# Patient Record
Sex: Male | Born: 1965 | Race: White | Hispanic: No | Marital: Single | Smoking: Current every day smoker
Health system: Southern US, Community
[De-identification: ages and names within clinical notes are randomized; demographics above are authoritative.]

## PROBLEM LIST (undated history)

## (undated) DIAGNOSIS — L03116 Cellulitis of left lower limb: Secondary | ICD-10-CM

## (undated) DIAGNOSIS — F8189 Other developmental disorders of scholastic skills: Secondary | ICD-10-CM

## (undated) DIAGNOSIS — R0902 Hypoxemia: Secondary | ICD-10-CM

## (undated) DIAGNOSIS — M6281 Muscle weakness (generalized): Secondary | ICD-10-CM

## (undated) DIAGNOSIS — M4712 Other spondylosis with myelopathy, cervical region: Secondary | ICD-10-CM

## (undated) DIAGNOSIS — M79609 Pain in unspecified limb: Secondary | ICD-10-CM

## (undated) DIAGNOSIS — J9600 Acute respiratory failure, unspecified whether with hypoxia or hypercapnia: Secondary | ICD-10-CM

## (undated) DIAGNOSIS — H1030 Unspecified acute conjunctivitis, unspecified eye: Secondary | ICD-10-CM

## (undated) DIAGNOSIS — G4730 Sleep apnea, unspecified: Secondary | ICD-10-CM

## (undated) DIAGNOSIS — I10 Essential (primary) hypertension: Secondary | ICD-10-CM

## (undated) DIAGNOSIS — L0390 Cellulitis, unspecified: Secondary | ICD-10-CM

## (undated) DIAGNOSIS — R41841 Cognitive communication deficit: Secondary | ICD-10-CM

## (undated) DIAGNOSIS — H1045 Other chronic allergic conjunctivitis: Secondary | ICD-10-CM

## (undated) DIAGNOSIS — R4182 Altered mental status, unspecified: Secondary | ICD-10-CM

## (undated) DIAGNOSIS — K563 Gallstone ileus: Secondary | ICD-10-CM

## (undated) DIAGNOSIS — E860 Dehydration: Secondary | ICD-10-CM

## (undated) DIAGNOSIS — E872 Acidosis, unspecified: Secondary | ICD-10-CM

## (undated) DIAGNOSIS — E539 Vitamin B deficiency, unspecified: Secondary | ICD-10-CM

## (undated) DIAGNOSIS — F89 Unspecified disorder of psychological development: Secondary | ICD-10-CM

## (undated) DIAGNOSIS — M479 Spondylosis, unspecified: Secondary | ICD-10-CM

## (undated) DIAGNOSIS — F17200 Nicotine dependence, unspecified, uncomplicated: Secondary | ICD-10-CM

## (undated) DIAGNOSIS — M47817 Spondylosis without myelopathy or radiculopathy, lumbosacral region: Secondary | ICD-10-CM

## (undated) DIAGNOSIS — E870 Hyperosmolality and hypernatremia: Secondary | ICD-10-CM

## (undated) DIAGNOSIS — G4733 Obstructive sleep apnea (adult) (pediatric): Secondary | ICD-10-CM

## (undated) DIAGNOSIS — L0291 Cutaneous abscess, unspecified: Secondary | ICD-10-CM

## (undated) DIAGNOSIS — J9601 Acute respiratory failure with hypoxia: Secondary | ICD-10-CM

## (undated) DIAGNOSIS — J449 Chronic obstructive pulmonary disease, unspecified: Secondary | ICD-10-CM

## (undated) DIAGNOSIS — G9340 Encephalopathy, unspecified: Secondary | ICD-10-CM

## (undated) HISTORY — DX: Essential (primary) hypertension: I10

## (undated) HISTORY — DX: Dehydration: E86.0

## (undated) HISTORY — DX: Vitamin B deficiency, unspecified: E53.9

## (undated) HISTORY — DX: Gallstone ileus: K56.3

## (undated) HISTORY — DX: Acidosis: E87.2

## (undated) HISTORY — DX: Hypoxemia: R09.02

## (undated) HISTORY — DX: Pain in unspecified limb: M79.609

## (undated) HISTORY — DX: Other chronic allergic conjunctivitis: H10.45

## (undated) HISTORY — DX: Cutaneous abscess, unspecified: L02.91

## (undated) HISTORY — DX: Encephalopathy, unspecified: G93.40

## (undated) HISTORY — DX: Muscle weakness (generalized): M62.81

## (undated) HISTORY — DX: Other developmental disorders of scholastic skills: F81.89

## (undated) HISTORY — DX: Unspecified acute conjunctivitis, unspecified eye: H10.30

## (undated) HISTORY — DX: Cellulitis, unspecified: L03.90

## (undated) HISTORY — DX: Hyperosmolality and hypernatremia: E87.0

## (undated) HISTORY — DX: Acute respiratory failure, unspecified whether with hypoxia or hypercapnia: J96.00

## (undated) HISTORY — DX: Nicotine dependence, unspecified, uncomplicated: F17.200

## (undated) HISTORY — DX: Acidosis, unspecified: E87.20

## (undated) HISTORY — DX: Cognitive communication deficit: R41.841

## (undated) HISTORY — DX: Altered mental status, unspecified: R41.82

## (undated) HISTORY — PX: EYE SURGERY: SHX253

---

## 2012-09-18 ENCOUNTER — Inpatient Hospital Stay (HOSPITAL_COMMUNITY)
Admission: EM | Admit: 2012-09-18 | Discharge: 2012-09-25 | DRG: 190 | Disposition: A | Discharge status: Skilled Nursing Facility | Payer: Medicaid Other | Attending: Internal Medicine | Admitting: Internal Medicine

## 2012-09-18 ENCOUNTER — Encounter (HOSPITAL_COMMUNITY): Payer: Self-pay | Admitting: Emergency Medicine

## 2012-09-18 DIAGNOSIS — I10 Essential (primary) hypertension: Secondary | ICD-10-CM | POA: Diagnosis present

## 2012-09-18 DIAGNOSIS — L03116 Cellulitis of left lower limb: Secondary | ICD-10-CM

## 2012-09-18 DIAGNOSIS — J9601 Acute respiratory failure with hypoxia: Secondary | ICD-10-CM

## 2012-09-18 DIAGNOSIS — L02419 Cutaneous abscess of limb, unspecified: Secondary | ICD-10-CM | POA: Diagnosis present

## 2012-09-18 DIAGNOSIS — J449 Chronic obstructive pulmonary disease, unspecified: Principal | ICD-10-CM | POA: Diagnosis present

## 2012-09-18 DIAGNOSIS — F17200 Nicotine dependence, unspecified, uncomplicated: Secondary | ICD-10-CM | POA: Diagnosis present

## 2012-09-18 DIAGNOSIS — K8020 Calculus of gallbladder without cholecystitis without obstruction: Secondary | ICD-10-CM | POA: Diagnosis present

## 2012-09-18 DIAGNOSIS — E870 Hyperosmolality and hypernatremia: Secondary | ICD-10-CM | POA: Diagnosis present

## 2012-09-18 DIAGNOSIS — F89 Unspecified disorder of psychological development: Secondary | ICD-10-CM

## 2012-09-18 DIAGNOSIS — H10029 Other mucopurulent conjunctivitis, unspecified eye: Secondary | ICD-10-CM | POA: Diagnosis present

## 2012-09-18 DIAGNOSIS — E872 Acidosis: Secondary | ICD-10-CM

## 2012-09-18 DIAGNOSIS — J4489 Other specified chronic obstructive pulmonary disease: Principal | ICD-10-CM | POA: Diagnosis present

## 2012-09-18 DIAGNOSIS — F88 Other disorders of psychological development: Secondary | ICD-10-CM | POA: Diagnosis present

## 2012-09-18 DIAGNOSIS — M79605 Pain in left leg: Secondary | ICD-10-CM

## 2012-09-18 DIAGNOSIS — R4182 Altered mental status, unspecified: Secondary | ICD-10-CM | POA: Diagnosis present

## 2012-09-18 DIAGNOSIS — J9600 Acute respiratory failure, unspecified whether with hypoxia or hypercapnia: Secondary | ICD-10-CM | POA: Diagnosis present

## 2012-09-18 DIAGNOSIS — G9340 Encephalopathy, unspecified: Secondary | ICD-10-CM | POA: Diagnosis present

## 2012-09-18 DIAGNOSIS — R0689 Other abnormalities of breathing: Secondary | ICD-10-CM

## 2012-09-18 DIAGNOSIS — D7589 Other specified diseases of blood and blood-forming organs: Secondary | ICD-10-CM | POA: Diagnosis present

## 2012-09-18 DIAGNOSIS — IMO0002 Reserved for concepts with insufficient information to code with codable children: Secondary | ICD-10-CM

## 2012-09-18 DIAGNOSIS — G4733 Obstructive sleep apnea (adult) (pediatric): Secondary | ICD-10-CM | POA: Diagnosis present

## 2012-09-18 NOTE — ED Notes (Signed)
[facility] presents with a 47 yo male from home with left leg pain.  [facility] reports pt has +5 pitting edema in left leg and pt. Hit leg 3 to 4 weeks ago.  No medical hx./surgical hx. Bilateral eye surgery in 20s.

## 2012-09-19 ENCOUNTER — Encounter (HOSPITAL_COMMUNITY): Payer: Self-pay | Admitting: Radiology

## 2012-09-19 ENCOUNTER — Emergency Department (HOSPITAL_COMMUNITY): Payer: Medicaid Other

## 2012-09-19 LAB — CBC
HCT: 48.7 % (ref 39.0–52.0)
Hemoglobin: 15.7 g/dL (ref 13.0–17.0)
RBC: 4.65 MIL/uL (ref 4.22–5.81)
RDW: 14.2 % (ref 11.5–15.5)
WBC: 10.5 10*3/uL (ref 4.0–10.5)

## 2012-09-19 LAB — BASIC METABOLIC PANEL
BUN: 14 mg/dL (ref 6–23)
CO2: 32 mEq/L (ref 19–32)
Chloride: 109 mEq/L (ref 96–112)
GFR calc non Af Amer: 74 mL/min — ABNORMAL LOW (ref >90)
Glucose, Bld: 107 mg/dL — ABNORMAL HIGH (ref 70–99)
Potassium: 4.6 mEq/L (ref 3.5–5.1)
Sodium: 146 mEq/L — ABNORMAL HIGH (ref 135–145)

## 2012-09-19 LAB — BLOOD GAS, ARTERIAL
Acid-Base Excess: 4.2 mmol/L — ABNORMAL HIGH (ref 0.0–2.0)
Bicarbonate: 30.7 mEq/L — ABNORMAL HIGH (ref 20.0–24.0)
Patient temperature: 98.6
pO2, Arterial: 74.9 mmHg — ABNORMAL LOW (ref 80.0–100.0)

## 2012-09-19 LAB — CBC WITH DIFFERENTIAL/PLATELET
Eosinophils Absolute: 0.1 10*3/uL (ref 0.0–0.7)
Hemoglobin: 15.3 g/dL (ref 13.0–17.0)
Lymphocytes Relative: 25 % (ref 12–46)
Lymphs Abs: 2.4 10*3/uL (ref 0.7–4.0)
MCH: 33.1 pg (ref 26.0–34.0)
Monocytes Relative: 8 % (ref 3–12)
Neutro Abs: 6.5 10*3/uL (ref 1.7–7.7)
Neutrophils Relative %: 66 % (ref 43–77)
Platelets: 224 10*3/uL (ref 150–400)
RBC: 4.62 MIL/uL (ref 4.22–5.81)
WBC: 9.9 10*3/uL (ref 4.0–10.5)

## 2012-09-19 LAB — POCT I-STAT 3, ART BLOOD GAS (G3+)
Acid-Base Excess: 2 mmol/L (ref 0.0–2.0)
O2 Saturation: 93 %
TCO2: 34 mmol/L (ref 0–100)
pCO2 arterial: 72.5 mmHg (ref 35.0–45.0)
pO2, Arterial: 81 mmHg (ref 80.0–100.0)

## 2012-09-19 LAB — URINALYSIS, ROUTINE W REFLEX MICROSCOPIC
Glucose, UA: NEGATIVE mg/dL
Leukocytes, UA: NEGATIVE
Protein, ur: NEGATIVE mg/dL
Specific Gravity, Urine: 1.036 — ABNORMAL HIGH (ref 1.005–1.030)

## 2012-09-19 LAB — URINE MICROSCOPIC-ADD ON

## 2012-09-19 LAB — RAPID URINE DRUG SCREEN, HOSP PERFORMED
Amphetamines: NOT DETECTED
Cocaine: NOT DETECTED
Opiates: NOT DETECTED

## 2012-09-19 LAB — CREATININE, SERUM
GFR calc Af Amer: 80 mL/min — ABNORMAL LOW (ref >90)
GFR calc non Af Amer: 69 mL/min — ABNORMAL LOW (ref >90)

## 2012-09-19 MED ORDER — SODIUM CHLORIDE 0.9 % IJ SOLN
3.0000 mL | Freq: Two times a day (BID) | INTRAMUSCULAR | Status: DC
  Administered 2012-09-19 – 2012-09-24 (×9): 3 mL via INTRAVENOUS

## 2012-09-19 MED ORDER — HEPARIN SODIUM (PORCINE) 5000 UNIT/ML IJ SOLN
5000.0000 [IU] | Freq: Three times a day (TID) | INTRAMUSCULAR | Status: DC
  Administered 2012-09-19 – 2012-09-25 (×18): 5000 [IU] via SUBCUTANEOUS
  Filled 2012-09-19 (×23): qty 1

## 2012-09-19 MED ORDER — ONDANSETRON HCL 4 MG/2ML IJ SOLN
INTRAMUSCULAR | Status: AC
  Filled 2012-09-19: qty 2

## 2012-09-19 MED ORDER — IPRATROPIUM BROMIDE 0.02 % IN SOLN: 0.5000 mg | RESPIRATORY_TRACT | Status: DC | PRN

## 2012-09-19 MED ORDER — CLINDAMYCIN PHOSPHATE 900 MG/50ML IV SOLN
900.0000 mg | Freq: Once | INTRAVENOUS | Status: AC
  Administered 2012-09-19: 900 mg via INTRAVENOUS
  Filled 2012-09-19: qty 50

## 2012-09-19 MED ORDER — IPRATROPIUM BROMIDE 0.02 % IN SOLN
0.5000 mg | RESPIRATORY_TRACT | Status: DC
  Administered 2012-09-19 (×4): 0.5 mg via RESPIRATORY_TRACT
  Filled 2012-09-19 (×4): qty 2.5

## 2012-09-19 MED ORDER — METHYLPREDNISOLONE SODIUM SUCC 125 MG IJ SOLR
60.0000 mg | INTRAMUSCULAR | Status: DC
  Administered 2012-09-19: 60 mg via INTRAVENOUS
  Filled 2012-09-19: qty 0.96
  Filled 2012-09-19: qty 2
  Filled 2012-09-19: qty 0.96

## 2012-09-19 MED ORDER — IPRATROPIUM BROMIDE 0.02 % IN SOLN
0.5000 mg | Freq: Four times a day (QID) | RESPIRATORY_TRACT | Status: DC
  Administered 2012-09-20 – 2012-09-21 (×6): 0.5 mg via RESPIRATORY_TRACT
  Filled 2012-09-19 (×6): qty 2.5

## 2012-09-19 MED ORDER — ONDANSETRON HCL 4 MG/2ML IJ SOLN: 4.0000 mg | Freq: Four times a day (QID) | INTRAMUSCULAR | Status: DC | PRN

## 2012-09-19 MED ORDER — VANCOMYCIN HCL 10 G IV SOLR
1500.0000 mg | Freq: Two times a day (BID) | INTRAVENOUS | Status: DC
  Administered 2012-09-19 – 2012-09-20 (×3): 1500 mg via INTRAVENOUS
  Filled 2012-09-19 (×5): qty 1500

## 2012-09-19 MED ORDER — ALBUTEROL SULFATE (5 MG/ML) 0.5% IN NEBU
2.5000 mg | INHALATION_SOLUTION | RESPIRATORY_TRACT | Status: DC
  Administered 2012-09-19 (×4): 2.5 mg via RESPIRATORY_TRACT
  Filled 2012-09-19 (×4): qty 0.5

## 2012-09-19 MED ORDER — HYDROMORPHONE HCL PF 1 MG/ML IJ SOLN
INTRAMUSCULAR | Status: AC
  Filled 2012-09-19: qty 1

## 2012-09-19 MED ORDER — ONDANSETRON HCL 4 MG PO TABS: 4.0000 mg | ORAL_TABLET | Freq: Four times a day (QID) | ORAL | Status: DC | PRN

## 2012-09-19 MED ORDER — ALBUTEROL SULFATE (5 MG/ML) 0.5% IN NEBU: 2.5000 mg | INHALATION_SOLUTION | RESPIRATORY_TRACT | Status: DC | PRN

## 2012-09-19 MED ORDER — IOHEXOL 350 MG/ML SOLN: 100.0000 mL | Freq: Once | INTRAVENOUS | Status: DC | PRN

## 2012-09-19 MED ORDER — ONDANSETRON HCL 4 MG/2ML IJ SOLN: 4.0000 mg | Freq: Three times a day (TID) | INTRAMUSCULAR | Status: DC | PRN

## 2012-09-19 MED ORDER — IOHEXOL 350 MG/ML SOLN
100.0000 mL | Freq: Once | INTRAVENOUS | Status: AC | PRN
  Administered 2012-09-19: 100 mL via INTRAVENOUS

## 2012-09-19 MED ORDER — PNEUMOCOCCAL VAC POLYVALENT 25 MCG/0.5ML IJ INJ
0.5000 mL | INJECTION | INTRAMUSCULAR | Status: AC
  Filled 2012-09-19: qty 0.5

## 2012-09-19 MED ORDER — HYDROMORPHONE HCL PF 1 MG/ML IJ SOLN
0.5000 mg | Freq: Once | INTRAMUSCULAR | Status: AC
  Administered 2012-09-19: 0.5 mg via INTRAVENOUS
  Filled 2012-09-19: qty 1

## 2012-09-19 MED ORDER — ALBUTEROL SULFATE (5 MG/ML) 0.5% IN NEBU
2.5000 mg | INHALATION_SOLUTION | Freq: Four times a day (QID) | RESPIRATORY_TRACT | Status: DC
  Administered 2012-09-20 – 2012-09-21 (×6): 2.5 mg via RESPIRATORY_TRACT
  Filled 2012-09-19 (×6): qty 0.5

## 2012-09-19 MED ORDER — VANCOMYCIN HCL IN DEXTROSE 1-5 GM/200ML-% IV SOLN
1000.0000 mg | Freq: Once | INTRAVENOUS | Status: AC
  Administered 2012-09-19: 1000 mg via INTRAVENOUS
  Filled 2012-09-19: qty 200

## 2012-09-19 MED ORDER — SODIUM CHLORIDE 0.9 % IV SOLN
INTRAVENOUS | Status: DC
  Administered 2012-09-19: 1000 mL via INTRAVENOUS
  Administered 2012-09-19 – 2012-09-20 (×2): via INTRAVENOUS

## 2012-09-19 NOTE — ED Notes (Signed)
Vascular lab called and said they could not perform the test due to the patient not being cooperative.  I contacted Dr. Rhona Leavens and advised him of same.

## 2012-09-19 NOTE — ED Notes (Addendum)
Pt. To CT. This nurse to accompany

## 2012-09-19 NOTE — H&P (Signed)
[name] is an 47 y.o. male.   Chief Complaint: Unable to obtain as pt is obtunded HPI: Cannot obtain from the patient given his mental status. There is no prior history available on the system. The H and P is obtained per ED records. Briefly, the pt is a 47yo male who presents to the ED with complaints of left leg pain. Pt did receive multiple does of narcotics for pain. Pt was later noted to be more obtunded in the ED with tachycardia and desaturation to the 80's. CT head and chest were unremarkable. ABG noted to have pH of 7.2 with pCO2 of mid 70's.  History reviewed. No pertinent past medical history.  Past Surgical History  Procedure Laterality Date  . Eye surgery      History reviewed. No pertinent family history. Social History:  reports that he has been smoking.  He has never used smokeless tobacco. He reports that he does not drink alcohol or use illicit drugs.  Allergies: No Known Allergies   (Not in a hospital admission)  Results for orders placed during the hospital encounter of 09/18/12 (from the past 48 hour(s))  CBC WITH DIFFERENTIAL     Status: Abnormal   Collection Time    09/19/12  3:44 AM      Result Value Range   WBC 9.9  4.0 - 10.5 K/uL   RBC 4.62  4.22 - 5.81 MIL/uL   Hemoglobin 15.3  13.0 - 17.0 g/dL   HCT 40.9  81.1 - 91.4 %   MCV 103.5 (*) 78.0 - 100.0 fL   MCH 33.1  26.0 - 34.0 pg   MCHC 32.0  30.0 - 36.0 g/dL   RDW 78.2  95.6 - 21.3 %   Platelets 224  150 - 400 K/uL   Neutrophils Relative % 66  43 - 77 %   Neutro Abs 6.5  1.7 - 7.7 K/uL   Lymphocytes Relative 25  12 - 46 %   Lymphs Abs 2.4  0.7 - 4.0 K/uL   Monocytes Relative 8  3 - 12 %   Monocytes Absolute 0.8  0.1 - 1.0 K/uL   Eosinophils Relative 1  0 - 5 %   Eosinophils Absolute 0.1  0.0 - 0.7 K/uL   Basophils Relative 0  0 - 1 %   Basophils Absolute 0.0  0.0 - 0.1 K/uL  BASIC METABOLIC PANEL     Status: Abnormal   Collection Time    09/19/12  3:44 AM      Result Value Range    Sodium 146 (*) 135 - 145 mEq/L   Potassium 4.6  3.5 - 5.1 mEq/L   Chloride 109  96 - 112 mEq/L   CO2 32  19 - 32 mEq/L   Glucose, Bld 107 (*) 70 - 99 mg/dL   BUN 14  6 - 23 mg/dL   Creatinine, Ser 0.86  0.50 - 1.35 mg/dL   Calcium 9.1  8.4 - 57.8 mg/dL   GFR calc non Af Amer 74 (*) >90 mL/min   GFR calc Af Amer 85 (*) >90 mL/min   Comment:            The eGFR has been calculated     using the CKD EPI equation.     This calculation has not been     validated in all clinical     situations.     eGFR's persistently     <90 mL/min signify  possible Chronic Kidney Disease.  D-DIMER, QUANTITATIVE     Status: Abnormal   Collection Time    09/19/12  3:44 AM      Result Value Range   D-Dimer, Quant 1.46 (*) 0.00 - 0.48 ug/mL-FEU   Comment:            AT THE INHOUSE ESTABLISHED CUTOFF     VALUE OF 0.48 ug/mL FEU,     THIS ASSAY HAS BEEN DOCUMENTED     IN THE LITERATURE TO HAVE     A SENSITIVITY AND NEGATIVE     PREDICTIVE VALUE OF AT LEAST     98 TO 99%.  THE TEST RESULT     SHOULD BE CORRELATED WITH     AN ASSESSMENT OF THE CLINICAL     PROBABILITY OF DVT / VTE.  POCT I-STAT 3, BLOOD GAS (G3+)     Status: Abnormal   Collection Time    09/19/12  8:58 AM      Result Value Range   pH, Arterial 7.255 (*) 7.350 - 7.450   pCO2 arterial 72.5 (*) 35.0 - 45.0 mmHg   pO2, Arterial 81.0  80.0 - 100.0 mmHg   Bicarbonate 32.1 (*) 20.0 - 24.0 mEq/L   TCO2 34  0 - 100 mmol/L   O2 Saturation 93.0     Acid-Base Excess 2.0  0.0 - 2.0 mmol/L   Patient temperature 98.6 F     Collection site RADIAL, ALLEN'S TEST ACCEPTABLE     Drawn by Operator     Sample type ARTERIAL     Comment NOTIFIED PHYSICIAN     Ct Head Wo Contrast  09/19/2012   *RADIOLOGY REPORT*  Clinical Data: Pain point pupils, right pupil dilatation, history of eye surgery  CT HEAD WITHOUT CONTRAST  Technique:  Contiguous axial images were obtained from the base of the skull through the vertex without contrast.  Comparison:  None.  Findings:  Examination is degraded secondary to patient motion artifact necessitating the acquisition of additional images.  The gray-white differentiation appears preserved.  No CT evidence of acute large territory infarct.  No intraparenchymal or extra- axial mass or hemorrhage.  Normal size and configuration of the ventricles and basilar cisterns.  Incidental note is made of a cavum septum pellucidum.  No midline shift.  There is underpneumatization of the bilateral frontal sinuses.  The mucosal polyp is seen within the left maxillary sinus. The remaining paranasal sinuses and mastoid air cells are normally aerated.  No air fluid levels.  The regional soft tissues are normal.  No displaced calvarial fracture.  Post bilateral cataract surgery.  IMPRESSION:  Degraded examination without acute intracranial process.   Original Report Authenticated By: Tacey Ruiz, MD   Ct Angio Chest Pe W/cm &/or Wo Cm  09/19/2012   *RADIOLOGY REPORT*  Clinical Data: Tachycardia, hypoxia, elevated D-dimer, evaluate for pulmonary embolism  CT ANGIOGRAPHY CHEST  Technique:  Multidetector CT imaging of the chest using the standard protocol during bolus administration of intravenous contrast. Multiplanar reconstructed images including MIPs were obtained and reviewed to evaluate the vascular anatomy.  Contrast: OMNIPAQUE IOHEXOL 350 MG/ML SOLN  Comparison: None.  Vascular Findings:  There is suboptimal opacification of the pulmonary arterial system of the main pulmonary artery measuring only 227 HU. Given this limitation, there are no discrete filling defects within the pulmonary arterial tree to the level of the bilateral sub segmental pulmonary arteries.  Evaluation of the subsegmental pulmonary arteries is degraded secondary to  a combination of suboptimal vessel opacification, patient body habitus and respiratory artifact.  Normal caliber of the main pulmonary artery.  Borderline cardiomegaly.  No pericardial effusion.   Normal caliber of the thoracic aorta.  Bovine configuration of the aortic arch. No definite periaortic stranding.  Nonvascular findings:  Evaluation of the pulmonary parenchyma is degraded secondary to patient respiratory artifact. There is minimal segmental atelectasis involving the medial and basilar segments of the bilateral lower lobes.  There is minimal linear heterogeneous and ground-glass opacities most conspicuous within the superior segment of the right lower lobe anterior segment of the lingula favored to represent atelectasis. No focal airspace opacities.  No pleural effusion or pneumothorax.  There is mild thickening of the segmental bronchi, most conspicuous within the bilateral lower lobes. Given limitations of the examination, there are no discrete underlying pulmonary nodules.  There is a solitary borderline enlarged mediastinal lymph node adjacent to the aortic arch measuring approximately 1.1 cm in grade short axis diameter (image 22, series nine). There is a shoddy bilateral hilar lymph node which measures approximately 9 mm in diameter (right hilar node - image 97, series 11; left infrahilar node - image 102) which are not enlarged by CT criteria.  Limited early arterial phase evaluation of the upper abdomen demonstrates a sub centimeter hypoattenuating lesion within the dome of the left lobe of the liver (image 57, series nine) which in the absence of a known primary malignancy likely represents a hepatic cyst.  A punctate (2-3 mm) gallstone is seen within the neck of a mildly distended but otherwise normal appearing gallbladder (image 64) though note, the entirety of the gallbladder is not imaged on this examination.  No acute or aggressive osseous abnormalities.  IMPRESSION:  1.  Multifactorial examination degradation without evidence of pulmonary embolism to the level of the bilateral subsegmental pulmonary arteries. 2.  Mild thickening of the segmental bronchi, is within the bilateral lower  lobes, nonspecific but could be seen in the setting of airways disease/bronchitis.  3.  A punctate gallstone is noted within the neck of a mildly distended but otherwise normal-appearing gallbladder, note however, the entirety of the gallbladder was not imaged on this examination. If there is concern for acute cholecystitis, further evaluation with gallbladder ultrasound is recommended.   Original Report Authenticated By: Tacey Ruiz, MD    Review of Systems  Unable to perform ROS: mental acuity    Blood pressure 136/123, pulse 119, temperature 98.4 F (36.9 C), temperature source Oral, resp. rate 12, height 5\' 2"  (1.575 m), weight 90.719 kg (200 lb), SpO2 98.00%. Physical Exam   General: Pt obtunded, difficult to arouse Chest: decreased breath sounds B with expiratory wheezing CVS: Tachycardia, S1, S2 Neuro: 5/5 strength throughout, CN 2-12 grossly intact Skin: normal Extr: B LE edema, L>R Psych: unable to determine  Assessment/Plan 1) Hypercarbia: RT consulted. Will initiate BiPAP as tolerated. Will emperically add scheduled nebs and start solumedrol given wheezing on exam. 2) Bronchitis: Will start scheduled nebs and solumedrol. Cont O2 as needed and wean as tolerated 3) Elevated D-dimer: unclear etiology. Scans in ED are neg for PE. Will order LE dopplers to r/o DVT. 4) Leg pain: Unclear etiology. As above, will obtain LE dopplers to r/o DVT. Would avoid narcotics for now given hypercarbic state. 5) DVT prophylaxis: Heparin per protocol 6) Code status: unable to determine. For now, pt is FULL CODE.  Iman Orourke K 09/19/2012, 9:42 AM

## 2012-09-19 NOTE — ED Provider Notes (Signed)
Patient care assumed from [facility], [place] positive shift change. Patient presents for left lower extremity pain, swelling, and erythema times one week. Patient denied CP, SOB, and hx of blood clots. Physical exam completed by Jaci Carrel, PA-C significant for 2+ pitting edema bilaterally; patient neurovascularly intact. CBC, BMP, and d-dimer ordered.  D-dimer elevated to 1.46. Duplex ultrasound of the left lower extremity ordered for further evaluation.  Patient's mental status has become altered within the last 30 minutes. He is tachycardic to 114 and hypoxic to 87% on room air. Currently satting 94% on 2 L nasal cannula. CT angio chest ordered to evaluate for pulmonary embolism. Patient currently has a rectal temperature of 99.5. CT head without contrast ordered to further assess AMS.  Patient signed out to [facility] at shift change for dispo.   Results for orders placed during the hospital encounter of 09/18/12  CBC WITH DIFFERENTIAL      Result Value Range   WBC 9.9  4.0 - 10.5 K/uL   RBC 4.62  4.22 - 5.81 MIL/uL   Hemoglobin 15.3  13.0 - 17.0 g/dL   HCT 16.1  09.6 - 04.5 %   MCV 103.5 (*) 78.0 - 100.0 fL   MCH 33.1  26.0 - 34.0 pg   MCHC 32.0  30.0 - 36.0 g/dL   RDW 40.9  81.1 - 91.4 %   Platelets 224  150 - 400 K/uL   Neutrophils Relative % 66  43 - 77 %   Neutro Abs 6.5  1.7 - 7.7 K/uL   Lymphocytes Relative 25  12 - 46 %   Lymphs Abs 2.4  0.7 - 4.0 K/uL   Monocytes Relative 8  3 - 12 %   Monocytes Absolute 0.8  0.1 - 1.0 K/uL   Eosinophils Relative 1  0 - 5 %   Eosinophils Absolute 0.1  0.0 - 0.7 K/uL   Basophils Relative 0  0 - 1 %   Basophils Absolute 0.0  0.0 - 0.1 K/uL  BASIC METABOLIC PANEL      Result Value Range   Sodium 146 (*) 135 - 145 mEq/L   Potassium 4.6  3.5 - 5.1 mEq/L   Chloride 109  96 - 112 mEq/L   CO2 32  19 - 32 mEq/L   Glucose, Bld 107 (*) 70 - 99 mg/dL   BUN 14  6 - 23 mg/dL   Creatinine, Ser 7.82  0.50 - 1.35 mg/dL   Calcium 9.1  8.4 -  95.6 mg/dL   GFR calc non Af Amer 74 (*) >90 mL/min   GFR calc Af Amer 85 (*) >90 mL/min  D-DIMER, QUANTITATIVE      Result Value Range   D-Dimer, Quant 1.46 (*) 0.00 - 0.48 ug/mL-FEU     Antony Madura, PA-C 09/19/12 [phone]

## 2012-09-19 NOTE — ED Notes (Signed)
This nurse went to talk to PA about patient status again. No change in patient status since last time this nurse checked on him.

## 2012-09-19 NOTE — ED Notes (Signed)
Pt. Continues to try to pull out IV and off oxygen. Pt. Will say "okay I understand" but then continue to pull at wires. Pt. Will open eyes for short period of time and make observations such as "It' 6 o'clock", and then become lethargic and confused.

## 2012-09-19 NOTE — ED Provider Notes (Signed)
7:14 AM Patient signed out to me by Antony Madura, PA-C. Patient presents with leg swelling and altered mental status. Patient is being evaluated for PE due to elevated d-dimer. Patient is pending a CT angio chest, CT head, and venous doppler of lower extremity. When the tests result, patient will be admitted.   10:45 AM CT of head and chest unremarkable for acute changes. Patient admitted to Dr. Rhona Leavens for altered mental status and lower extremity cellulitis.   Emilia Beck, PA-C 09/19/12 1045

## 2012-09-19 NOTE — ED Notes (Signed)
This nurse went in to assess pt. Pt. Very lethargic, hard to keep awake. Pinpoint pupils, right pupil more dilated then left. PA notified of pt status.

## 2012-09-19 NOTE — Progress Notes (Signed)
Pt transported on BiPAP 12/6, RR 8, 40%. Vitals WNL. No complications noted. RT Will monitor.

## 2012-09-19 NOTE — Progress Notes (Signed)
Pt admitted from ED, confused, on BiPap, condom cath applied, pt not following commands, bed alarm set

## 2012-09-19 NOTE — Progress Notes (Signed)
MD took pt of bipap for a break. Awaiting ABG to see if pt can stay off. As of now, pt on 4L [place] and tolerating well.

## 2012-09-19 NOTE — Progress Notes (Signed)
ANTIBIOTIC CONSULT NOTE - INITIAL  Pharmacy Consult for Vancomycin Indication: LLE cellulitis  No Known Allergies  Patient Measurements: Height: 5\' 2"  (157.5 cm) Weight: 200 lb (90.719 kg) IBW/kg (Calculated) : 54.6  Vital Signs: Temp: 98.6 F (37 C) (05/14 1639) Temp src: Axillary (05/14 1639) BP: 121/65 mmHg (05/14 1250) Pulse Rate: 119 (05/14 1342) Intake/Output from previous day:   Intake/Output from this shift: Total I/O In: 240 [P.O.:240] Out: -   Labs:  Recent Labs  09/19/12 0344 09/19/12 0915  WBC 9.9 10.5  HGB 15.3 15.7  PLT 224 233  CREATININE 1.17 1.23   Estimated Creatinine Clearance: 74 ml/min (by C-G formula based on Cr of 1.23). No results found for this basename: VANCOTROUGH, Leodis Binet, VANCORANDOM, GENTTROUGH, GENTPEAK, GENTRANDOM, TOBRATROUGH, TOBRAPEAK, TOBRARND, AMIKACINPEAK, AMIKACINTROU, AMIKACIN,  in the last 72 hours   Microbiology: Recent Results (from the past 720 hour(s))  MRSA PCR SCREENING     Status: None   Collection Time    09/19/12 11:46 AM      Result Value Range Status   MRSA by PCR NEGATIVE  NEGATIVE Final   Comment:            The GeneXpert MRSA Assay (FDA     approved for NASAL specimens     only), is one component of a     comprehensive MRSA colonization     surveillance program. It is not     intended to diagnose MRSA     infection nor to guide or     monitor treatment for     MRSA infections.    Medical History: History reviewed. No pertinent past medical history.  Medications:  No prescriptions prior to admission   Assessment: 47 y/o male patient admitted with left leg pain requiring vancomycin for cellulitis. Received 1g vanc at 0400 today in ED.  Goal of Therapy:  Vancomycin trough level 10-15 mcg/ml  Plan:  Vancomycin 1500mg  IV q12h and monitor renal function. Measure antibiotic drug levels at steady state Follow up culture results  Verlene Mayer, PharmD, BCPS Pager [phone] 09/19/2012,5:20  PM

## 2012-09-19 NOTE — Progress Notes (Signed)
VASCULAR LAB PRELIMINARY  PRELIMINARY  PRELIMINARY  PRELIMINARY  Left lower extremity venous duplex attempted.    Preliminary report:  Left lower extremity venous duplex attempted. This is totally inconclusive. Patient unable to tolerate the exam. Patient constantly bending the knee and trying to turn on his side. Importance of the test was discussed. Patient states he is sorry, but he just can't complete the exam after just getting started.  Vernida Mcnicholas, RVS 09/19/2012, 9:48 AM

## 2012-09-19 NOTE — Progress Notes (Signed)
Unit CM UR Completed by [facility] ED CM  W. Sharmarke Cicio RN  

## 2012-09-19 NOTE — Progress Notes (Signed)
Pt HR decreased into 40-50's, pt BP 110/48. HR went back up into 110's. NP Craige Cotta at bedside and aware, no new orders at this time, will continue to monitor.

## 2012-09-19 NOTE — ED Notes (Signed)
CT called and advised that patient has IV access in L AC.

## 2012-09-19 NOTE — ED Notes (Signed)
Walked into patient room. Pt. Still lethargic. Pt. Drooling with blood in it, suctioned patient mouth. Pt. Mental status alerted, unable to answer simple questions. Ammonia popped to help wake pt up. Pt. Sat up in bed saying "I need to pee", laid back in bed and became lethargic again. Pt. Uncooperative and confused with this nurse. Tresa Endo, [place] and Dr. Read Drivers at bedside. Rectal temp 99.5. Pt. Denying any complaints.

## 2012-09-19 NOTE — ED Notes (Signed)
Updated PA on patient status and plan of care.

## 2012-09-20 ENCOUNTER — Inpatient Hospital Stay (HOSPITAL_COMMUNITY): Payer: Medicaid Other

## 2012-09-20 DIAGNOSIS — G4733 Obstructive sleep apnea (adult) (pediatric): Secondary | ICD-10-CM

## 2012-09-20 DIAGNOSIS — E870 Hyperosmolality and hypernatremia: Secondary | ICD-10-CM | POA: Diagnosis present

## 2012-09-20 DIAGNOSIS — R6250 Unspecified lack of expected normal physiological development in childhood: Secondary | ICD-10-CM

## 2012-09-20 DIAGNOSIS — E872 Acidosis: Secondary | ICD-10-CM | POA: Diagnosis present

## 2012-09-20 DIAGNOSIS — G9340 Encephalopathy, unspecified: Secondary | ICD-10-CM | POA: Diagnosis present

## 2012-09-20 DIAGNOSIS — R0689 Other abnormalities of breathing: Secondary | ICD-10-CM | POA: Diagnosis present

## 2012-09-20 DIAGNOSIS — F89 Unspecified disorder of psychological development: Secondary | ICD-10-CM | POA: Diagnosis present

## 2012-09-20 DIAGNOSIS — J9602 Acute respiratory failure with hypercapnia: Secondary | ICD-10-CM

## 2012-09-20 DIAGNOSIS — L03116 Cellulitis of left lower limb: Secondary | ICD-10-CM

## 2012-09-20 DIAGNOSIS — K8020 Calculus of gallbladder without cholecystitis without obstruction: Secondary | ICD-10-CM | POA: Diagnosis present

## 2012-09-20 DIAGNOSIS — M79609 Pain in unspecified limb: Secondary | ICD-10-CM

## 2012-09-20 DIAGNOSIS — J9600 Acute respiratory failure, unspecified whether with hypoxia or hypercapnia: Secondary | ICD-10-CM

## 2012-09-20 DIAGNOSIS — R4182 Altered mental status, unspecified: Secondary | ICD-10-CM | POA: Diagnosis present

## 2012-09-20 DIAGNOSIS — J9601 Acute respiratory failure with hypoxia: Secondary | ICD-10-CM | POA: Diagnosis present

## 2012-09-20 DIAGNOSIS — M79605 Pain in left leg: Secondary | ICD-10-CM | POA: Diagnosis present

## 2012-09-20 HISTORY — DX: Obstructive sleep apnea (adult) (pediatric): G47.33

## 2012-09-20 HISTORY — DX: Unspecified disorder of psychological development: F89

## 2012-09-20 HISTORY — DX: Acidosis: E87.2

## 2012-09-20 HISTORY — DX: Acute respiratory failure with hypercapnia: J96.02

## 2012-09-20 HISTORY — DX: Acute respiratory failure with hypoxia: J96.01

## 2012-09-20 HISTORY — DX: Cellulitis of left lower limb: L03.116

## 2012-09-20 LAB — COMPREHENSIVE METABOLIC PANEL
ALT: 24 U/L (ref 0–53)
Alkaline Phosphatase: 100 U/L (ref 39–117)
CO2: 33 mEq/L — ABNORMAL HIGH (ref 19–32)
Chloride: 113 mEq/L — ABNORMAL HIGH (ref 96–112)
GFR calc Af Amer: 86 mL/min — ABNORMAL LOW (ref >90)
GFR calc non Af Amer: 75 mL/min — ABNORMAL LOW (ref >90)
Glucose, Bld: 107 mg/dL — ABNORMAL HIGH (ref 70–99)
Potassium: 4.8 mEq/L (ref 3.5–5.1)
Sodium: 150 mEq/L — ABNORMAL HIGH (ref 135–145)
Total Bilirubin: 0.2 mg/dL — ABNORMAL LOW (ref 0.3–1.2)

## 2012-09-20 LAB — CBC WITH DIFFERENTIAL/PLATELET
Basophils Absolute: 0 10*3/uL (ref 0.0–0.1)
Basophils Relative: 0 % (ref 0–1)
Eosinophils Absolute: 0 10*3/uL (ref 0.0–0.7)
MCH: 33.2 pg (ref 26.0–34.0)
MCHC: 31.4 g/dL (ref 30.0–36.0)
Neutro Abs: 8.1 10*3/uL — ABNORMAL HIGH (ref 1.7–7.7)
Neutrophils Relative %: 80 % — ABNORMAL HIGH (ref 43–77)
RDW: 14.3 % (ref 11.5–15.5)

## 2012-09-20 LAB — BLOOD GAS, ARTERIAL
Bicarbonate: 31.9 mEq/L — ABNORMAL HIGH (ref 20.0–24.0)
Delivery systems: POSITIVE
FIO2: 0.4 %
O2 Saturation: 88.2 %
Patient temperature: 98.4
TCO2: 34.2 mmol/L (ref 0–100)
pH, Arterial: 7.252 — ABNORMAL LOW (ref 7.350–7.450)
pO2, Arterial: 58.2 mmHg — ABNORMAL LOW (ref 80.0–100.0)

## 2012-09-20 LAB — LACTIC ACID, PLASMA: Lactic Acid, Venous: 1.1 mmol/L (ref 0.5–2.2)

## 2012-09-20 MED ORDER — NALOXONE HCL 0.4 MG/ML IJ SOLN: 0.4000 mg | INTRAMUSCULAR | Status: DC | PRN

## 2012-09-20 MED ORDER — NALOXONE HCL 0.4 MG/ML IJ SOLN
INTRAMUSCULAR | Status: AC
  Administered 2012-09-20: 0.4 mg
  Filled 2012-09-20: qty 1

## 2012-09-20 MED ORDER — SODIUM CHLORIDE 0.45 % IV SOLN
INTRAVENOUS | Status: DC
  Administered 2012-09-20: 1000 mL via INTRAVENOUS
  Administered 2012-09-21: via INTRAVENOUS

## 2012-09-20 MED ORDER — HYDROMORPHONE HCL PF 1 MG/ML IJ SOLN: 0.5000 mg | INTRAMUSCULAR | Status: DC | PRN

## 2012-09-20 MED ORDER — FOLIC ACID 1 MG PO TABS
1.0000 mg | ORAL_TABLET | Freq: Every day | ORAL | Status: DC
  Administered 2012-09-20 – 2012-09-25 (×6): 1 mg via ORAL
  Filled 2012-09-20 (×6): qty 1

## 2012-09-20 NOTE — Progress Notes (Signed)
Pt ABG done 09/20/2012 at 03:49. Pt on BiPAP IPAP 12 EPAP 6 at 40%.  PH 7.235 CO2 75.1 PO2 91.0 HCO3 30.7  RN notified at 04:05 Lacey Hitt -RBV-

## 2012-09-20 NOTE — Progress Notes (Signed)
[facility] Progress Note [name] TEAM 1 - Stepdown/ICU [name] Waltz ZOX:[id number] DOB: July 21, 1965 DOA: 09/18/2012 PCP: No primary provider on file.  Brief narrative: 47 year old male patient with only known medical history of hypertension. He presented to the emergency department because of swelling redness and pain in left leg. After arrival he received narcotic pain medications. Later he was noted to be more tender than the ER with tachycardia and oxygen desaturations to 80%. CT of the head and chest were unremarkable for any acute problems. ABG demonstrated respiratory acidosis with a pH of 7.2 and a PCO2 in the mid 70s. He was admitted to the step down unit for BiPAP was initiated. After admission family communicated with the staff and apparently patient's mother recently died and family has a send care for this patient. He was also scheduled for an outpatient polysomnogram due to concerns of possible sleep apnea.  Assessment/Plan: Active Problems:   Acute respiratory failure with hypoxia and Hypercapnia due to :  A) Suspected OSA (obstructive sleep apnea)  B)  Narcotic induced mental alteration -was given Narcan in ER but hypercapnia persisted- after add'l Narcan 3300 pt MS and resp effort improved significantly -cont BiPAP and has been weaned to 3 L -use CPAP at HS -appreciate PCCM assistance- so far has avoided intubation -needs PSG after dc and likely will need to be dc'd with CPAP at HS -avoid sedating meds    Acute respiratory acidosis -due to hypercapnia as above- since awake will not pursue add'l ABG's    Encephalopathy acute -due to narcs then hypercarbia- resolving    Left leg pain/Cellulitis of left lower extremity  -2nd venous duplex shows no DVT and CT Chest shows no PE -PCT WNL so likely mild superficial cellulitis -cont empiric Clinda and Vanco but consider narrow to monotherapy -Followup echo to ensure no cardiac etiology to edema   Dehydration with hypernatremia  -cont IVF and agree with change fluids to 1/2 NS- follow lytes    Elevated MCV -check anemia panel and TSH    ? Developmental disability -keep in touch with family-was liviing alone in apt pre admit - d/w [name] SW who will clarify if pt truly carries DD dx and what their desires are for dc planning -family had requested ? dc to "ALF" but group home may be more appropriate for this pt    Gallstone (impacted) -LFT's normal and no apparent abd pain- follow   DVT prophylaxis: SCDs Code Status: Full Family Communication: Patient only noting staff and social worker have spoken to the family and patient recently admitted within the past 12 hours and admitting physician has ordered a spoken to family Disposition Plan: Stepdown Isolation: None  Consultants: Pulmonary critical care medicine  Procedures: Portable abdominal ultrasound: Exam limited by bowel gas and body habitus, distended gallbladder without gallstones  Lower extremity venous duplex: No DVT in either extremity  2D echocardiogram: Pending  Antibiotics: Vancomycin 5/15 >>> Clindamycin 5/15 >>>  HPI/Subjective: Earlier this a.m. patient was sedated but easily awakened and able to answer questions appropriately given his apparent developmental delay. After Narcan administered patient completely alert with improved respiratory effort. No complaints from the patient except he is thirsty and would like to eat   Objective: Blood pressure 134/75, pulse 108, temperature 98 F (36.7 C), temperature source Oral, resp. rate 19, height 5\' 2"  (1.575 m), weight 90.719 kg (200 lb), SpO2 98.00%.  Intake/Output Summary (Last 24 hours) at 09/20/12 1514 Last data filed at 09/20/12  1500  Gross per 24 hour  Intake   2365 ml  Output    725 ml  Net   1640 ml     Exam: General: No acute respiratory distress after given repeat dose of Lungs: Clear to auscultation bilaterally without wheezes or crackles,  BiPAP with 40% FiO2 has been weaned to 3 L nasal cannula Cardiovascular: Regular rate and rhythm without murmur gallop or rub normal S1 and S2, focal peripheral edema of lower extremity primarily foot and ankle with associated cellulitic skin changes but no JVD Abdomen: Nontender, nondistended, soft, bowel sounds positive, no rebound, no ascites, no appreciable mass Musculoskeletal: No significant cyanosis, clubbing of bilateral lower extremities Neurological: Alert and oriented x 3 given suspected baseline developmental delay, moves all extremities x 4 without focal neurological deficits, CN 2-12 intact  Data Reviewed: Basic Metabolic Panel:  Recent Labs Lab 09/19/12 0344 09/19/12 0915 09/20/12 0525  NA 146*  --  150*  K 4.6  --  4.8  CL 109  --  113*  CO2 32  --  33*  GLUCOSE 107*  --  107*  BUN 14  --  16  CREATININE 1.17 1.23 1.16  CALCIUM 9.1  --  9.0   Liver Function Tests:  Recent Labs Lab 09/20/12 0525  AST 18  ALT 24  ALKPHOS 100  BILITOT 0.2*  PROT 6.4  ALBUMIN 3.2*   No results found for this basename: LIPASE, AMYLASE,  in the last 168 hours  Recent Labs Lab 09/20/12 0754  AMMONIA 37   CBC:  Recent Labs Lab 09/19/12 0344 09/19/12 0915 09/20/12 0525  WBC 9.9 10.5 10.1  NEUTROABS 6.5  --  8.1*  HGB 15.3 15.7 13.7  HCT 47.8 48.7 43.6  MCV 103.5* 104.7* 105.6*  PLT 224 233 219   Cardiac Enzymes: No results found for this basename: CKTOTAL, CKMB, CKMBINDEX, TROPONINI,  in the last 168 hours BNP (last 3 results) No results found for this basename: PROBNP,  in the last 8760 hours CBG: No results found for this basename: GLUCAP,  in the last 168 hours  Recent Results (from the past 240 hour(s))  MRSA PCR SCREENING     Status: None   Collection Time    09/19/12 11:46 AM      Result Value Range Status   MRSA by PCR NEGATIVE  NEGATIVE Final   Comment:            The GeneXpert MRSA Assay (FDA     approved for NASAL specimens     only), is one  component of a     comprehensive MRSA colonization     surveillance program. It is not     intended to diagnose MRSA     infection nor to guide or     monitor treatment for     MRSA infections.     Studies:  Recent x-ray studies have been reviewed in detail by the Attending Physician  Scheduled Meds:  Reviewed in detail by the Attending Physician   [name], [name] [facility] Office  [phone] Pager [phone]  On-Call/Text Page:      [name]      password [email or web address]  If 7PM-7AM, please contact night-coverage [email or web address] Password [facility] 09/20/2012, 3:14 PM   LOS: 2 days   I have examined the patient, reviewed the chart and modified the above note which I agree with.   Joshua Freeman,MD [phone] 09/20/2012, 4:47 PM

## 2012-09-20 NOTE — Progress Notes (Signed)
VASCULAR LAB PRELIMINARY  PRELIMINARY  PRELIMINARY  PRELIMINARY  Bilateral lower extremity venous duplex  completed.    Preliminary report:  Bilateral:  No evidence of DVT, superficial thrombosis, or Baker's Cyst.    Shareen Capwell, RVT 09/20/2012, 11:32 AM

## 2012-09-20 NOTE — Progress Notes (Signed)
Clinical Social Worker received phone call from NP indicating pt has "a history of Development Disabilities".  NP requested CSW to speak with sister to inquire about needs at dc.  CSW left message with pt's sister.      Angelia Mould, MSW, [place] [phone]

## 2012-09-20 NOTE — Progress Notes (Signed)
Per ABG, RT increased BIPAP settings to 16/8 40%. Pt still pulling low volumes unless stimulated. RT Will continue to monitor.

## 2012-09-20 NOTE — Progress Notes (Signed)
Clinical Social Worker received referral for "ALF".  CSW reviewed chart and staffed case with RNCM.  Per report, pt is from home alone and has a sister who is involved with decision making.  CSW to sign off at this time, please re consult if needed.    Angelia Mould, [place] [phone]

## 2012-09-20 NOTE — Consult Note (Signed)
PULMONARY  / CRITICAL CARE MEDICINE  Name: [name] MRN: [id number] DOB: 08/30/1965    ADMISSION DATE:  09/18/2012 CONSULTATION DATE:  09/20/12  REFERRING MD :  Dr. Butler Denmark PRIMARY SERVICE: TRH  CHIEF COMPLAINT:  Respiratory Failure  BRIEF PATIENT DESCRIPTION:  47 y/o M admitted with LLE pain & swelling.  Received narcotics in ER and had depressed mental status requiring bipap for hypercarbic / hypoxic respiratory failure.   SIGNIFICANT EVENTS / STUDIES:  5/14 - LE Doppler > poor study due to pain, veins visualized without clot 5/14 - CT Chest > exam degradation, no PE, no focal airspace disease, bronchial thickening, small gallstone in otherwise nml gallbladder  LINES / TUBES:   CULTURES: 5/14 MRSA PCR>>>neg  ANTIBIOTICS: clinda 5/14 >>  vanco 5/14 >>   HISTORY OF PRESENT ILLNESS:  47 y/o M, smoker,  with PMH of mental delay (functional level of approx 47 y/o) and bilateral cataract surgery who presented to [facility] ER with complaints of LLE pain and swelling.  He reports he hit his leg 3-4 weeks ago but does not remember injury.  In ER was found to be tachycardic, hypoxic with sats in 80's on RA, with altered mental status.  CT head was performed and negative.  Reportedly received narcotics in ER (but unable to find documentation of such??).  CTA Chest completed with no evidence of PE but poor exam, no acute infiltrate.      PAST MEDICAL HISTORY :  History reviewed. No pertinent past medical history. Past Surgical History  Procedure Laterality Date  . Eye surgery      Prior to Admission medications   Not on File   No Known Allergies  FAMILY HISTORY:  History reviewed. No pertinent family history. SOCIAL HISTORY:  reports that he has been smoking.  He has never used smokeless tobacco. He reports that he does not drink alcohol or use illicit drugs.  REVIEW OF SYSTEMS:  Unable to complete as pt is on bipap.   SUBJECTIVE:   VITAL SIGNS: Temp:  [97.3 F (36.3  C)-98.8 F (37.1 C)] 98.4 F (36.9 C) (05/15 0659) Pulse Rate:  [63-121] 78 (05/15 0757) Resp:  [13-21] 16 (05/15 0757) BP: (107-151)/(48-109) 135/72 mmHg (05/15 0751) SpO2:  [91 %-99 %] 96 % (05/15 0757) FiO2 (%):  [40 %] 40 % (05/15 0751) HEMODYNAMICS:   VENTILATOR SETTINGS: Vent Mode:  [-]  FiO2 (%):  [40 %] 40 % INTAKE / OUTPUT: Intake/Output     05/14 0701 - 05/15 0700 05/15 0701 - 05/16 0700   P.O. 240    I.V. (mL/kg) 975 (10.7)    IV Piggyback 550    Total Intake(mL/kg) 1765 (19.5)    Urine (mL/kg/hr) 725 (0.3)    Total Output 725 0   Net +1040 0        Urine Occurrence 751 x      PHYSICAL EXAMINATION: General:  wdwn adult male in NAD Neuro:  AAOx4, drowsy but arouses and is appropriate HEENT:  Mm pink/dry, face mask in place Cardiovascular:  s1s2 distant, regular Lungs:  resp's even/non-labored, lungs bilaterally clear Abdomen:  Round/soft, bsx4 active Musculoskeletal:  No acute deformities  Skin:  Warm/dry, LLE swollen, warm, mild erythema, old abrasion / laceration that is well healed on anterior LE  LABS:  Recent Labs Lab 09/19/12 0344 09/19/12 0858 09/19/12 0915 09/19/12 1712 09/20/12 0525 09/20/12 0756  HGB 15.3  --  15.7  --  13.7  --   WBC 9.9  --  10.5  --  10.1  --   PLT 224  --  233  --  219  --   NA 146*  --   --   --  150*  --   K 4.6  --   --   --  4.8  --   CL 109  --   --   --  113*  --   CO2 32  --   --   --  33*  --   GLUCOSE 107*  --   --   --  107*  --   BUN 14  --   --   --  16  --   CREATININE 1.17  --  1.23  --  1.16  --   CALCIUM 9.1  --   --   --  9.0  --   AST  --   --   --   --  18  --   ALT  --   --   --   --  24  --   ALKPHOS  --   --   --   --  100  --   BILITOT  --   --   --   --  0.2*  --   PROT  --   --   --   --  6.4  --   ALBUMIN  --   --   --   --  3.2*  --   PHART  --  7.255*  --  7.266*  --  7.252*  PCO2ART  --  72.5*  --  69.7*  --  74.9*  PO2ART  --  81.0  --  74.9*  --  58.2*    CXR: 5/15 - low lung  volumes but no acute infiltrate  ASSESSMENT / PLAN:  PULMONARY A: Acute Hypoxic / Hypercarbic Respiratory Failure - in setting of probable undiagnosed OSA, smoking hx.  CXR / CT chest clear.  Presumed OSA  P:   -narcan now, suspect narcs a contributor to decompensation -oxygen to support sats 90-94% -PRN BiPAP for respiratory support -NPO until mental status more alert / awake then clears  -d/c steroids  -recommend sleep study as outpatient -continue BD's   CARDIOVASCULAR A: Tachycardia - resolved  P: -tele montior  RENAL A:   Hypernatremia  Hyperchloremia   P:   -change IV to 1/2 NS   GASTROINTESTINAL A:   Gallstone - isolated gallstone noted on CTA Chest, otherwise nml gallbladder (what was visualized)  P:   -ABD [place] per primary  HEMATOLOGIC A:   Macrocytosis - pt denies ETOH, likely b12/folate deficiency Concern for LLE DVT - swelling, pain, erythema.  Initial doppler with limited exam but negative.   P:  -add folate -check TSH -repeat LE Doppler, still concerning for DVT (currently untreated)  INFECTIOUS A:   Suspected LLE Cellulitis  P:   -continue abx as ordered for now -follow PCT  ENDOCRINE A:   Mild Hyperglycemia    P:   -monitor, if consistently > 150, consider SSI  NEUROLOGIC A:   Pain - may be difficult to manage in terms of pain due to respiratory status. Acute Encephalopathy   P:   -narcan x1 -supportive care   I have personally obtained a history, examined the patient, evaluated laboratory and imaging results, formulated the assessment and plan and placed orders.  CRITICAL CARE: The patient is critically ill with multiple organ systems failure and requires high  complexity decision making for assessment and support, frequent evaluation and titration of therapies, application of advanced monitoring technologies and extensive interpretation of multiple databases. Critical Care Time devoted to patient care services described in  this note is 60 minutes.    Levy Pupa, MD, PhD 09/20/2012, 9:46 AM [facility] [phone] or if no answer [phone]

## 2012-09-20 NOTE — Progress Notes (Signed)
Clinical Social Work Department BRIEF PSYCHOSOCIAL ASSESSMENT 09/20/2012  Patient:  ABDULMALIK, [name]     Account Number:  [id number]     Admit date:  09/18/2012  Clinical Social Worker:  Margaree Mackintosh  Date/Time:  09/20/2012 04:25 PM  Referred by:  Physician  Date Referred:  09/20/2012 Referred for  Other - See comment   Other Referral:   Interview type:  Family Other interview type:    PSYCHOSOCIAL DATA Living Status:  ALONE Admitted from facility:   Level of care:   Primary support name:  [name] [phone] Primary support relationship to patient:  SIBLING Degree of support available:   Adequate.    CURRENT CONCERNS Current Concerns  Post-Acute Placement   Other Concerns:    SOCIAL WORK ASSESSMENT / PLAN Clinical Social Worker received referral to inquire about history.  CSW spoke with pt's sister.  Sister reports pt has never received a "Develomental Disability diagnosis" and that "He's (pt) is just slower with things".  Sister states she helps pt out by purchasing his groceries and seeing him every other day.  Sister states she plans to see pt every day post hospitalization to "Check on him". Sister reports no concerns or complaints related to pt's current living environment.  Only current concern is "he doesn't sleep well, he has a sleep study appointment on 5/19".  CSW provided number to [facility] DSS to sister. Sister states she will phone DSS to inquire any potential programs pt may qualify for. Sister stated, "I want him to be as independent as possible".  CSW updated RNCM.  CSW to sign off, please re consult if needed.   Assessment/plan status:   Other assessment/ plan:   Information/referral to community resources:   [facility] DSS    PATIENT'S/FAMILY'S RESPONSE TO PLAN OF CARE: Sister thanked CSW for intervention.

## 2012-09-20 NOTE — Progress Notes (Signed)
CRITICAL VALUE ALERT  Critical value received:  Ph 7.235, PCO2 75.1, PO2 91 Bicarb 30.7  Date of notification:  09/20/2012  Time of notification:  0415  Critical value read back:yes  Nurse who received alert:  L Hitt RN  MD notified (1st page):  Craige Cotta NP  Time of first page:  0420  MD notified (2nd page):  Time of second page:  Responding MD:  Craige Cotta  Time MD responded:

## 2012-09-21 DIAGNOSIS — J9600 Acute respiratory failure, unspecified whether with hypoxia or hypercapnia: Secondary | ICD-10-CM

## 2012-09-21 LAB — COMPREHENSIVE METABOLIC PANEL
AST: 18 U/L (ref 0–37)
Albumin: 3.1 g/dL — ABNORMAL LOW (ref 3.5–5.2)
Alkaline Phosphatase: 101 U/L (ref 39–117)
BUN: 17 mg/dL (ref 6–23)
Chloride: 107 mEq/L (ref 96–112)
Potassium: 4.2 mEq/L (ref 3.5–5.1)
Total Bilirubin: 0.3 mg/dL (ref 0.3–1.2)

## 2012-09-21 LAB — RETICULOCYTES: Retic Ct Pct: 1.4 % (ref 0.4–3.1)

## 2012-09-21 LAB — CBC
HCT: 44.4 % (ref 39.0–52.0)
Platelets: 222 10*3/uL (ref 150–400)
RDW: 14.1 % (ref 11.5–15.5)
WBC: 11.9 10*3/uL — ABNORMAL HIGH (ref 4.0–10.5)

## 2012-09-21 LAB — IRON AND TIBC
Iron: 59 ug/dL (ref 42–135)
TIBC: 285 ug/dL (ref 215–435)

## 2012-09-21 LAB — FOLATE: Folate: 15.8 ng/mL

## 2012-09-21 LAB — VITAMIN B12: Vitamin B-12: 221 pg/mL (ref 211–911)

## 2012-09-21 LAB — TSH: TSH: 2.472 u[IU]/mL (ref 0.350–4.500)

## 2012-09-21 MED ORDER — CYANOCOBALAMIN 1000 MCG/ML IJ SOLN
1000.0000 ug | Freq: Every day | INTRAMUSCULAR | Status: DC
  Filled 2012-09-21 (×2): qty 1

## 2012-09-21 MED ORDER — ACETAMINOPHEN 325 MG PO TABS
650.0000 mg | ORAL_TABLET | Freq: Four times a day (QID) | ORAL | Status: DC | PRN
  Administered 2012-09-21: 650 mg via ORAL
  Filled 2012-09-21: qty 2

## 2012-09-21 MED ORDER — CYANOCOBALAMIN 1000 MCG/ML IJ SOLN
1000.0000 ug | Freq: Every day | INTRAMUSCULAR | Status: DC
  Administered 2012-09-22 – 2012-09-23 (×2): 1000 ug via SUBCUTANEOUS
  Filled 2012-09-21 (×2): qty 1

## 2012-09-21 MED ORDER — ALBUTEROL SULFATE (5 MG/ML) 0.5% IN NEBU
2.5000 mg | INHALATION_SOLUTION | Freq: Four times a day (QID) | RESPIRATORY_TRACT | Status: DC
  Administered 2012-09-21 – 2012-09-25 (×14): 2.5 mg via RESPIRATORY_TRACT
  Filled 2012-09-21 (×14): qty 0.5

## 2012-09-21 MED ORDER — IBUPROFEN 400 MG PO TABS
400.0000 mg | ORAL_TABLET | Freq: Four times a day (QID) | ORAL | Status: DC | PRN
  Administered 2012-09-22: 400 mg via ORAL
  Filled 2012-09-21: qty 1

## 2012-09-21 MED ORDER — ALBUTEROL SULFATE (5 MG/ML) 0.5% IN NEBU
2.5000 mg | INHALATION_SOLUTION | RESPIRATORY_TRACT | Status: DC
  Administered 2012-09-21: 2.5 mg via RESPIRATORY_TRACT
  Filled 2012-09-21: qty 0.5

## 2012-09-21 MED ORDER — IPRATROPIUM BROMIDE 0.02 % IN SOLN
0.5000 mg | RESPIRATORY_TRACT | Status: DC
  Administered 2012-09-21: 0.5 mg via RESPIRATORY_TRACT
  Filled 2012-09-21: qty 2.5

## 2012-09-21 MED ORDER — PNEUMOCOCCAL VAC POLYVALENT 25 MCG/0.5ML IJ INJ
0.5000 mL | INJECTION | INTRAMUSCULAR | Status: AC
  Administered 2012-09-22: 0.5 mL via INTRAMUSCULAR
  Filled 2012-09-21 (×2): qty 0.5

## 2012-09-21 MED ORDER — IPRATROPIUM BROMIDE 0.02 % IN SOLN
0.5000 mg | Freq: Four times a day (QID) | RESPIRATORY_TRACT | Status: DC
  Administered 2012-09-21 – 2012-09-25 (×14): 0.5 mg via RESPIRATORY_TRACT
  Filled 2012-09-21 (×14): qty 2.5

## 2012-09-21 MED ORDER — DOXYCYCLINE HYCLATE 100 MG PO TABS
100.0000 mg | ORAL_TABLET | Freq: Two times a day (BID) | ORAL | Status: DC
  Administered 2012-09-21 – 2012-09-25 (×9): 100 mg via ORAL
  Filled 2012-09-21 (×11): qty 1

## 2012-09-21 MED ORDER — BUDESONIDE 0.25 MG/2ML IN SUSP
0.2500 mg | Freq: Two times a day (BID) | RESPIRATORY_TRACT | Status: DC
  Administered 2012-09-21 – 2012-09-25 (×9): 0.25 mg via RESPIRATORY_TRACT
  Filled 2012-09-21 (×11): qty 2

## 2012-09-21 NOTE — Progress Notes (Signed)
PULMONARY  / CRITICAL CARE MEDICINE  Name: [name] MRN: [id number] DOB: 1965/12/31    ADMISSION DATE:  09/18/2012 CONSULTATION DATE:  09/20/12  REFERRING MD :  Dr. Butler Denmark PRIMARY SERVICE: TRH  CHIEF COMPLAINT:  Respiratory Failure  BRIEF PATIENT DESCRIPTION:  47 y/o M admitted with LLE pain & swelling.  Received narcotics in ER and had depressed mental status requiring bipap for hypercarbic / hypoxic respiratory failure.   SIGNIFICANT EVENTS / STUDIES:  5/14 - LE Doppler > poor study due to pain, veins visualized without clot 5/14 - CT Chest > exam degradation, no PE, no focal airspace disease, bronchial thickening, small gallstone in otherwise nml gallbladder  LINES / TUBES:   CULTURES: 5/14 MRSA PCR>>>neg  ANTIBIOTICS: clinda 5/14 >>  vanco 5/14 >>   HISTORY OF PRESENT ILLNESS:  47 y/o M, smoker,  with PMH of mental delay (functional level of approx 47 y/o) and bilateral cataract surgery who presented to [facility] ER with complaints of LLE pain and swelling.  He reports he hit his leg 3-4 weeks ago but does not remember injury.  In ER was found to be tachycardic, hypoxic with sats in 80's on RA, with altered mental status.  CT head was performed and negative.  Reportedly received narcotics in ER (but unable to find documentation of such??).  CTA Chest completed with no evidence of PE but poor exam, no acute infiltrate.     SUBJECTIVE:  Much improved, did not require biPAP last night  VITAL SIGNS: Temp:  [97.4 F (36.3 C)-98.7 F (37.1 C)] 97.4 F (36.3 C) (05/16 0730) Pulse Rate:  [94-108] 102 (05/16 0730) Resp:  [15-20] 16 (05/16 0730) BP: (101-153)/(55-83) 153/83 mmHg (05/16 0730) SpO2:  [92 %-99 %] 99 % (05/16 0838) HEMODYNAMICS:   VENTILATOR SETTINGS:   INTAKE / OUTPUT: Intake/Output     05/15 0701 - 05/16 0700 05/16 0701 - 05/17 0700   P.O. 960 360   I.V. (mL/kg) 1070.8 (11.8)    IV Piggyback     Total Intake(mL/kg) 2030.8 (22.4) 360 (4)   Urine  (mL/kg/hr) 250 (0.1) 350 (1.1)   Total Output 250 350   Net +1780.8 +10        Urine Occurrence 2575 x      PHYSICAL EXAMINATION: General:  wdwn adult male in NAD Neuro:  AAOx4, awake and is appropriate HEENT:  Mm pink/dry,  Cardiovascular:  s1s2 distant, regular Lungs:  resp's even/non-labored, some B exp wheezes Abdomen:  Round/soft, bsx4 active Musculoskeletal:  No acute deformities  Skin:  Warm/dry, LLE swollen, warm, mild erythema, old abrasion / laceration that is well healed on anterior LE  LABS:  Recent Labs Lab 09/19/12 0344 09/19/12 0858 09/19/12 0915 09/19/12 1712 09/20/12 0525 09/20/12 0756 09/20/12 0759 09/20/12 1508 09/21/12 0436  HGB 15.3  --  15.7  --  13.7  --   --   --  14.1  WBC 9.9  --  10.5  --  10.1  --   --   --  11.9*  PLT 224  --  233  --  219  --   --   --  222  NA 146*  --   --   --  150*  --   --   --  145  K 4.6  --   --   --  4.8  --   --   --  4.2  CL 109  --   --   --  113*  --   --   --  107  CO2 32  --   --   --  33*  --   --   --  28  GLUCOSE 107*  --   --   --  107*  --   --   --  94  BUN 14  --   --   --  16  --   --   --  17  CREATININE 1.17  --  1.23  --  1.16  --   --   --  1.14  CALCIUM 9.1  --   --   --  9.0  --   --   --  9.3  AST  --   --   --   --  18  --   --   --  18  ALT  --   --   --   --  24  --   --   --  20  ALKPHOS  --   --   --   --  100  --   --   --  101  BILITOT  --   --   --   --  0.2*  --   --   --  0.3  PROT  --   --   --   --  6.4  --   --   --  6.7  ALBUMIN  --   --   --   --  3.2*  --   --   --  3.1*  LATICACIDVEN  --   --   --   --   --   --   --  1.1  --   PROCALCITON  --   --   --   --   --   --  <0.10  --   --   PHART  --  7.255*  --  7.266*  --  7.252*  --   --   --   PCO2ART  --  72.5*  --  69.7*  --  74.9*  --   --   --   PO2ART  --  81.0  --  74.9*  --  58.2*  --   --   --     CXR: 5/15 - low lung volumes but no acute infiltrate  ASSESSMENT / PLAN:  PULMONARY A: Acute Hypoxic /  Hypercarbic Respiratory Failure - in setting of probable undiagnosed OSA, smoking hx + narcotics; responded to narcan Presumed OSA Presumed COPD  P:   -oxygen to support sats 90-94% -CPAP qhs while hospitalized -recommend sleep study as outpatient -continue BD's; would be reasonable to start spiriva or at least albuterol prn prior to d/c home -needs smoking cessation -Pulm f/u >> w Dr Shelle Iron June 10 at 2:30pm  CARDIOVASCULAR A: Tachycardia - resolved  P: -tele montior  RENAL A:   Hypernatremia  Hyperchloremia   P:   -1/2 NS   GASTROINTESTINAL A:   Gallstone - isolated gallstone noted on CTA Chest, otherwise nml gallbladder (what was visualized)  P:   -ABD [place] per primary  HEMATOLOGIC A:   Macrocytosis - pt denies ETOH, likely b12/folate deficiency Concern for LLE DVT - swelling, pain, erythema.  Initial doppler with limited exam but negative.   P:  -add folate -check TSH -repeat LE Doppler, still concerning for DVT (currently untreated)  INFECTIOUS A:   Suspected LLE Cellulitis, no dvt on  LE doppler 5/15 (preliminary)  P:   -continue abx as ordered for now -follow PCT  ENDOCRINE A:   Mild Hyperglycemia    P:   -monitor, if consistently > 150, consider SSI  NEUROLOGIC A:   Pain -  Acute Encephalopathy > resolved  P:    I have personally obtained a history, examined the patient, evaluated laboratory and imaging results, formulated the assessment and plan and placed orders.  Please call if we can help you.   Levy Pupa, MD, PhD 09/21/2012, 10:27 AM [facility] [phone] or if no answer [phone]

## 2012-09-21 NOTE — Progress Notes (Signed)
  Echocardiogram 2D Echocardiogram has been performed.  [name] 09/21/2012, 12:44 PM

## 2012-09-21 NOTE — Progress Notes (Signed)
Pt tx 5500 per md order, pt VSS, pt tol wel, pt verbalized understanding of tx, all questions answered, report called to receiving RN, all questions answered

## 2012-09-21 NOTE — Evaluation (Signed)
Occupational Therapy Evaluation Patient Details Name: [name] MRN: [id number] DOB: 10/07/1965 Today's Date: 09/21/2012 Time: 0865-7846 OT Time Calculation (min): 28 min  OT Assessment / Plan / Recommendation Clinical Impression  This 47 yo male admitted with AMS, LLE pain, bronchitis, respiratory failure and history of being "slow" presents to acute OT with problems below. Will benefit from acute OT with follow up OT at SNF.    OT Assessment  Patient needs continued OT Services    Follow Up Recommendations  SNF (Short term)    Barriers to Discharge Decreased caregiver support    Equipment Recommendations  None recommended by OT       Frequency  Min 2X/week    Precautions / Restrictions Precautions Precautions: Fall Restrictions Weight Bearing Restrictions: No   Pertinent Vitals/Pain 96% on 4liters O2 and HR 108    ADL  Eating/Feeding: Simulated;Independent Where Assessed - Eating/Feeding: Edge of bed Grooming: Simulated;Set up;Brushing hair;Supervision/safety Where Assessed - Grooming: Unsupported sitting Upper Body Bathing: Simulated;Set up;Supervision/safety Where Assessed - Upper Body Bathing: Unsupported sitting Lower Body Bathing: Simulated;Minimal assistance Where Assessed - Lower Body Bathing: Supported sit to stand Upper Body Dressing: Simulated;Set up;Supervision/safety Where Assessed - Upper Body Dressing: Unsupported sitting Lower Body Dressing: Simulated;Minimal assistance Where Assessed - Lower Body Dressing: Supported sit to stand Toilet Transfer: Simulated;Minimal assistance Toilet Transfer Method: Sit to Barista:  (Recliner>5 steps forward and back>recliner) Toileting - Architect and Hygiene: Simulated;Minimal assistance Where Assessed - Engineer, mining and Hygiene: Standing Equipment Used:  (HHA) Transfers/Ambulation Related to ADLs: Min A for all ADL Comments: Dyspnea at rest and  increases with exertion    OT Diagnosis: Generalized weakness;Cognitive deficits  OT Problem List: Decreased strength;Decreased activity tolerance;Decreased safety awareness;Impaired balance (sitting and/or standing);Cardiopulmonary status limiting activity;Decreased knowledge of use of DME or AE OT Treatment Interventions: Self-care/ADL training;Balance training;Patient/family education;DME and/or AE instruction;Therapeutic activities;Cognitive remediation/compensation   OT Goals Acute Rehab OT Goals OT Goal Formulation: Patient unable to participate in goal setting Time For Goal Achievement: 10/05/12 Potential to Achieve Goals: Good ADL Goals Pt Will Perform Grooming: with supervision;Unsupported;Standing at sink ADL Goal: Grooming - Progress: Goal set today Pt Will Perform Upper Body Bathing: with supervision;Sitting at sink;Standing at sink;Unsupported ADL Goal: Upper Body Bathing - Progress: Goal set today Pt Will Perform Lower Body Bathing: with supervision;Sitting at sink;Standing at sink;Unsupported ADL Goal: Lower Body Bathing - Progress: Goal set today Pt Will Perform Upper Body Dressing: with supervision;Unsupported;Sit to stand from chair;Sit to stand from bed ADL Goal: Upper Body Dressing - Progress: Goal set today Pt Will Perform Lower Body Dressing: with supervision;Sit to stand from chair;Sit to stand from bed;Unsupported ADL Goal: Lower Body Dressing - Progress: Goal set today Pt Will Transfer to Toilet: with supervision;with DME;Regular height toilet;Comfort height toilet;3-in-1;Grab bars ADL Goal: Toilet Transfer - Progress: Goal set today Pt Will Perform Toileting - Clothing Manipulation: Independently;Standing ADL Goal: Toileting - Clothing Manipulation - Progress: Goal set today Pt Will Perform Toileting - Hygiene: Independently;Sit to stand from 3-in-1/toilet ADL Goal: Toileting - Hygiene - Progress: Goal set today Miscellaneous OT Goals Miscellaneous OT Goal #1:  Pt will be S to use purse lipped breathing prn during OT session OT Goal: Miscellaneous Goal #1 - Progress: Goal set today  Visit Information  Last OT Received On: 09/21/12 Assistance Needed: +1    Subjective Data  Subjective: I cannot stay in this hell hole until Monday   Prior Functioning     Home Living Lives  With: Alone Available Help at Discharge: Family;Available PRN/intermittently Type of Home: Apartment Home Access: Level entry Home Layout: One level Bathroom Shower/Tub: Engineer, manufacturing systems: Standard Home Adaptive Equipment: Grab bars in shower;Grab bars around toilet Prior Function Level of Independence: Independent Able to Take Stairs?: Yes Driving: No Vocation: On disability Communication Communication: No difficulties Dominant Hand: Right         Vision/Perception Vision - History Baseline Vision: No visual deficits Patient Visual Report: No change from baseline   Cognition  Cognition Arousal/Alertness: Awake/alert Behavior During Therapy: WFL for tasks assessed/performed Overall Cognitive Status: History of cognitive impairments - at baseline    Extremity/Trunk Assessment Right Upper Extremity Assessment RUE ROM/Strength/Tone: Within functional levels Left Upper Extremity Assessment LUE ROM/Strength/Tone: Within functional levels     Mobility Bed Mobility Details for Bed Mobility Assistance: Pt up in recliner upon arrival Transfers Transfers: Sit to Stand;Stand to Sit Sit to Stand: 4: Min assist;With upper extremity assist;With armrests;From chair/3-in-1 Stand to Sit: 4: Min assist;With upper extremity assist;With armrests;To chair/3-in-1           End of Session OT - End of Session Activity Tolerance: Patient limited by fatigue Patient left: in chair;with call bell/phone within reach Nurse Communication:  (NT: pt wanted to bath and shave)       Evette Georges [phone] 09/21/2012, 5:31 PM

## 2012-09-21 NOTE — Evaluation (Addendum)
Physical Therapy Evaluation Patient Details Name: [name] MRN: [id number] DOB: 1965-06-01 Today's Date: 09/21/2012 Time: 5784-6962 PT Time Calculation (min): 20 min  PT Assessment / Plan / Recommendation Clinical Impression  pt admitted with leg pain L LE and went into resp failure after pain meds given.  Pt continues on 4L O2, but managed for a short period off O2.  At rest ~1 min off O2, sats at 92% HR at 114bpm,  During gait, on RA sats maintained at 91% with dyspnea and EHR 128bpm, On return to room and at rest in chair sats returned to 95% on RA with mild dyspnea and HR113bpm.  Pt reports that what walking we did on eval would usually have made him SOB.  "I feel about like usual, I want to go home."    PT Assessment  Patient needs continued PT services    Follow Up Recommendations  SNF;Other (comment) (unless family able to be around until he recovers)    Does the patient have the potential to tolerate intense rehabilitation      Barriers to Discharge Decreased caregiver support      Equipment Recommendations  Other (comment);None recommended by PT (TBA  based on family info.)    Recommendations for Other Services     Frequency Min 3X/week    Precautions / Restrictions Precautions Precautions: Fall Restrictions Weight Bearing Restrictions: No   Pertinent Vitals/Pain       Mobility  Bed Mobility Bed Mobility: Not assessed Details for Bed Mobility Assistance: Pt up in recliner upon arrival Transfers Transfers: Sit to Stand;Stand to Sit Sit to Stand: With upper extremity assist;With armrests;From chair/3-in-1;4: Min guard Stand to Sit: 4: Min guard;To chair/3-in-1 Details for Transfer Assistance: min guard due to mild unsteadiness Ambulation/Gait Ambulation/Gait Assistance: 4: Min guard Ambulation Distance (Feet): 180 Feet (with 1 standing rest break) Assistive device: None (rail at times) Ambulation/Gait Assistance Details: gait characteized by unequal  loping gait with stooped posture Gait Pattern: Step-through pattern;Decreased step length - right;Decreased step length - left;Decreased stance time - left;Wide base of support Stairs: No    Exercises     PT Diagnosis: Abnormality of gait;Other (comment) (decr activity tolerance)  PT Problem List: Decreased activity tolerance;Decreased balance;Decreased mobility;Cardiopulmonary status limiting activity PT Treatment Interventions: Gait training;Functional mobility training;Therapeutic activities;Patient/family education   PT Goals Acute Rehab PT Goals PT Goal Formulation: With patient Time For Goal Achievement: 09/28/12 Potential to Achieve Goals: Good Pt will go Supine/Side to Sit: with modified independence;with HOB 0 degrees PT Goal: Supine/Side to Sit - Progress: Goal set today Pt will go Sit to Stand: with modified independence PT Goal: Sit to Stand - Progress: Goal set today Pt will Transfer Bed to Chair/Chair to Bed: with modified independence PT Transfer Goal: Bed to Chair/Chair to Bed - Progress: Goal set today Pt will Ambulate: >150 feet;with supervision PT Goal: Ambulate - Progress: Goal set today  Visit Information  Last PT Received On: 09/21/12 Assistance Needed: +1    Subjective Data  Subjective: If you let me go home you'll be awesome,. Patient Stated Goal: Get home   Prior Functioning  Home Living Lives With: Alone Available Help at Discharge: Family;Available PRN/intermittently Type of Home: Apartment Home Access: Level entry Home Layout: One level Bathroom Shower/Tub: Engineer, manufacturing systems: Standard Home Adaptive Equipment: Grab bars in shower;Grab bars around toilet Prior Function Level of Independence: Independent Able to Take Stairs?: Yes Driving: No Vocation: On disability Communication Communication: No difficulties Dominant Hand: Right  Cognition  Cognition Arousal/Alertness: Awake/alert Behavior During Therapy: WFL for tasks  assessed/performed Overall Cognitive Status: History of cognitive impairments - at baseline    Extremity/Trunk Assessment Right Upper Extremity Assessment RUE ROM/Strength/Tone: Within functional levels Left Upper Extremity Assessment LUE ROM/Strength/Tone: Within functional levels Right Lower Extremity Assessment RLE ROM/Strength/Tone: Within functional levels Left Lower Extremity Assessment LLE ROM/Strength/Tone: Within functional levels   Balance Balance Balance Assessed: Yes Static Standing Balance Static Standing - Balance Support: No upper extremity supported Static Standing - Level of Assistance: 5: Stand by assistance  End of Session PT - End of Session Equipment Utilized During Treatment: Other (comment) (oxygen on standby, but not used) Activity Tolerance: Patient tolerated treatment well;Other (comment) (limited by dyspnea) Patient left: in chair;with call bell/phone within reach Nurse Communication: Mobility status  GP     Elzie Sheets, Eliseo Gum 09/21/2012, 5:57 PM 09/21/2012  [place] Bing, PT [phone]  (pager)

## 2012-09-21 NOTE — Progress Notes (Signed)
Nasotracheal suctioning documentation done on wrong patient.

## 2012-09-21 NOTE — Care Management Note (Signed)
    Page 1 of 2   09/25/2012     3:27:06 PM   CARE MANAGEMENT NOTE 09/25/2012  Patient:  [name], [name]   Account Number:  [id number]  Date Initiated:  09/20/2012  Documentation initiated by:  Donn Pierini  Subjective/Objective Assessment:   Pt admitted with resp. failure and AMS- currently on BIPAP     Action/Plan:   PTA pt lived at home alone,   Anticipated DC Date:  09/25/2012   Anticipated DC Plan:  SKILLED NURSING FACILITY  In-house referral  Clinical Social Worker      DC Planning Services  CM consult      Choice offered to / List presented to:             Status of service:  Completed, signed off Medicare Important Message given?   (If response is "NO", the following Medicare IM given date fields will be blank) Date Medicare IM given:   Date Additional Medicare IM given:    Discharge Disposition:  SKILLED NURSING FACILITY  Per UR Regulation:  Reviewed for med. necessity/level of care/duration of stay  If discussed at Long Length of Stay Meetings, dates discussed:    Comments:  09/25/12 15:26 Letha Cape RN, BSN [phone] patient dc to snf today, CSW following.  09/21/12- 1040- Donn Pierini RN BSN [phone] Pt actually now shows having Medicaid and therefore will not be eligible for any medication assistance- Per CSW was informed that pt had a sleep study scheduled for May 19- call made to [facility] sleep center to confirm and was told that pt does not have a sleep study scheduled at this time- however he does have an appointment with the pulm MD for evaluation on June 10 at 2:30- it will be determined at that time if pt needs sleep study or not and arranged as outpt. NCM to continue to follow for d/c needs/planning. PT/OT evals ordered today and pending  09/20/12- 1550- Donn Pierini RN, BSN [phone] Referral received for potential medication needs and d/c planning- pt currently on BIPAP - NCM will f/u with pt when pt more alert and able to communicate better. Pt is  listed as having Medicaid.

## 2012-09-21 NOTE — Progress Notes (Signed)
[facility] Progress Note [place] TEAM 1 - Stepdown/ICU [name] WUJ:[id number] DOB: 1966/02/17 DOA: 09/18/2012 PCP: No primary provider on file.  Brief narrative: 47 year old male with only known medical history of hypertension. He presented to the emergency department because of swelling, redness, and pain in left leg. After arrival he received narcotic pain medications. Later he was noted to be more lethargic by the ER staff with tachycardia and oxygen desaturations to 80%. CT of the head and chest were unremarkable for any acute problems. ABG demonstrated respiratory acidosis with a pH of 7.2 and a PCO2 in the mid 70s. He was admitted to the step down unit for BiPAP. After admission family communicated with the staff that patient's mother recently died and family has assumed care of this patient. It was also reported that he had been scheduled for an outpatient polysomnogram due to concerns of possible sleep apnea.  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/ongoing tobaccoa use -was given Narcan in ER but hypercapnia persisted - after add'l Narcan in 3300 his mentation and resp effort improved significantly -CPAP at HS -has PSG scheduled as OP and will need CPAP at dc / case manager aware -avoid sedating meds / no narcs -appreciate pulmonary assistance - OP appt has been scheduled -cont albuterol nebs and add inhaled steroid as pt continues to wheeze  Encephalopathy acute -due to narcs then hypercarbia - resolved  Left leg pain/Cellulitis of left lower extremity  -venous duplex shows no DVT and CT Chest shows no PE -PCT WNL - not c/w severe infection -Initially empiric Clinda and Vanco - narrowed to Doxycycline monotherapy 5/16 -Followup echo to ensure no cardiac etiology to edema -adding Tylenol and Motrin for  pain  Dehydration with hypernatremia  -corrected w/ volume resuscitation  Macrocytosis -B12 <400 so will begin SQ replacement daily x 3 days then will f/u B12 check in 8-12 weeks  ? Developmental disability -family reports he does not carry this dx although they admit he's "a little slow"  Gallstone noted on CT chest  -LFT's normal and no apparent abd pain - follow clinically - f/u US not helpful   DVT prophylaxis: SCDs Code Status: Full Family Communication: Patient  Disposition Plan: Transfer to floor Isolation: None  Consultants: PCCM  Procedures: Portable abdominal ultrasound: Exam limited by bowel gas and body habitus, distended gallbladder without gallstones  Lower extremity venous duplex: No DVT in either extremity  2D echocardiogram: Pending  Antibiotics: Vancomycin 5/15 >>> 5/16 Clindamycin 5/15 >>> 5/16 Doxycycline 5/16 >>>  HPI/Subjective: Alert and wants to go home. Denies cp, sob, n/v, or abdom pain.    Objective: Blood pressure 153/83, pulse 102, temperature 99.2 F (37.3 C), temperature source Oral, resp. rate 16, height 5\' 2"  (1.575 m), weight 90.719 kg (200 lb), SpO2 90.00%.  Intake/Output Summary (Last 24 hours) at 09/21/12 1227 Last data filed at 09/21/12 1215  Gross per 24 hour  Intake 2150.83 ml  Output    900 ml  Net 1250.83 ml    Exam: General: No acute respiratory distress  Lungs: diffuse exp wheeze to auscultation bilaterally - no crackles Cardiovascular: Regular rate and rhythm without murmur gallop or rub normal S1 and S2, focal peripheral edema of lower extremity primarily foot and ankle with associated cellulitic skin changes but no JVD Abdomen: Nontender, nondistended, soft, bowel  sounds positive, no rebound, no ascites, no appreciable mass Musculoskeletal: No significant cyanosis, clubbing of bilateral lower extremities Neurological: Alert and oriented x 3 given suspected baseline developmental delay, moves all extremities x 4  without focal neurological deficits, CN 2-12 intact  Data Reviewed: Basic Metabolic Panel:  Recent Labs Lab 09/19/12 0344 09/19/12 0915 09/20/12 0525 09/21/12 0436  NA 146*  --  150* 145  K 4.6  --  4.8 4.2  CL 109  --  113* 107  CO2 32  --  33* 28  GLUCOSE 107*  --  107* 94  BUN 14  --  16 17  CREATININE 1.17 1.23 1.16 1.14  CALCIUM 9.1  --  9.0 9.3   Liver Function Tests:  Recent Labs Lab 09/20/12 0525 09/21/12 0436  AST 18 18  ALT 24 20  ALKPHOS 100 101  BILITOT 0.2* 0.3  PROT 6.4 6.7  ALBUMIN 3.2* 3.1*    Recent Labs Lab 09/20/12 0754  AMMONIA 37   CBC:  Recent Labs Lab 09/19/12 0344 09/19/12 0915 09/20/12 0525 09/21/12 0436  WBC 9.9 10.5 10.1 11.9*  NEUTROABS 6.5  --  8.1*  --   HGB 15.3 15.7 13.7 14.1  HCT 47.8 48.7 43.6 44.4  MCV 103.5* 104.7* 105.6* 103.7*  PLT 224 233 219 222    Recent Results (from the past 240 hour(s))  MRSA PCR SCREENING     Status: None   Collection Time    09/19/12 11:46 AM      Result Value Range Status   MRSA by PCR NEGATIVE  NEGATIVE Final   Comment:            The GeneXpert MRSA Assay (FDA     approved for NASAL specimens     only), is one component of a     comprehensive MRSA colonization     surveillance program. It is not     intended to diagnose MRSA     infection nor to guide or     monitor treatment for     MRSA infections.     Studies:  Recent x-ray studies have been reviewed in detail by the Attending Physician  Scheduled Meds:  Reviewed in detail by the Attending Physician   Junious Silk, ANP [facility] Office  [phone] Pager [phone]  On-Call/Text Page:      [email or web address]      password [email or web address]  If 7PM-7AM, please contact night-coverage [email or web address] Password [email or web address] 09/21/2012, 12:27 PM   LOS: 3 days   I have personally examined this patient and reviewed the entire database. I have reviewed the above note, made any necessary editorial changes, and agree with its  content.  Lonia Blood, MD [facility]

## 2012-09-21 NOTE — Progress Notes (Signed)
[name] [id number] Code Status: full  Admission Data: 09/21/2012 3:29 PM Attending Provider: Sharon Seller PCP:No primary provider on file. Consults/ Treatment Team:    [name] is a 47 y.o. male patient admitted from ED awake, alert - oriented  X 3 - no acute distress noted.  VSS - Blood pressure 138/85, pulse 115, temperature 99.2 F (37.3 C), temperature source Oral, resp. rate 15, height 5\' 2"  (1.575 m), weight 90.719 kg (200 lb), SpO2 93.00%.  no c/o shortness of breath, no c/o chest pain.  O2:   @ 4 l/min per minute. IV Fluids:  IV in place, occlusive dsg intact without redness, IV cath hand right, condition patent and no redness normal saline.  Allergies:  No Known Allergies   History reviewed. No pertinent past medical history. No prescriptions prior to admission   History:  obtained from the patient. Tobacco/alcohol: denied none  Orientation to room, and floor completed with information packet given to patient/family.  Patient declined safety video at this time.  Admission INP armband ID verified with patient/family, and in place.   SR up x 2, fall assessment complete, with patient and family able to verbalize understanding of risk associated with falls, and verbalized understanding to call nsg before up out of bed.  Call light within reach, patient able to voice, and demonstrate understanding.  Skin, clean-dry- intact without evidence of bruising, or skin tears.   No evidence of skin break down noted on exam.     Will cont to eval and treat per MD orders.  Orvan Seen, RN 09/21/2012 3:29 PM

## 2012-09-22 DIAGNOSIS — G9340 Encephalopathy, unspecified: Secondary | ICD-10-CM

## 2012-09-22 LAB — BLOOD GAS, ARTERIAL
Acid-Base Excess: 7 mmol/L — ABNORMAL HIGH (ref 0.0–2.0)
Bicarbonate: 32.6 mEq/L — ABNORMAL HIGH (ref 20.0–24.0)
O2 Saturation: 95.2 %
TCO2: 34.4 mmol/L (ref 0–100)
pO2, Arterial: 80 mmHg (ref 80.0–100.0)

## 2012-09-22 MED ORDER — FUROSEMIDE 10 MG/ML IJ SOLN
40.0000 mg | Freq: Two times a day (BID) | INTRAMUSCULAR | Status: DC
  Administered 2012-09-22 – 2012-09-23 (×2): 40 mg via INTRAVENOUS
  Filled 2012-09-22 (×4): qty 4

## 2012-09-22 MED ORDER — FUROSEMIDE 10 MG/ML IJ SOLN: 40.0000 mg | Freq: Once | INTRAMUSCULAR | Status: DC

## 2012-09-22 NOTE — Progress Notes (Signed)
[facility] Progress Note    [name] ZOX:[id number] DOB: 12/30/1965 DOA: 09/18/2012 PCP: No primary provider on file.  HPI/Subjective: Alert but seems slow. He denies any SOB. ABG shows CO2 retention  Brief narrative: 47 year old male with only known medical history of hypertension. He presented to the emergency department because of swelling, redness, and pain in left leg. After arrival he received narcotic pain medications. Later he was noted to be more lethargic by the ER staff with tachycardia and oxygen desaturations to 80%. CT of the head and chest were unremarkable for any acute problems. ABG demonstrated respiratory acidosis with a pH of 7.2 and a PCO2 in the mid 70s. He was admitted to the step down unit for BiPAP. After admission family communicated with the staff that patient's mother recently died and family has assumed care of this patient. It was also reported that he had been scheduled for an outpatient polysomnogram due to concerns of possible sleep apnea.  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 -Wean Oxygen to maintain sats around 90%  Encephalopathy acute -due to narcs then hypercarbia - resolved  Left leg pain/Cellulitis of left lower extremity  -venous duplex shows no DVT and CT Chest shows no PE -PCT WNL - not c/w severe infection -Initially empiric Clinda and Vanco - narrowed to Doxycycline monotherapy  5/16 -Followup echo to ensure no cardiac etiology to edema -adding Tylenol and Motrin for pain  Dehydration with hypernatremia  -corrected w/ volume resuscitation  Macrocytosis -B12 <400 so will begin SQ replacement daily x 3 days then will f/u B12 check in 8-12 weeks  ? Developmental disability -family reports he does not carry this dx although they admit he's "a little slow"  Gallstone noted on CT chest  -LFT's normal and no apparent abd pain - follow clinically - f/u US not helpful   DVT prophylaxis: SCDs Code Status: Full Family Communication: Patient  Disposition Plan: Transfer to floor Isolation: None  Consultants: PCCM, Signed off  Procedures: Portable abdominal ultrasound: Exam limited by bowel gas and body habitus, distended gallbladder without gallstones  Lower extremity venous duplex: No DVT in either extremity  2D echocardiogram: Pending  Antibiotics: Vancomycin 5/15 >>> 5/16 Clindamycin 5/15 >>> 5/16 Doxycycline 5/16 >>>    Objective: Blood pressure 137/95, pulse 113, temperature 99 F (37.2 C), temperature source Oral, resp. rate 16, height 5\' 2"  (1.575 m), weight 90.719 kg (200 lb), SpO2 95.00%.  Intake/Output Summary (Last 24 hours) at 09/22/12 1256 Last data filed at 09/22/12 8119  Gross per 24 hour  Intake   1873 ml  Output   1602 ml  Net    271 ml    Exam: General: No acute respiratory distress  Lungs: diffuse exp wheeze to auscultation bilaterally - no crackles Cardiovascular: Regular rate and rhythm without murmur gallop or rub normal S1 and S2, focal peripheral edema of lower extremity primarily foot and ankle with associated cellulitic skin changes but no  JVD Abdomen: Nontender, nondistended, soft, bowel sounds positive, no rebound, no ascites, no appreciable mass Musculoskeletal: No significant cyanosis, clubbing of bilateral lower extremities Neurological: Alert and oriented x 3 given suspected baseline developmental delay, moves all  extremities x 4 without focal neurological deficits, CN 2-12 intact  Data Reviewed: Basic Metabolic Panel:  Recent Labs Lab 09/19/12 0344 09/19/12 0915 09/20/12 0525 09/21/12 0436  NA 146*  --  150* 145  K 4.6  --  4.8 4.2  CL 109  --  113* 107  CO2 32  --  33* 28  GLUCOSE 107*  --  107* 94  BUN 14  --  16 17  CREATININE 1.17 1.23 1.16 1.14  CALCIUM 9.1  --  9.0 9.3   Liver Function Tests:  Recent Labs Lab 09/20/12 0525 09/21/12 0436  AST 18 18  ALT 24 20  ALKPHOS 100 101  BILITOT 0.2* 0.3  PROT 6.4 6.7  ALBUMIN 3.2* 3.1*    Recent Labs Lab 09/20/12 0754  AMMONIA 37   CBC:  Recent Labs Lab 09/19/12 0344 09/19/12 0915 09/20/12 0525 09/21/12 0436  WBC 9.9 10.5 10.1 11.9*  NEUTROABS 6.5  --  8.1*  --   HGB 15.3 15.7 13.7 14.1  HCT 47.8 48.7 43.6 44.4  MCV 103.5* 104.7* 105.6* 103.7*  PLT 224 233 219 222    Recent Results (from the past 240 hour(s))  MRSA PCR SCREENING     Status: None   Collection Time    09/19/12 11:46 AM      Result Value Range Status   MRSA by PCR NEGATIVE  NEGATIVE Final   Comment:            The GeneXpert MRSA Assay (FDA     approved for NASAL specimens     only), is one component of a     comprehensive MRSA colonization     surveillance program. It is not     intended to diagnose MRSA     infection nor to guide or     monitor treatment for     MRSA infections.     Studies:  Recent x-ray studies have been reviewed in detail by the Attending Physician  Scheduled Meds:  Reviewed in detail by the Attending Physician   [facility] Office  [phone] Pager [phone]

## 2012-09-22 NOTE — Progress Notes (Signed)
Pt does not wear a cpap.

## 2012-09-22 NOTE — Progress Notes (Signed)
PT refused his CPAP, said he couldn't sleep with it on. I told him to let the RN know if he changed his mind

## 2012-09-22 NOTE — Progress Notes (Signed)
CRITICAL VALUE ALERT  Critical value received:  CO2 61.6  Date of notification:  09/22/12  Time of notification:  0445  Critical value read back:yes  Nurse who received alert:  Marlaine Hind  MD notified (1st page):  Claiborne Billings  Time of first page:  0450  MD notified (2nd page):  Time of second page:  Responding MD:  Claiborne Billings  Time MD responded:  [phone]

## 2012-09-23 ENCOUNTER — Inpatient Hospital Stay (HOSPITAL_COMMUNITY): Payer: Medicaid Other

## 2012-09-23 DIAGNOSIS — E872 Acidosis: Secondary | ICD-10-CM

## 2012-09-23 LAB — CBC
HCT: 46.6 % (ref 39.0–52.0)
Hemoglobin: 15.1 g/dL (ref 13.0–17.0)
MCH: 33.3 pg (ref 26.0–34.0)
MCHC: 32.4 g/dL (ref 30.0–36.0)

## 2012-09-23 LAB — BASIC METABOLIC PANEL
BUN: 17 mg/dL (ref 6–23)
BUN: 17 mg/dL (ref 6–23)
CO2: 30 mEq/L (ref 19–32)
Calcium: 9.6 mg/dL (ref 8.4–10.5)
Chloride: 107 mEq/L (ref 96–112)
GFR calc non Af Amer: 76 mL/min — ABNORMAL LOW (ref >90)
Glucose, Bld: 95 mg/dL (ref 70–99)
Glucose, Bld: 102 mg/dL — ABNORMAL HIGH (ref 70–99)
Potassium: 4.4 mEq/L (ref 3.5–5.1)
Potassium: 4.4 mEq/L (ref 3.5–5.1)

## 2012-09-23 MED ORDER — OLOPATADINE HCL 0.1 % OP SOLN
1.0000 [drp] | Freq: Two times a day (BID) | OPHTHALMIC | Status: DC
  Administered 2012-09-23 – 2012-09-25 (×5): 1 [drp] via OPHTHALMIC
  Filled 2012-09-23: qty 5

## 2012-09-23 MED ORDER — NEOMYCIN-POLYMYXIN-DEXAMETH 3.5-10000-0.1 OP OINT
TOPICAL_OINTMENT | Freq: Every day | OPHTHALMIC | Status: DC
  Administered 2012-09-23 – 2012-09-24 (×7): via OPHTHALMIC
  Administered 2012-09-24: 1 via OPHTHALMIC
  Administered 2012-09-25 (×3): via OPHTHALMIC
  Filled 2012-09-23 (×3): qty 3.5

## 2012-09-23 MED ORDER — DEXTROSE 5 % IV SOLN
INTRAVENOUS | Status: DC
  Administered 2012-09-23: 13:00:00 via INTRAVENOUS

## 2012-09-23 MED ORDER — NEOMYCIN-BACITRACIN ZN-POLYMYX 5-400-10000 OP OINT
TOPICAL_OINTMENT | OPHTHALMIC | Status: DC
  Filled 2012-09-23: qty 3.5

## 2012-09-23 MED ORDER — CYANOCOBALAMIN 1000 MCG/ML IJ SOLN
1000.0000 ug | Freq: Every day | INTRAMUSCULAR | Status: DC
  Administered 2012-09-24 – 2012-09-25 (×2): 1000 ug via SUBCUTANEOUS
  Filled 2012-09-23 (×2): qty 1

## 2012-09-23 MED ORDER — FUROSEMIDE 10 MG/ML IJ SOLN
40.0000 mg | Freq: Three times a day (TID) | INTRAMUSCULAR | Status: DC
  Administered 2012-09-23 – 2012-09-24 (×3): 40 mg via INTRAVENOUS
  Filled 2012-09-23 (×4): qty 4

## 2012-09-23 NOTE — Progress Notes (Signed)
[facility] Progress Note    [name] ZOX:[id number] DOB: November 23, 1965 DOA: 09/18/2012 PCP: No primary provider on file.  HPI/Subjective: Alert but seems slow. He denies any SOB. Now on 1 L of oxygen. We'll try to wean to room air.  Brief narrative: 47 year old male with only known medical history of hypertension. He presented to the emergency department because of swelling, redness, and pain in left leg. After arrival he received narcotic pain medications. Later he was noted to be more lethargic by the ER staff with tachycardia and oxygen desaturations to 80%. CT of the head and chest were unremarkable for any acute problems. ABG demonstrated respiratory acidosis with a pH of 7.2 and a PCO2 in the mid 70s. He was admitted to the step down unit for BiPAP. After admission family communicated with the staff that patient's mother recently died and family has assumed care of this patient. It was also reported that he had been scheduled for an outpatient polysomnogram due to concerns of possible sleep apnea.  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/ongoing tobaccoa use -was given Narcan in ER but hypercapnia persisted - after add'l Narcan in 3300 his mentation and resp effort improved significantly -CPAP at HS -has PSG scheduled as OP and will need CPAP at dc / case manager aware -avoid sedating meds / no narcs -cont albuterol nebs and add inhaled steroid as pt continues to wheeze. -Wean Oxygen to maintain sats around 90%. -Even with normal LVEF on echo, chest x-ray showed vascular congestion I will diuresis with Lasix.  Encephalopathy acute -due to narcs then hypercarbia - resolved  Left leg pain/Cellulitis of left lower extremity  -venous duplex shows no DVT and CT Chest shows no PE -PCT WNL - not c/w severe infection -Initially  empiric Clinda and Vanco - narrowed to Doxycycline monotherapy 5/16 -Followup echo to ensure no cardiac etiology to edema -adding Tylenol and Motrin for pain  Dehydration with hypernatremia  -corrected w/ volume resuscitation  Macrocytosis -B12 <400 , B12 supplementation started.  ? Developmental disability -family reports he does not carry this dx although they admit he's "a little slow"  Gallstone noted on CT chest  -LFT's normal and no apparent abd pain - follow clinically - f/u US not helpful  Pink eye -I will start topical antibiotic.  DVT prophylaxis: SCDs Code Status: Full Family Communication: Patient  Disposition Plan: Transfer to floor Isolation: None  Consultants: PCCM, Signed off  Procedures: Portable abdominal ultrasound: Exam limited by bowel gas and body habitus, distended gallbladder without gallstones  Lower extremity venous duplex: No DVT in either extremity  2D echocardiogram: Pending  Antibiotics: Vancomycin 5/15 >>> 5/16 Clindamycin 5/15 >>> 5/16 Doxycycline 5/16 >>>    Objective: Blood pressure 130/89, pulse 71, temperature 97.5 F (36.4 C), temperature source Oral, resp. rate 20, height 5\' 2"  (1.575 m), weight 90.719 kg (200 lb), SpO2 94.00%.  Intake/Output Summary (Last 24 hours) at 09/23/12 1139 Last data filed at 09/23/12 1024  Gross per 24 hour  Intake    840 ml  Output   1000 ml  Net   -160 ml    Exam: General: No acute respiratory distress  Lungs: diffuse exp wheeze to auscultation bilaterally - no crackles Cardiovascular: Regular rate and rhythm without murmur gallop or rub normal S1 and S2, focal peripheral edema of lower extremity primarily  foot and ankle with associated cellulitic skin changes but no JVD Abdomen: Nontender, nondistended, soft, bowel sounds positive, no rebound, no ascites, no appreciable mass Musculoskeletal: No significant cyanosis, clubbing of bilateral lower extremities Neurological: Alert and oriented x  3 given suspected baseline developmental delay, moves all extremities x 4 without focal neurological deficits, CN 2-12 intact  Data Reviewed: Basic Metabolic Panel:  Recent Labs Lab 09/19/12 0344 09/19/12 0915 09/20/12 0525 09/21/12 0436 09/23/12 0636 09/23/12 0810  NA 146*  --  150* 145 149* 148*  K 4.6  --  4.8 4.2 4.4 4.4  CL 109  --  113* 107 107 107  CO2 32  --  33* 28 32 30  GLUCOSE 107*  --  107* 94 95 102*  BUN 14  --  16 17 17 17   CREATININE 1.17 1.23 1.16 1.14 1.11 1.14  CALCIUM 9.1  --  9.0 9.3 9.7 9.6   Liver Function Tests:  Recent Labs Lab 09/20/12 0525 09/21/12 0436  AST 18 18  ALT 24 20  ALKPHOS 100 101  BILITOT 0.2* 0.3  PROT 6.4 6.7  ALBUMIN 3.2* 3.1*    Recent Labs Lab 09/20/12 0754  AMMONIA 37   CBC:  Recent Labs Lab 09/19/12 0344 09/19/12 0915 09/20/12 0525 09/21/12 0436 09/23/12 0636  WBC 9.9 10.5 10.1 11.9* 8.5  NEUTROABS 6.5  --  8.1*  --   --   HGB 15.3 15.7 13.7 14.1 15.1  HCT 47.8 48.7 43.6 44.4 46.6  MCV 103.5* 104.7* 105.6* 103.7* 102.9*  PLT 224 233 219 222 238    Recent Results (from the past 240 hour(s))  MRSA PCR SCREENING     Status: None   Collection Time    09/19/12 11:46 AM      Result Value Range Status   MRSA by PCR NEGATIVE  NEGATIVE Final   Comment:            The GeneXpert MRSA Assay (FDA     approved for NASAL specimens     only), is one component of a     comprehensive MRSA colonization     surveillance program. It is not     intended to diagnose MRSA     infection nor to guide or     monitor treatment for     MRSA infections.     Studies:  Recent x-ray studies have been reviewed in detail by the Attending Physician  Scheduled Meds:  Reviewed in detail by the Attending Physician   [facility] Office  [phone] Pager [phone]

## 2012-09-23 NOTE — Progress Notes (Signed)
RT spoke with patient about wearing cpap.  Patient states that he does not like to wear one and that he has never wore one at home.  RT explained sleep apnea and the risks of not wearing CPAP.  Patient still refused and stated that he did not wish to wear CPAP.

## 2012-09-24 DIAGNOSIS — L03119 Cellulitis of unspecified part of limb: Secondary | ICD-10-CM

## 2012-09-24 LAB — BLOOD GAS, ARTERIAL
Bicarbonate: 37.3 mEq/L — ABNORMAL HIGH (ref 20.0–24.0)
Delivery systems: POSITIVE
Drawn by: 33234
O2 Content: 3 L/min
O2 Saturation: 90.7 %
pCO2 arterial: 59.8 mmHg (ref 35.0–45.0)
pH, Arterial: 7.411 (ref 7.350–7.450)
pO2, Arterial: 63.9 mmHg — ABNORMAL LOW (ref 80.0–100.0)

## 2012-09-24 LAB — CBC
HCT: 51.4 % (ref 39.0–52.0)
Hemoglobin: 16.4 g/dL (ref 13.0–17.0)
MCHC: 31.9 g/dL (ref 30.0–36.0)
RBC: 5.03 MIL/uL (ref 4.22–5.81)
WBC: 11 10*3/uL — ABNORMAL HIGH (ref 4.0–10.5)

## 2012-09-24 LAB — BASIC METABOLIC PANEL
Chloride: 99 mEq/L (ref 96–112)
GFR calc Af Amer: 70 mL/min — ABNORMAL LOW (ref >90)
GFR calc non Af Amer: 60 mL/min — ABNORMAL LOW (ref >90)
Potassium: 4 mEq/L (ref 3.5–5.1)
Sodium: 147 mEq/L — ABNORMAL HIGH (ref 135–145)

## 2012-09-24 MED ORDER — IPRATROPIUM BROMIDE 0.02 % IN SOLN: 0.5000 mg | Freq: Four times a day (QID) | RESPIRATORY_TRACT | Status: DC

## 2012-09-24 MED ORDER — OLOPATADINE HCL 0.1 % OP SOLN: 1.0000 [drp] | Freq: Two times a day (BID) | OPHTHALMIC | Status: DC

## 2012-09-24 MED ORDER — NEOMYCIN-POLYMYXIN-DEXAMETH 3.5-10000-0.1 OP OINT: 1.0000 "application " | TOPICAL_OINTMENT | Freq: Every day | OPHTHALMIC | Status: DC

## 2012-09-24 MED ORDER — ALBUTEROL SULFATE (5 MG/ML) 0.5% IN NEBU: 2.5000 mg | INHALATION_SOLUTION | Freq: Four times a day (QID) | RESPIRATORY_TRACT | Status: DC

## 2012-09-24 MED ORDER — CYANOCOBALAMIN 1000 MCG/ML IJ SOLN: 1000.0000 ug | Freq: Every day | INTRAMUSCULAR | Status: DC

## 2012-09-24 MED ORDER — FOLIC ACID 1 MG PO TABS: 1.0000 mg | ORAL_TABLET | Freq: Every day | ORAL | Status: DC

## 2012-09-24 MED ORDER — BUDESONIDE 0.25 MG/2ML IN SUSP: 0.2500 mg | Freq: Two times a day (BID) | RESPIRATORY_TRACT | Status: AC

## 2012-09-24 MED ORDER — ALBUTEROL SULFATE (5 MG/ML) 0.5% IN NEBU: 2.5000 mg | INHALATION_SOLUTION | Freq: Four times a day (QID) | RESPIRATORY_TRACT | Status: AC | PRN

## 2012-09-24 MED ORDER — IPRATROPIUM BROMIDE 0.02 % IN SOLN: 0.5000 mg | Freq: Four times a day (QID) | RESPIRATORY_TRACT | Status: DC | PRN

## 2012-09-24 MED ORDER — ALBUTEROL SULFATE (5 MG/ML) 0.5% IN NEBU: 2.5000 mg | INHALATION_SOLUTION | RESPIRATORY_TRACT | Status: DC | PRN

## 2012-09-24 MED ORDER — ACETAMINOPHEN 325 MG PO TABS: 650.0000 mg | ORAL_TABLET | Freq: Four times a day (QID) | ORAL | Status: DC | PRN

## 2012-09-24 MED ORDER — DOXYCYCLINE HYCLATE 100 MG PO TABS: 100.0000 mg | ORAL_TABLET | Freq: Two times a day (BID) | ORAL | Status: DC

## 2012-09-24 NOTE — Progress Notes (Signed)
Pt. Refuses CPAP at this time. Pt. Was made aware to call RT anytime during the night if he changed his mind & decided to wear CPAP.

## 2012-09-24 NOTE — Discharge Summary (Addendum)
Physician Discharge Summary  [name] MVH:[id number] DOB: [birth year] DOA: 09/18/2012  PCP: No primary provider on file.  Admit date: 09/18/2012 Discharge date: 09/25/2012  Time spent: 60 minutes  Recommendations for Outpatient Follow-up:  Tachycardia felt to be due to hypoxia.  Patient needs outpatient poly sonogram testing and pulmonary follow up. Patient will be discharged on 2L of oxygen n/c continuous and CPAP at night. Encourage incentive spirometry Patient will be discharged to SNF for physical therapy and respiratory care. Patient has been slightly hypernatremic with a slightly elevated creatinine.  BMET in 3-5 days.  Discharge Diagnoses:  Active Problems:   Acute respiratory failure with hypoxia   Encephalopathy acute   Left leg pain   Hypercapnia   Suspected OSA (obstructive sleep apnea)   Cellulitis of left lower extremity   Dehydration with hypernatremia   Narcotic induced mental alteration   Developmental disability   Acute respiratory acidosis   Gallstone (impacted)   Discharge Condition: stable on oxygen.  He becomes tachycardic when he exerts himself physically.  Diet recommendation: heart healthy  Filed Weights   09/19/12 0610  Weight: 90.719 kg (200 lb)    History of present illness:  47 year old male patient with pmh of hypertension, mental delay (functional level of a 46 yo) and tobacco abuse.  He presented to [facility] emergency department because of swelling redness and pain in left leg. After arrival he received narcotic pain medications. He experienced  oxygen desaturations to 80%. CT of the head and chest were unremarkable for any acute problems. ABG demonstrated respiratory acidosis with a pH of 7.2 and a PCO2 in the mid 70s. He was admitted to the step down unit for BiPAP was initiated. After admission family communicated with the staff and apparently patient's mother recently died and family has assumed care for this patient. He was also  scheduled for an outpatient polysomnogram due to concerns of possible sleep apnea.   Hospital Course:  Acute respiratory failure with hypoxia and Hypercapnia due to :  A) Suspected OSA (obstructive sleep apnea)  B) Narcotic induced mental alteration  C) Suspected COPD/ongoing tobaccoa use  -After multiple doses of Narcan his mentation improved to baseline.   -He now requires CPAP during sleep and 2 Liters of Oxygen N/C continuous during waking hours. -has PSG scheduled at [facility] and will need CPAP at discharge -avoid sedating meds / no narcotics -cont albuterol nebs and add inhaled steroid as pt continues to wheeze.  -Even with normal LVEF on echo, chest x-ray showed vascular congestion and the patient was diuresed gently with lasix.  Encephalopathy acute  -due to narcs then hypercarbia - resolved   Left leg pain/Cellulitis of left lower extremity  -venous duplex shows no DVT and CT Chest shows no PE  -Initially empiric Clinda and Vanco - narrowed to Doxycycline monotherapy 5/16 will continue for a total of 10 days of antibiotic therapy. -Followup echo to ensure no cardiac etiology to edema  -adding Tylenol and Motrin for pain   Dehydration with hypernatremia  -corrected w/ volume resuscitation   Macrocytosis  -B12 <400 , B12 supplementation started.   ? Developmental disability  -family reports he does not carry this dx, but he appears to have to functional capability of a 18-15 yo.  Gallstone noted on CT chest  -LFT's normal and no apparent abd pain  Pink eye  -topical antibiotic started.   Procedures: Bilateral lower extremity venous duplex completed.  Preliminary report: Bilateral: No evidence of DVT, superficial thrombosis,  or Baker's Cyst  2D Echocardiogram:  Study Conclusions - Left ventricle: The cavity size was normal. Wall thickness was normal. Systolic function was normal. The estimated ejection fraction was in the range of 60% to 65%. - Aortic  valve: Peak and mean gradients through the AV are 22 and 13 mm Hg respectively consistent with very minimally restricted motion.  Consultations:  Pulmonary critical care  Discharge Exam: Filed Vitals:   09/24/12 2101 09/25/12 0535 09/25/12 1012 09/25/12 1013  BP: 102/69 121/79    Pulse: 111 109    Temp: 98.8 F (37.1 C) 98.3 F (36.8 C)    TempSrc: Oral Oral    Resp: 22 16    Height:      Weight:      SpO2: 91% 97% 97% 97%   General: well developed, over weight male, sitting in chair with oxygen via n/c at 2L. No acute respiratory distress  Lungs: CTA bilaterally, decreased air movement. Cardiovascular: Regular rate and rhythm without murmur gallop or rub normal S1 and S2,  Abdomen: Nontender, nondistended, soft, bowel sounds positive, no rebound, no ascites, no appreciable mass  Musculoskeletal: No significant cyanosis, clubbing of bilateral lower extremities.  Left lower extremity still with some swelling in the distal tibial area.  Erythema resolved. Neurological: Alert and oriented x 3 given suspected baseline developmental delay, moves all extremities x 4 without focal neurological deficits, CN 2-12 intact   Discharge Instructions      Discharge Orders   Future Appointments Provider Department Dept Phone   10/16/2012 2:30 PM Barbaraann Share, MD [facility] [phone]   Future Orders Complete By Expires     Diet - low sodium heart healthy  As directed     Diet - low sodium heart healthy  As directed     Increase activity slowly  As directed     Increase activity slowly  As directed         Medication List    TAKE these medications       acetaminophen 325 MG tablet  Commonly known as:  TYLENOL  Take 2 tablets (650 mg total) by mouth every 6 (six) hours as needed.     albuterol (5 MG/ML) 0.5% nebulizer solution  Commonly known as:  PROVENTIL  Take 0.5 mLs (2.5 mg total) by nebulization every 6 (six) hours as needed for wheezing.     budesonide 0.25  MG/2ML nebulizer solution  Commonly known as:  PULMICORT  Take 2 mLs (0.25 mg total) by nebulization 2 (two) times daily.     cyanocobalamin 1000 MCG/ML injection  Commonly known as:  (VITAMIN B-12)  Inject 1 mL (1,000 mcg total) into the skin daily.     doxycycline 100 MG tablet  Commonly known as:  VIBRA-TABS  Take 1 tablet (100 mg total) by mouth every 12 (twelve) hours.     folic acid 1 MG tablet  Commonly known as:  FOLVITE  Take 1 tablet (1 mg total) by mouth daily.     ipratropium 0.02 % nebulizer solution  Commonly known as:  ATROVENT  Take 2.5 mLs (0.5 mg total) by nebulization 4 (four) times daily as needed for wheezing.     neomycin-polymyxin b-dexamethasone 3.5-10000-0.1 Oint  Commonly known as:  MAXITROL  Place 1 application into both eyes 6 (six) times daily.     olopatadine 0.1 % ophthalmic solution  Commonly known as:  PATANOL  Place 1 drop into both eyes 2 (two) times daily.  No Known Allergies Follow-up Information   Follow up with Barbaraann Share, MD On 10/16/2012. (at 2:30pm, arrive 20 minutes early to complete paperwork)    Contact information:   [address] [place] [address] [phone]        The results of significant diagnostics from this hospitalization (including imaging, microbiology, ancillary and laboratory) are listed below for reference.    Significant Diagnostic Studies: Dg Chest 2 View  09/23/2012   *RADIOLOGY REPORT*  Clinical Data: Possible pulmonary edema.  Dyspnea on exertion.  CHEST - 2 VIEW  Comparison: 09/20/2012  Findings: Cardiomegaly accentuated by AP portable technique. Midline trachea.  No definite pleural fluid. No pneumothorax. Extremely low lung volumes.  Persistent mild interstitial edema. Right greater than left bibasilar airspace disease/atelectasis.  IMPRESSION: Persistent low lung volumes with mild interstitial edema.  Increasing bibasilar atelectasis.   Original Report Authenticated By: Jeronimo Greaves, M.D.    Ct Head Wo Contrast  09/19/2012   *RADIOLOGY REPORT*  Clinical Data: Pain point pupils, right pupil dilatation, history of eye surgery  CT HEAD WITHOUT CONTRAST  Technique:  Contiguous axial images were obtained from the base of the skull through the vertex without contrast.  Comparison: None.  Findings:  Examination is degraded secondary to patient motion artifact necessitating the acquisition of additional images.  The gray-white differentiation appears preserved.  No CT evidence of acute large territory infarct.  No intraparenchymal or extra- axial mass or hemorrhage.  Normal size and configuration of the ventricles and basilar cisterns.  Incidental note is made of a cavum septum pellucidum.  No midline shift.  There is underpneumatization of the bilateral frontal sinuses.  The mucosal polyp is seen within the left maxillary sinus. The remaining paranasal sinuses and mastoid air cells are normally aerated.  No air fluid levels.  The regional soft tissues are normal.  No displaced calvarial fracture.  Post bilateral cataract surgery.  IMPRESSION:  Degraded examination without acute intracranial process.   Original Report Authenticated By: Tacey Ruiz, MD   Ct Angio Chest Pe W/cm &/or Wo Cm  09/19/2012   *RADIOLOGY REPORT*  Clinical Data: Tachycardia, hypoxia, elevated D-dimer, evaluate for pulmonary embolism  CT ANGIOGRAPHY CHEST  Technique:  Multidetector CT imaging of the chest using the standard protocol during bolus administration of intravenous contrast. Multiplanar reconstructed images including MIPs were obtained and reviewed to evaluate the vascular anatomy.  Contrast: OMNIPAQUE IOHEXOL 350 MG/ML SOLN  Comparison: None.  Vascular Findings:  There is suboptimal opacification of the pulmonary arterial system of the main pulmonary artery measuring only 227 HU. Given this limitation, there are no discrete filling defects within the pulmonary arterial tree to the level of the bilateral sub  segmental pulmonary arteries.  Evaluation of the subsegmental pulmonary arteries is degraded secondary to a combination of suboptimal vessel opacification, patient body habitus and respiratory artifact.  Normal caliber of the main pulmonary artery.  Borderline cardiomegaly.  No pericardial effusion.  Normal caliber of the thoracic aorta.  Bovine configuration of the aortic arch. No definite periaortic stranding.  Nonvascular findings:  Evaluation of the pulmonary parenchyma is degraded secondary to patient respiratory artifact. There is minimal segmental atelectasis involving the medial and basilar segments of the bilateral lower lobes.  There is minimal linear heterogeneous and ground-glass opacities most conspicuous within the superior segment of the right lower lobe anterior segment of the lingula favored to represent atelectasis. No focal airspace opacities.  No pleural effusion or pneumothorax.  There is mild thickening  of the segmental bronchi, most conspicuous within the bilateral lower lobes. Given limitations of the examination, there are no discrete underlying pulmonary nodules.  There is a solitary borderline enlarged mediastinal lymph node adjacent to the aortic arch measuring approximately 1.1 cm in grade short axis diameter (image 22, series nine). There is a shoddy bilateral hilar lymph node which measures approximately 9 mm in diameter (right hilar node - image 97, series 11; left infrahilar node - image 102) which are not enlarged by CT criteria.  Limited early arterial phase evaluation of the upper abdomen demonstrates a sub centimeter hypoattenuating lesion within the dome of the left lobe of the liver (image 57, series nine) which in the absence of a known primary malignancy likely represents a hepatic cyst.  A punctate (2-3 mm) gallstone is seen within the neck of a mildly distended but otherwise normal appearing gallbladder (image 64) though note, the entirety of the gallbladder is not imaged  on this examination.  No acute or aggressive osseous abnormalities.  IMPRESSION:  1.  Multifactorial examination degradation without evidence of pulmonary embolism to the level of the bilateral subsegmental pulmonary arteries. 2.  Mild thickening of the segmental bronchi, is within the bilateral lower lobes, nonspecific but could be seen in the setting of airways disease/bronchitis.  3.  A punctate gallstone is noted within the neck of a mildly distended but otherwise normal-appearing gallbladder, note however, the entirety of the gallbladder was not imaged on this examination. If there is concern for acute cholecystitis, further evaluation with gallbladder ultrasound is recommended.   Original Report Authenticated By: Tacey Ruiz, MD   US Abdomen Port  09/20/2012   *RADIOLOGY REPORT*  Clinical Data:  Chest pain.  Evaluate for cholecystitis.  COMPLETE ABDOMINAL ULTRASOUND  Comparison:  Chest CT 09/19/2012  Findings:  Gallbladder:  The gallbladder is distended without definite stones. There is no wall thickening and the patient did not have a sonographic Murphy's sign.  Common bile duct:  Measures 4 mm.  Liver:  Left lobe is not visualized well due to bowel gas.  No gross abnormality to the right hepatic lobe but limited evaluation.  IVC:  Not well visualized.  Pancreas:  Poorly visualized due to bowel gas.  Spleen:  Measures 5.1 cm in length.  Right Kidney:  Kidney roughly measures 10 cm in length.    There may be fullness of the right renal collecting system versus parapelvic cysts.  Left Kidney:  Left kidney measures 10.7 cm in length.  No definite hydronephrosis.  A lower pole cyst roughly measuring 1.6 cm.  Abdominal aorta:  Obscured by bowel gas.  IMPRESSION: Technically challenging examination due to bowel gas and body habitus.  The gallbladder is distended without evidence for gallstones.  Limited evaluation of the retroperitoneal structures.  Difficult to differentiate right renal parapelvic cysts versus  mild hydronephrosis.   Original Report Authenticated By: Richarda Overlie, M.D.   Dg Chest Port 1 View  09/20/2012   *RADIOLOGY REPORT*  Clinical Data: Hypoxia  PORTABLE CHEST - 1 VIEW  Comparison: CT 09/19/2012  Findings: Hypoventilation with decreased lung volume and bibasilar atelectasis.  Mild diffuse airspace disease, most likely pulmonary edema.  No significant effusion  IMPRESSION: Hypoventilation.  Probable mild edema.   Original Report Authenticated By: Janeece Riggers, M.D.    Microbiology: Recent Results (from the past 240 hour(s))  MRSA PCR SCREENING     Status: None   Collection Time    09/19/12 11:46 AM  Result Value Range Status   MRSA by PCR NEGATIVE  NEGATIVE Final   Comment:            The GeneXpert MRSA Assay (FDA     approved for NASAL specimens     only), is one component of a     comprehensive MRSA colonization     surveillance program. It is not     intended to diagnose MRSA     infection nor to guide or     monitor treatment for     MRSA infections.     Labs: Basic Metabolic Panel:  Recent Labs Lab 09/20/12 0525 09/21/12 0436 09/23/12 0636 09/23/12 0810 09/24/12 0425  NA 150* 145 149* 148* 147*  K 4.8 4.2 4.4 4.4 4.0  CL 113* 107 107 107 99  CO2 33* 28 32 30 39*  GLUCOSE 107* 94 95 102* 118*  BUN 16 17 17 17  24*  CREATININE 1.16 1.14 1.11 1.14 1.38*  CALCIUM 9.0 9.3 9.7 9.6 10.4   Liver Function Tests:  Recent Labs Lab 09/20/12 0525 09/21/12 0436  AST 18 18  ALT 24 20  ALKPHOS 100 101  BILITOT 0.2* 0.3  PROT 6.4 6.7  ALBUMIN 3.2* 3.1*    Recent Labs Lab 09/20/12 0754  AMMONIA 37   CBC:  Recent Labs Lab 09/19/12 0344 09/19/12 0915 09/20/12 0525 09/21/12 0436 09/23/12 0636 09/24/12 0425  WBC 9.9 10.5 10.1 11.9* 8.5 11.0*  NEUTROABS 6.5  --  8.1*  --   --   --   HGB 15.3 15.7 13.7 14.1 15.1 16.4  HCT 47.8 48.7 43.6 44.4 46.6 51.4  MCV 103.5* 104.7* 105.6* 103.7* 102.9* 102.2*  PLT 224 233 219 222 238 253       Signed:  Stephani Police, PA-C  [facility] 09/25/2012, 10:20 AM

## 2012-09-24 NOTE — Progress Notes (Signed)
CRITICAL VALUE ALERT  Critical value received:  ABG  Date of notification:  09/24/12  Time of notification:  0500  Critical value read back:yes  Nurse who received alert:  MARIE  MD notified (1st page):  [facility]  Time of first page:  0552  MD notified (2nd page): N/A  Time of second page:  Responding MD:  Claiborne Billings  Time MD responded:  [phone]

## 2012-09-24 NOTE — Progress Notes (Signed)
Physical Therapy Treatment Patient Details Name: [name] MRN: [id number] DOB: 1965-10-05 Today's Date: 09/24/2012 Time: 4098-1191 PT Time Calculation (min): 17 min  PT Assessment / Plan / Recommendation Comments on Treatment Session  Pt receptive to use of RW during gait and benefited from use of O2 to control dyspnea.  HR 125-127. He will benefit from continued PT and monitoring for short term at discharge prior to returning home alone    Follow Up Recommendations  SNF;Other (comment) (unless family able to be around until he recovers)     Does the patient have the potential to tolerate intense rehabilitation     Barriers to Discharge        Equipment Recommendations  Other (comment);None recommended by PT (TBA  based on family info.)    Recommendations for Other Services    Frequency Min 3X/week   Plan      Precautions / Restrictions Restrictions Weight Bearing Restrictions: No   Pertinent Vitals/Pain Pt with no c/o pain today    Mobility  Bed Mobility Bed Mobility: Not assessed Details for Bed Mobility Assistance: Pt up in recliner upon arrival Transfers Transfers: Sit to Stand;Stand to Sit Sit to Stand: With upper extremity assist;With armrests;From chair/3-in-1;4: Min guard Stand to Sit: 4: Min guard;To chair/3-in-1 Details for Transfer Assistance: min guard due to mild unsteadiness Ambulation/Gait Ambulation/Gait Assistance: 4: Min guard Ambulation Distance (Feet): 150 Feet Assistive device: Rolling walker (rail at times) Ambulation/Gait Assistance Details: assist for safety. Pt endouraged to slow down as HR maintaining 124-127. Nasal O2 intact at 2 L Pt needed repeated cues to try to stand more erect.  Asked pt to stop and stand periodically during gait to corrrect posture.  Gait Pattern: Step-through pattern;Decreased step length - right;Decreased step length - left;Wide base of support;Trunk flexed Gait velocity: tends to be impulsive.   General Gait  Details: Pt continues to walk with stooped posture and uncontrolled speed.  He is assisted with RW to walk with more even step length.  He needs continued assist for safety and HR elevated today.   Stairs: No    Exercises General Exercises - Lower Extremity Gluteal Sets: AROM;Both;5 reps;Standing (pt with difficulty producing glute set to command)   PT Diagnosis:    PT Problem List:   PT Treatment Interventions:     PT Goals Acute Rehab PT Goals PT Goal Formulation: With patient Time For Goal Achievement: 09/28/12 Potential to Achieve Goals: Good Pt will go Supine/Side to Sit: with modified independence;with HOB 0 degrees Pt will go Sit to Stand: with modified independence PT Goal: Sit to Stand - Progress: Progressing toward goal Pt will Transfer Bed to Chair/Chair to Bed: with modified independence Pt will Ambulate: >150 feet;with supervision PT Goal: Ambulate - Progress: Progressing toward goal  Visit Information  Last PT Received On: 09/24/12 Assistance Needed: +1    Subjective Data  Subjective: Thanks for coming by today Patient Stated Goal: to go to SNF near [facility]   Cognition  Cognition Arousal/Alertness: Awake/alert Behavior During Therapy: [facility] for tasks assessed/performed Overall Cognitive Status: History of cognitive impairments - at baseline    Balance  Balance Balance Assessed: Yes Static Standing Balance Static Standing - Balance Support: No upper extremity supported;Bilateral upper extremity supported Static Standing - Level of Assistance: 5: Stand by assistance (working on glute activation in standing)  End of Session PT - End of Session Equipment Utilized During Treatment: Oxygen Activity Tolerance: Other (comment);Treatment limited secondary to medical complications (Comment) (sustained increased  HR) Patient left: in chair;with call bell/phone within reach Nurse Communication: Mobility status   GP    Bayard Hugger. Manson Passey,  [place] [phone] 09/24/2012, 11:34 AM

## 2012-09-24 NOTE — Progress Notes (Signed)
Patient requested cpap per RN.  RT placed patient on CPAP nasal mask Auto titrate mode with oxygen bled in.  Patient tolerating well at this time. RT will continue to monitor.

## 2012-09-24 NOTE — Discharge Summary (Signed)
Addendum  Patient seen and examined, chart and data base reviewed.  I agree with the above assessment and discharge plan.  For full details please see Mrs. Algis Downs PA note.  Acute respiratory failure with hypoxia and hypercarbia.  Now to be chronic, patient discharged on 2 L of oxygen, CPAP at bedtime .  Patient to be discharged to nursing home when bed is available.  Clint Lipps, MD [facility] Pager: [phone] 09/24/2012, 3:40 PM

## 2012-09-25 NOTE — Progress Notes (Signed)
Addendum  Patient seen and examined, chart and data base reviewed.  I agree with the above assessment and plan.  For full details please see Mrs. Algis Downs PA note.   Clint Lipps, MD [facility] Pager: [phone] 09/25/2012, 2:39 PM

## 2012-09-25 NOTE — Clinical Social Work Placement (Addendum)
Clinical Social Work Department CLINICAL SOCIAL WORK PLACEMENT NOTE 09/25/2012  Patient:  LAWYER, WASHABAUGH  Account Number:  [id number] Admit date:  09/18/2012  Clinical Social Worker:  Genelle Bal, LCSW  Date/time:  09/25/2012 07:36 AM  Clinical Social Work is seeking post-discharge placement for this patient at the following level of care:   SKILLED NURSING   (*CSW will update this form in Epic as items are completed)   09/24/2012  Patient/family provided with [facility] Department of Clinical Social Work's list of facilities offering this level of care within the geographic area requested by the patient (or if unable, by the patient's family).    Patient/family informed of their freedom to choose among providers that offer the needed level of care, that participate in Medicare, Medicaid or managed care program needed by the patient, have an available bed and are willing to accept the patient.    Patient/family informed of [facility]' ownership interest in [facility], as well as of the fact that they are under no obligation to receive care at this facility.  PASARR submitted to EDS on 09/25/2012 PASARR number received from EDS on   FL2 transmitted to all facilities in geographic area requested by pt/family on  09/24/2012 FL2 transmitted to all facilities within larger geographic area on   Patient informed that his/her managed care company has contracts with or will negotiate with  certain facilities, including the following:     Patient/family informed of bed offers received:  09/25/12 Dorma Russell) Patient [facility] bed at [facility]) Physician recommends and patient chooses bed at  [facility])  Patient to be transferred to  on   Patient to be transferred to facility by   The following physician request were entered in Epic:   Additional Comments:

## 2012-09-25 NOTE — Progress Notes (Signed)
Nsg Discharge Note  Admit Date:  09/18/2012 Discharge date: 09/25/2012   [name] to be D/C'd Nursing Home per MD order.  AVS completed.  Copy for chart, and copy for patient signed, and dated. Patient/caregiver able to verbalize understanding.  Discharge Medication:   Medication List    TAKE these medications       acetaminophen 325 MG tablet  Commonly known as:  TYLENOL  Take 2 tablets (650 mg total) by mouth every 6 (six) hours as needed.     albuterol (5 MG/ML) 0.5% nebulizer solution  Commonly known as:  PROVENTIL  Take 0.5 mLs (2.5 mg total) by nebulization every 6 (six) hours as needed for wheezing.     budesonide 0.25 MG/2ML nebulizer solution  Commonly known as:  PULMICORT  Take 2 mLs (0.25 mg total) by nebulization 2 (two) times daily.     cyanocobalamin 1000 MCG/ML injection  Commonly known as:  (VITAMIN B-12)  Inject 1 mL (1,000 mcg total) into the skin daily.     doxycycline 100 MG tablet  Commonly known as:  VIBRA-TABS  Take 1 tablet (100 mg total) by mouth every 12 (twelve) hours.     folic acid 1 MG tablet  Commonly known as:  FOLVITE  Take 1 tablet (1 mg total) by mouth daily.     ipratropium 0.02 % nebulizer solution  Commonly known as:  ATROVENT  Take 2.5 mLs (0.5 mg total) by nebulization 4 (four) times daily as needed for wheezing.     neomycin-polymyxin b-dexamethasone 3.5-10000-0.1 Oint  Commonly known as:  MAXITROL  Place 1 application into both eyes 6 (six) times daily.     olopatadine 0.1 % ophthalmic solution  Commonly known as:  PATANOL  Place 1 drop into both eyes 2 (two) times daily.        Discharge Assessment: Filed Vitals:   09/25/12 0535  BP: 121/79  Pulse: 109  Temp: 98.3 F (36.8 C)  Resp: 16   Skin clean, dry and intact without evidence of skin break down, no evidence of skin tears noted. IV catheter discontinued intact. Site without signs and symptoms of complications - no redness or edema noted at insertion  site, patient denies c/o pain - only slight tenderness at site.  Dressing with slight pressure applied.  D/c Instructions-Education: Discharge instructions given to patient/family with verbalized understanding. D/c education completed with patient/family including follow up instructions, medication list, d/c activities limitations if indicated, with other d/c instructions as indicated by MD - patient able to verbalize understanding, all questions fully answered. Patient instructed to return to ED, call 911, or call MD for any changes in condition.  Patient escorted via WC, and D/C home via private auto.  Joshua Emig Consuella Lose, RN 09/25/2012 2:56 PM

## 2012-09-25 NOTE — Clinical Social Work Note (Signed)
CSW spoke with pt's sister today to provide bed offers. Pt's sister has accepted bed offer from [facility]. [facility] able to accept pt today. Awaiting PASARR number. Will continue to follow.  Dellie Burns, MSW, [place] [phone] (coverage)

## 2012-09-25 NOTE — Progress Notes (Signed)
[facility] PROGRESS NOTE  Rannie Craney Brogdon RUE:[id number] DOB: 05/02/1966 DOA: 09/18/2012 PCP: No primary provider on file.  Assessment/Plan:  Acute respiratory failure with hypoxia and Hypercapnia due to :  A) Suspected OSA (obstructive sleep apnea)  B) Narcotic induced mental alteration  C) Suspected COPD/ongoing tobacco use  -After multiple doses of Narcan his mentation improved to baseline.  -He now requires CPAP during sleep and 2 Liters of Oxygen N/C continuous during waking hours.  -has PSG scheduled at [facility] and will need CPAP at discharge  -avoid sedating meds / no narcotics  -cont albuterol nebs and add inhaled steroid  -Even with normal LVEF on echo, chest x-ray showed vascular congestion and the patient was diuresed gently with lasix.    Encephalopathy acute  -due to narcs then hypercarbia - resolved   Left leg pain/Cellulitis of left lower extremity  -venous duplex shows no DVT and CT Chest shows no PE  -Initially empiric Clinda and Vanco - narrowed to Doxycycline monotherapy 5/16 will continue for a total of 10 days of antibiotic therapy.  -adding Tylenol and Motrin for pain   Dehydration with hypernatremia  -corrected w/ volume resuscitation   Macrocytosis  -B12 <400 , B12 supplementation started.   ? Developmental disability  -family reports he does not carry this dx, but he appears to have to functional capability of a 46-15 yo.   Gallstone noted on CT chest  -LFT's normal and no apparent abd pain   Pink eye  -topical antibiotic started.  DVT Prophylaxis:  lovenox  Code Status: full Family Communication:  Disposition Plan: to snf when bed available.   Consultants:    Procedures:  2D echo 5/16 Study Conclusions  - Left ventricle: The cavity size was normal. Wall thickness was normal. Systolic function was normal. The estimated ejection fraction was in the range of 60% to 65%. - Aortic valve: Peak and mean gradients through  the AV are 22 and 13 mm Hg respectively consistent with very minimally restricted motion.    Antibiotics:  doxycycline  HPI/Subjective: "Is today my lucky day, do I get to go home?"  Objective: Filed Vitals:   09/24/12 2101 09/25/12 0535 09/25/12 1012 09/25/12 1013  BP: 102/69 121/79    Pulse: 111 109    Temp: 98.8 F (37.1 C) 98.3 F (36.8 C)    TempSrc: Oral Oral    Resp: 22 16    Height:      Weight:      SpO2: 91% 97% 97% 97%    Intake/Output Summary (Last 24 hours) at 09/25/12 1021 Last data filed at 09/24/12 2100  Gross per 24 hour  Intake    666 ml  Output    700 ml  Net    -34 ml   Filed Weights   09/19/12 0610  Weight: 90.719 kg (200 lb)    Exam:   General:  A&O, NAD, sitting in his chair watching TV  Cardiovascular: RRR, no murmurs, rubs or gallops, no lower extremity edema  Respiratory: CTA, no wheeze, crackles, or rales.  No increased work of breathing.  Abdomen: Soft, non-tender, non-distended, + bowel sounds, no masses  Musculoskeletal: Able to move all 4 extremities, 5/5 strength in each  Data Reviewed: Basic Metabolic Panel:  Recent Labs Lab 09/20/12 0525 09/21/12 0436 09/23/12 0636 09/23/12 0810 09/24/12 0425  NA 150* 145 149* 148* 147*  K 4.8 4.2 4.4 4.4 4.0  CL 113* 107 107 107 99  CO2 33* 28 32 30  39*  GLUCOSE 107* 94 95 102* 118*  BUN 16 17 17 17  24*  CREATININE 1.16 1.14 1.11 1.14 1.38*  CALCIUM 9.0 9.3 9.7 9.6 10.4   Liver Function Tests:  Recent Labs Lab 09/20/12 0525 09/21/12 0436  AST 18 18  ALT 24 20  ALKPHOS 100 101  BILITOT 0.2* 0.3  PROT 6.4 6.7  ALBUMIN 3.2* 3.1*    Recent Labs Lab 09/20/12 0754  AMMONIA 37   CBC:  Recent Labs Lab 09/19/12 0344 09/19/12 0915 09/20/12 0525 09/21/12 0436 09/23/12 0636 09/24/12 0425  WBC 9.9 10.5 10.1 11.9* 8.5 11.0*  NEUTROABS 6.5  --  8.1*  --   --   --   HGB 15.3 15.7 13.7 14.1 15.1 16.4  HCT 47.8 48.7 43.6 44.4 46.6 51.4  MCV 103.5* 104.7* 105.6*  103.7* 102.9* 102.2*  PLT 224 233 219 222 238 253    Recent Results (from the past 240 hour(s))  MRSA PCR SCREENING     Status: None   Collection Time    09/19/12 11:46 AM      Result Value Range Status   MRSA by PCR NEGATIVE  NEGATIVE Final   Comment:            The GeneXpert MRSA Assay (FDA     approved for NASAL specimens     only), is one component of a     comprehensive MRSA colonization     surveillance program. It is not     intended to diagnose MRSA     infection nor to guide or     monitor treatment for     MRSA infections.     Studies: No results found.  Scheduled Meds: . ipratropium  0.5 mg Nebulization QID   And  . albuterol  2.5 mg Nebulization QID  . budesonide (PULMICORT) nebulizer solution  0.25 mg Nebulization BID  . cyanocobalamin  1,000 mcg Subcutaneous Daily  . doxycycline  100 mg Oral Q12H  . folic acid  1 mg Oral Daily  . heparin  5,000 Units Subcutaneous Q8H  . neomycin-polymyxin b-dexamethasone   Both Eyes 6 X Daily  . olopatadine  1 drop Both Eyes BID  . sodium chloride  3 mL Intravenous Q12H   Continuous Infusions:   Active Problems:   Acute respiratory failure with hypoxia   Encephalopathy acute   Left leg pain   Hypercapnia   Suspected OSA (obstructive sleep apnea)   Cellulitis of left lower extremity   Dehydration with hypernatremia   Narcotic induced mental alteration   Developmental disability   Acute respiratory acidosis   Gallstone (impacted)    Conley Canal [facility] Pager [phone]. If 7PM-7AM, please contact night-coverage at [email or web address], password [facility] 09/25/2012, 10:21 AM  LOS: 7 days

## 2012-09-25 NOTE — Discharge Summary (Signed)
Addendum  Patient seen and examined, chart and data base reviewed.  I agree with the above assessment and plan.  For full details please see Mrs. Algis Downs PA note.  Next respiratory failure with hypoxia and hypercarbia.   Now to be chronic, patient discharged on 2 L of oxygen and CPAP each bedtime.  Patient to be reassessed for the need of the oxygen as outpatient, we will need outpatient polysomnogram.  Clint Lipps, MD [facility] Pager: [phone] 09/25/2012, 2:38 PM

## 2012-10-16 ENCOUNTER — Ambulatory Visit (INDEPENDENT_AMBULATORY_CARE_PROVIDER_SITE_OTHER): Payer: Medicaid Other | Admitting: Pulmonary Disease

## 2012-10-16 ENCOUNTER — Encounter: Payer: Self-pay | Admitting: Pulmonary Disease

## 2012-10-16 VITALS — BP 140/90 | HR 116 | Temp 97.4°F | Ht 63.0 in | Wt 246.6 lb

## 2012-10-16 DIAGNOSIS — G4733 Obstructive sleep apnea (adult) (pediatric): Secondary | ICD-10-CM

## 2012-10-16 NOTE — Assessment & Plan Note (Signed)
The patient's history is very suspicious for clinically significant sleep apnea.  He has had a recent sleep study at [facility], and this was ordered by a Dr. Andrey Campanile of otolaryngology per the patient's sister.  I have asked the patient to followup with Dr. Andrey Campanile for results, and if he does indeed have sleep apnea, Dr. Andrey Campanile can decide if he wishes to follow the patient for treatment of his sleep apnea.  I am happy to do so if that decision is made.  I have encouraged the patient was aggressively on weight loss.

## 2012-10-16 NOTE — Progress Notes (Signed)
Subjective:    Patient ID: [name], male    DOB: 12-28-1965, 47 y.o.   MRN: [id number]  HPI The patient is a 47 year old male who I have been asked to see for possible obstructive sleep apnea.  He was recently in the hospital for leg swelling, and the question was raised whether he may have sleep apnea.  He had actually had a sleep study scheduled to at [facility] even before his hospitalization, and this was done last night.  The sister tells me it was ordered by Dr. Andrey Campanile of otolaryngology.  The patient is unsure if he snores, and denies choking arousals.  His sister has witnessed a significant increase in effort of breathing during sleep.  He has frequent awakenings at night, but overall feels fairly rested in the mornings upon arising.  He has significant inappropriate daytime sleepiness, and will fall asleep with any period of inactivity.  He also falls asleep watching television or movies in the evening.  The patient currently does not drive.  He states that his weight is up 30 pounds over the last 2 years, and his Epworth score today is 20.   Questionnaire What time do you typically go to bed?( Between what hours) 930-10p 930-10p at 1441 on 10/16/12 by Nita Sells, CMA How long does it take you to fall asleep? 2 hours 2 hours at 1441 on 10/16/12 by Marjo Bicker Mabe, CMA How many times during the night do you wake up? 3 3 at 1441 on 10/16/12 by Nita Sells, CMA What time do you get out of bed to start your day? 0630 0630 at 1441 on 10/16/12 by Nita Sells, CMA Do you drive or operate heavy machinery in your occupation? No No at 1441 on 10/16/12 by Nita Sells, CMA How much has your weight changed (up or down) over the past two years? (In pounds) 30 lb (13.608 kg)30 lb (13.608 kg) increased at 1441 on 10/16/12 by Marjo Bicker Mabe, CMA Have you ever had a sleep study before? Yes Yes at 1441 on 10/16/12 by Nita Sells, CMA [place]   at 1441 on 10/16/12 by Nita Sells, CMA If yes, date of study? 10/15/2012 10/15/2012 at 1441 on 10/16/12 by Nita Sells, CMA Do you currently use CPAP? No No at 1441 on 10/16/12 by Marjo Bicker Mabe, CMA Do you wear oxygen at any time? Yes Yes at 1441 on 10/16/12 by Nita Sells, CMA   Review of Systems  Constitutional: Positive for unexpected weight change. Negative for fever.  HENT: Positive for congestion. Negative for ear pain, nosebleeds, sore throat, rhinorrhea, sneezing, trouble swallowing, dental problem, postnasal drip and sinus pressure.   Eyes: Negative for redness and itching.  Respiratory: Positive for shortness of breath. Negative for cough, chest tightness and wheezing.   Cardiovascular: Positive for leg swelling ( feet swelling). Negative for palpitations.  Gastrointestinal: Negative for nausea and vomiting.  Genitourinary: Negative for dysuria.  Musculoskeletal: Positive for joint swelling and arthralgias.  Skin: Negative for rash.  Neurological: Positive for headaches.  Hematological: Does not bruise/bleed easily.  Psychiatric/Behavioral: Negative for dysphoric mood. The patient is not nervous/anxious.        Objective:   Physical Exam Constitutional:  Obese male, no acute distress  HENT:  Nares patent without discharge, but narrowed on the left.   Oropharynx without exudate, palate and uvula are thick and elongated.   Eyes:  Perrla, eomi, no  scleral icterus  Neck:  No JVD, no TMG  Cardiovascular:  Normal rate, regular rhythm, no rubs or gallops.  No murmurs        Intact distal pulses  Pulmonary :  Normal breath sounds, no stridor or respiratory distress   No rales, rhonchi, or wheezing  Abdominal:  Soft, nondistended, bowel sounds present.  No tenderness noted.   Musculoskeletal:  1+ lower extremity edema noted.  Lymph Nodes:  No cervical lymphadenopathy noted  Skin:  No cyanosis noted  Neurologic:  Appears mildly sleepy, but appropriate, moves  all 4 extremities without obvious deficit.         Assessment & Plan:

## 2012-10-16 NOTE — Patient Instructions (Addendum)
followup with Dr. Andrey Campanile who ordered your sleep test.  If positive for sleep apnea, Dr. Andrey Campanile can decide if he or myself will follow your sleep apnea. Work on weight loss

## 2013-11-28 ENCOUNTER — Emergency Department (HOSPITAL_COMMUNITY)
Admission: EM | Admit: 2013-11-28 | Discharge: 2013-11-29 | Disposition: A | Discharge status: Home / Self Care | Payer: Medicaid Other | Attending: Emergency Medicine | Admitting: Emergency Medicine

## 2013-11-28 ENCOUNTER — Encounter: Payer: Self-pay | Admitting: Pulmonary Disease

## 2013-11-28 ENCOUNTER — Emergency Department (HOSPITAL_COMMUNITY): Payer: Medicaid Other

## 2013-11-28 ENCOUNTER — Emergency Department (HOSPITAL_COMMUNITY)
Admission: EM | Admit: 2013-11-28 | Discharge: 2013-11-28 | Disposition: A | Discharge status: Home / Self Care | Payer: Medicaid Other | Source: Home / Self Care | Attending: Emergency Medicine | Admitting: Emergency Medicine

## 2013-11-28 ENCOUNTER — Encounter (HOSPITAL_COMMUNITY): Payer: Self-pay | Admitting: Emergency Medicine

## 2013-11-28 DIAGNOSIS — Z862 Personal history of diseases of the blood and blood-forming organs and certain disorders involving the immune mechanism: Secondary | ICD-10-CM | POA: Insufficient documentation

## 2013-11-28 DIAGNOSIS — S79929A Unspecified injury of unspecified thigh, initial encounter: Secondary | ICD-10-CM

## 2013-11-28 DIAGNOSIS — W06XXXA Fall from bed, initial encounter: Secondary | ICD-10-CM

## 2013-11-28 DIAGNOSIS — M48061 Spinal stenosis, lumbar region without neurogenic claudication: Secondary | ICD-10-CM

## 2013-11-28 DIAGNOSIS — M79652 Pain in left thigh: Secondary | ICD-10-CM

## 2013-11-28 DIAGNOSIS — Z8659 Personal history of other mental and behavioral disorders: Secondary | ICD-10-CM | POA: Diagnosis not present

## 2013-11-28 DIAGNOSIS — Z872 Personal history of diseases of the skin and subcutaneous tissue: Secondary | ICD-10-CM | POA: Insufficient documentation

## 2013-11-28 DIAGNOSIS — G4730 Sleep apnea, unspecified: Secondary | ICD-10-CM | POA: Insufficient documentation

## 2013-11-28 DIAGNOSIS — F17200 Nicotine dependence, unspecified, uncomplicated: Secondary | ICD-10-CM | POA: Insufficient documentation

## 2013-11-28 DIAGNOSIS — Y939 Activity, unspecified: Secondary | ICD-10-CM | POA: Insufficient documentation

## 2013-11-28 DIAGNOSIS — Z8719 Personal history of other diseases of the digestive system: Secondary | ICD-10-CM | POA: Diagnosis not present

## 2013-11-28 DIAGNOSIS — Z8639 Personal history of other endocrine, nutritional and metabolic disease: Secondary | ICD-10-CM | POA: Diagnosis not present

## 2013-11-28 DIAGNOSIS — J4489 Other specified chronic obstructive pulmonary disease: Secondary | ICD-10-CM | POA: Insufficient documentation

## 2013-11-28 DIAGNOSIS — Y929 Unspecified place or not applicable: Secondary | ICD-10-CM | POA: Insufficient documentation

## 2013-11-28 DIAGNOSIS — Y9389 Activity, other specified: Secondary | ICD-10-CM

## 2013-11-28 DIAGNOSIS — W19XXXD Unspecified fall, subsequent encounter: Secondary | ICD-10-CM

## 2013-11-28 DIAGNOSIS — S79919A Unspecified injury of unspecified hip, initial encounter: Secondary | ICD-10-CM | POA: Diagnosis present

## 2013-11-28 DIAGNOSIS — Z8669 Personal history of other diseases of the nervous system and sense organs: Secondary | ICD-10-CM | POA: Diagnosis not present

## 2013-11-28 DIAGNOSIS — J449 Chronic obstructive pulmonary disease, unspecified: Secondary | ICD-10-CM | POA: Diagnosis not present

## 2013-11-28 DIAGNOSIS — Z79899 Other long term (current) drug therapy: Secondary | ICD-10-CM | POA: Insufficient documentation

## 2013-11-28 DIAGNOSIS — R296 Repeated falls: Secondary | ICD-10-CM | POA: Insufficient documentation

## 2013-11-28 DIAGNOSIS — M25552 Pain in left hip: Secondary | ICD-10-CM

## 2013-11-28 DIAGNOSIS — IMO0002 Reserved for concepts with insufficient information to code with codable children: Secondary | ICD-10-CM | POA: Insufficient documentation

## 2013-11-28 DIAGNOSIS — R000 Tachycardia, unspecified: Secondary | ICD-10-CM | POA: Diagnosis not present

## 2013-11-28 HISTORY — DX: Chronic obstructive pulmonary disease, unspecified: J44.9

## 2013-11-28 HISTORY — DX: Sleep apnea, unspecified: G47.30

## 2013-11-28 MED ORDER — CYCLOBENZAPRINE HCL 10 MG PO TABS: 10.0000 mg | ORAL_TABLET | Freq: Two times a day (BID) | ORAL | Status: AC | PRN

## 2013-11-28 NOTE — ED Provider Notes (Signed)
CSN: [id number]     Arrival date & time    History   None    Chief Complaint  Patient presents with  . Fall    unwitnessed fall from [facility]  . Muscle Pain    cramping in left thigh     (Consider location/radiation/quality/duration/timing/severity/associated sxs/prior Treatment) HPI Comments: 48 year old male with a history of COPD who walks with a walker and according to the nursing home is "hunched over when he walks". He rolled out of bed this evening, he remembers rolling, rigors falling, initially complained of pain in his left thigh and states that he has a muscle cramp in that area. He states this is a chronic problem for him and he is unable to walk because of this pain this evening. Paramedics found the patient on the floor, placed him on a stretcher, there is no immobilization, no complaints of numbness or weakness. According to the nursing home the patient was crying out in pain initially however when I asked the patient if he hurts he says I have no pain whatsoever. He denies head injury, neck pain, back pain, abdominal or chest pain and has no fevers chills nausea or vomiting. This fall occurred just prior to arrival.  Patient is a 48 y.o. male presenting with fall and musculoskeletal pain. The history is provided by the patient, the EMS personnel and the nursing home.  Fall  Muscle Pain    Past Medical History  Diagnosis Date  . COPD (chronic obstructive pulmonary disease)   . Sleep apnea    History reviewed. No pertinent past surgical history. History reviewed. No pertinent family history. History  Substance Use Topics  . Smoking status: Not on file  . Smokeless tobacco: Not on file  . Alcohol Use: Not on file    Review of Systems  All other systems reviewed and are negative.     Allergies  Review of patient's allergies indicates no known allergies.  Home Medications   Prior to Admission medications   Medication Sig Start Date End Date Taking?  Authorizing Provider  albuterol (PROVENTIL) (5 MG/ML) 0.5% nebulizer solution Take 2.5 mg by nebulization every 6 (six) hours as needed for wheezing or shortness of breath.   Yes Historical Provider, MD  budesonide (PULMICORT) 180 MCG/ACT inhaler Inhale into the lungs 2 (two) times daily.   Yes Historical Provider, MD  ibuprofen (ADVIL,MOTRIN) 800 MG tablet Take 800 mg by mouth every 8 (eight) hours as needed.   Yes Historical Provider, MD  cyclobenzaprine (FLEXERIL) 10 MG tablet Take 1 tablet (10 mg total) by mouth 2 (two) times daily as needed for muscle spasms. 11/28/13   Vida Roller, MD   BP 142/84  Pulse 111  Temp(Src) 98.2 F (36.8 C) (Oral)  Resp 20  Ht 5\' 2"  (1.575 m)  Wt 190 lb (86.183 kg)  BMI 34.74 kg/m2  SpO2 96% Physical Exam  Nursing note and vitals reviewed. Constitutional: He appears well-developed and well-nourished. No distress.  HENT:  Head: Normocephalic.  Mouth/Throat: Oropharynx is clear and moist. No oropharyngeal exudate.  Erythema over the bridge of the nose in the midforehead, no hematoma, no tenderness, patient states this was an injury that occurred in the past  Eyes: Conjunctivae and EOM are normal. Pupils are equal, round, and reactive to light. Right eye exhibits no discharge. Left eye exhibits no discharge. No scleral icterus.  Neck: Normal range of motion. Neck supple. No JVD present. No thyromegaly present.  Cardiovascular: Normal rate, regular  rhythm, normal heart sounds and intact distal pulses.  Exam reveals no gallop and no friction rub.   No murmur heard. Pulmonary/Chest: Effort normal and breath sounds normal. No respiratory distress. He has no wheezes. He has no rales.  Abdominal: Soft. Bowel sounds are normal. He exhibits no distension and no mass. There is no tenderness.  Musculoskeletal: He exhibits tenderness (tenderness with extension of the left leg, tenderness is proximal thigh, no obvious deformities). He exhibits no edema.  The patient  is able to sit up by himself in the bed, when he does this he is able to straighten both of his legs with minimal pain  Lymphadenopathy:    He has no cervical adenopathy.  Neurological: He is alert. Coordination normal.  Speech is clear, follows commands, no weakness in any of the 4 extremities, cranial nerves III through XII appear intact  Skin: Skin is warm and dry.  Psychiatric: He has a normal mood and affect. His behavior is normal.    ED Course  Procedures (including critical care time) Labs Review Labs Reviewed - No data to display  Imaging Review Dg Hip Complete Left  11/28/2013   CLINICAL DATA:  Status post fall; left hip pain.  EXAM: LEFT HIP - COMPLETE 2+ VIEW  COMPARISON:  None.  FINDINGS: There is no evidence of fracture or dislocation. Both femoral heads are seated normally within their respective acetabula. The proximal left femur appears intact. No significant degenerative change is appreciated. The sacroiliac joints are unremarkable in appearance. An apparent loose body is noted at the superior rim of the left acetabulum. Alternatively, this could reflect a small avulsion fracture.  The visualized bowel gas pattern is grossly unremarkable in appearance.  IMPRESSION: 1. No definite evidence of fracture or dislocation. 2. Apparent loose body noted at the superior rim of the left acetabulum. Alternatively, this could reflect a small avulsion fracture.   Electronically Signed   By: Roanna RaiderJeffery  Chang M.D.   On: 11/28/2013 04:08      MDM   Final diagnoses:  Fall from bed, initial encounter    Rule out pelvic injury or hip injury, patient is hospitalized in a nursing facility, I have spoken with that facility, they state that he has not been that long but has been able to ambulate hunched over with a walker. The patient does not complain of pain but I will check his x-ray to rule out occult fracture given his fall and initial pain.  Xray without major acute findings -   Meds  given in ED:  Medications - No data to display  New Prescriptions   CYCLOBENZAPRINE (FLEXERIL) 10 MG TABLET    Take 1 tablet (10 mg total) by mouth 2 (two) times daily as needed for muscle spasms.      Vida RollerBrian D Rianna Lukes, MD 11/28/13 [phone]

## 2013-11-28 NOTE — ED Notes (Signed)
Per EMS, pt. Was seen here last night for fall. Pt. From [facility]. Pt. Denies new injury. Pt. C/o left leg pain. Pt. Reports that he is usually able to ambulate with walker, but today he had to use his wheelchair to get around. Pt. Alert and oriented x4. No distress noted.

## 2013-11-28 NOTE — ED Notes (Signed)
[facility]

## 2013-11-28 NOTE — ED Notes (Signed)
Joshua QuayShelley Goins on phone with Morene Crockeranita herandez and anita states to have ems bring patient back to facility  Because there is no one at [facility] to transport them back.

## 2013-11-28 NOTE — Discharge Instructions (Signed)
Your xrays shows no fracture of your hip or leg - there was a small chip of bone that may be a chip fracture (no surgery) but because you didn't straighten your leg out for the xray very well, it is hard to tell.  Take the flexeril to help with the pain, rest for 24 hours as needed.

## 2013-11-29 ENCOUNTER — Emergency Department (HOSPITAL_COMMUNITY): Payer: Medicaid Other

## 2013-11-29 MED ORDER — HYDROCODONE-ACETAMINOPHEN 5-325 MG PO TABS: 2.0000 | ORAL_TABLET | ORAL | Status: DC | PRN

## 2013-11-29 MED ORDER — MORPHINE SULFATE 4 MG/ML IJ SOLN: 8.0000 mg | Freq: Once | INTRAMUSCULAR | Status: DC

## 2013-11-29 MED ORDER — SODIUM CHLORIDE 0.9 % IJ SOLN
INTRAMUSCULAR | Status: AC
  Filled 2013-11-29: qty 30

## 2013-11-29 MED ORDER — HYDROCODONE-ACETAMINOPHEN 5-325 MG PO TABS
2.0000 | ORAL_TABLET | Freq: Once | ORAL | Status: DC
  Filled 2013-11-29: qty 2

## 2013-11-29 MED ORDER — HYDROCODONE-ACETAMINOPHEN 5-325 MG PO TABS
2.0000 | ORAL_TABLET | Freq: Once | ORAL | Status: AC
  Administered 2013-11-29: 2 via ORAL

## 2013-11-29 NOTE — ED Notes (Signed)
Pt. Refused to ambulate. EDP notified.

## 2013-11-29 NOTE — ED Notes (Signed)
[facility] unable to provide transportation. [facility] called to transport pt. Back to [facility].

## 2013-11-29 NOTE — ED Notes (Signed)
Maria from [facility] stated that she was unsure if they would be able to provide transportation for the pt. Back to the facility. She stated that she would call back shortly to notify us if they were able to transport pt.

## 2013-11-29 NOTE — Discharge Instructions (Signed)
Take your cyclobenzaprine and use warm packs for muscle spasm and ache. Take ibuprofen and Tylenol for pain. Return to the ER for assessment of emergent MRI if you develop leg weakness, bowel or bladder changes, fevers or worsening symptoms.  If you were given medicines take as directed.  If you are on coumadin or contraceptives realize their levels and effectiveness is altered by many different medicines.  If you have any reaction (rash, tongues swelling, other) to the medicines stop taking and see a physician.   Please follow up as directed and return to the ER or see a physician for new or worsening symptoms.  Thank you. Filed Vitals:   11/28/13 2307  BP: 137/84  Pulse: 118  Temp: 97.7 F (36.5 C)  TempSrc: Oral  Resp: 18  Height: 5\' 2"  (1.575 m)  Weight: 200 lb (90.719 kg)  SpO2: 91%

## 2013-11-29 NOTE — ED Provider Notes (Signed)
CSN: [id number]     Arrival date & time 11/28/13  2307 History   First MD Initiated Contact with Patient 11/28/13 2331    This chart was scribed for Enid Skeens, MD by Marica Otter, ED Scribe. This patient was seen in room APA06/APA06 and the patient's care was started at 12:04 AM.  Chief Complaint  Patient presents with  . Leg Pain  PCP: Default, Provider, MD  The history is provided by a relative and the patient. No language interpreter was used.   HPI Comments: [name] is a 48 y.o. male, who is a resident at [facility], brought in by ambulance, who presents to the Emergency Department complaining of a fall onset yesterday. Pt complains of associated pain to his left leg. Pt reports that he rolled out of bed last night and fell. Pt further reports that he walks with a walker at baseline. Pt denies fever, chills, SOB, abd pain.   Pt was seen at the ED yesterday for the same.    Past Medical History  Diagnosis Date  . Acute respiratory failure   . Hypoxemia   . Encephalopathy, unspecified   . Pain in limb   . Hyperosmolality and/or hypernatremia   . Dehydration   . Altered mental status   . Other specific developmental learning difficulties   . Acidosis   . Gallstone ileus   . Unspecified vitamin B deficiency   . Tobacco use disorder   . Acute conjunctivitis, unspecified   . Other chronic allergic conjunctivitis   . Unspecified essential hypertension   . Cellulitis and abscess of unspecified site   . Muscle weakness (generalized)   . Cognitive communication deficit   . COPD (chronic obstructive pulmonary disease)   . Sleep apnea    Past Surgical History  Procedure Laterality Date  . Eye surgery      cataracts   Family History  Problem Relation Age of Onset  . Lung cancer Father     deceased  . Diabetes Mother    History  Substance Use Topics  . Smoking status: Not on file  . Smokeless tobacco: Not on file  . Alcohol Use: No    Review  of Systems  Constitutional: Negative for fever and chills.  Respiratory: Negative for shortness of breath.   Gastrointestinal: Negative for abdominal pain.  Musculoskeletal:       Left leg pain  All other systems reviewed and are negative.     Allergies  Review of patient's allergies indicates no known allergies.  Home Medications   Prior to Admission medications   Medication Sig Start Date End Date Taking? Authorizing Provider  acetaminophen (TYLENOL) 325 MG tablet Take 2 tablets (650 mg total) by mouth every 6 (six) hours as needed. 09/24/12   Tora Kindred York, PA-C  albuterol (PROVENTIL) (5 MG/ML) 0.5% nebulizer solution Take 0.5 mLs (2.5 mg total) by nebulization every 6 (six) hours as needed for wheezing. 09/24/12   Tora Kindred York, PA-C  albuterol (PROVENTIL) (5 MG/ML) 0.5% nebulizer solution Take 2.5 mg by nebulization every 6 (six) hours as needed for wheezing or shortness of breath.    Historical Provider, MD  budesonide (PULMICORT) 0.25 MG/2ML nebulizer solution Take 2 mLs (0.25 mg total) by nebulization 2 (two) times daily. 09/24/12   Tora Kindred York, PA-C  budesonide (PULMICORT) 180 MCG/ACT inhaler Inhale into the lungs 2 (two) times daily.    Historical Provider, MD  cyanocobalamin (,VITAMIN B-12,) 1000 MCG/ML injection Inject 1  mL (1,000 mcg total) into the skin daily. 09/24/12   Tora KindredMarianne L York, PA-C  cyclobenzaprine (FLEXERIL) 10 MG tablet Take 1 tablet (10 mg total) by mouth 2 (two) times daily as needed for muscle spasms. 11/28/13   Vida RollerBrian D Miller, MD  folic acid (FOLVITE) 1 MG tablet Take 1 tablet (1 mg total) by mouth daily. 09/24/12   Tora KindredMarianne L York, PA-C  ibuprofen (ADVIL,MOTRIN) 800 MG tablet Take 800 mg by mouth every 8 (eight) hours as needed.    Historical Provider, MD  ipratropium (ATROVENT) 0.02 % nebulizer solution Take 2.5 mLs (0.5 mg total) by nebulization 4 (four) times daily as needed for wheezing. 09/24/12   Tora KindredMarianne L York, PA-C  neomycin-polymyxin  b-dexamethasone (MAXITROL) 3.5-10000-0.1 OINT Place 1 application into both eyes 6 (six) times daily. 09/24/12   Tora KindredMarianne L York, PA-C  olopatadine (PATANOL) 0.1 % ophthalmic solution Place 1 drop into both eyes 2 (two) times daily. 09/24/12   Stephani PoliceMarianne L York, PA-C   Triage Vitals: BP 137/84  Pulse 118  Temp(Src) 97.7 F (36.5 C) (Oral)  Resp 18  Ht 5\' 2"  (1.575 m)  Wt 200 lb (90.719 kg)  BMI 36.57 kg/m2  SpO2 91% Physical Exam  Nursing note and vitals reviewed. Constitutional: He is oriented to person, place, and time. He appears well-developed and well-nourished.  HENT:  Head: Normocephalic and atraumatic.  Eyes: EOM are normal. Pupils are equal, round, and reactive to light.  Visual fields intact  Neck: Normal range of motion.  No bony tenderness in neck.   Cardiovascular: Regular rhythm and intact distal pulses.  Tachycardia present.   Pulmonary/Chest: Effort normal and breath sounds normal. No respiratory distress.  Abdominal: Soft. He exhibits no distension. There is no tenderness.  Musculoskeletal: Normal range of motion. He exhibits tenderness.  No step offs. Tenderness mid shaft left thigh, no obvious deformities; compartments are soft in both legs; both knees bent for comfort position, good cap refill in toes.    Neurological: He is alert and oriented to person, place, and time. GCS eye subscore is 4. GCS verbal subscore is 5. GCS motor subscore is 6.  Sensations intact in upper and lower extremities.  Speech is clear, follows commands, no weakness in any of the 4 extremities, cranial nerves III through XII appear intact  5+ strength LE with hip flexion/ extension, knee flexion/ ext, great toe extension  Skin: Skin is warm and dry.  Psychiatric:  Mild slowing, baseline per family    ED Course  Procedures (including critical care time)  COORDINATION OF CARE: 12:10 AM: Conducted physical examination with chaperone present.  12:17 AM-Discussed treatment plan which  includes imaging with pt and pt's sister at bedside and they agreed to plan.  Labs Review Labs Reviewed - No data to display  Imaging Review Dg Hip Complete Left  11/28/2013   CLINICAL DATA:  Status post fall; left hip pain.  EXAM: LEFT HIP - COMPLETE 2+ VIEW  COMPARISON:  None.  FINDINGS: There is no evidence of fracture or dislocation. Both femoral heads are seated normally within their respective acetabula. The proximal left femur appears intact. No significant degenerative change is appreciated. The sacroiliac joints are unremarkable in appearance. An apparent loose body is noted at the superior rim of the left acetabulum. Alternatively, this could reflect a small avulsion fracture.  The visualized bowel gas pattern is grossly unremarkable in appearance.  IMPRESSION: 1. No definite evidence of fracture or dislocation. 2. Apparent loose body noted at the  superior rim of the left acetabulum. Alternatively, this could reflect a small avulsion fracture.   Electronically Signed   By: Roanna Raider M.D.   On: 11/28/2013 04:08   Dg Femur Left  11/29/2013   CLINICAL DATA:  Pain extending from the left hip to the knee after falling yesterday.  EXAM: LEFT FEMUR - 2 VIEW  COMPARISON:  Radiographs 12/01/2003.  FINDINGS: Stable coxa magna deformity. No evidence of acute fracture, dislocation or femoral head avascular necrosis. Os acetabuli appears unchanged. Tricompartmental degenerative changes are present at the knee, most advanced within the patellofemoral compartment.  IMPRESSION: No acute osseous findings demonstrated.   Electronically Signed   By: Roxy Horseman M.D.   On: 11/29/2013 02:01   Ct Pelvis Wo Contrast  11/29/2013   CLINICAL DATA:  Left hip pain after falling from bed. Mentally challenged.  EXAM: CT PELVIS WITHOUT CONTRAST  TECHNIQUE: Multidetector CT imaging of the pelvis was performed following the standard protocol without intravenous contrast.  COMPARISON:  Radiographs 12/01/2003 and  11/29/2013.  FINDINGS: There are apparent bilateral hip contractures. The utilized thin section technique is suboptimal for the patient's body habitus and these contractures, resulting in decreased signal to noise.  There is no evidence of acute fracture or dislocation. There is no evidence of femoral head avascular necrosis. 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  There is a grade 2 anterolisthesis at L5-S1 secondary to bilateral L5 pars defects. There is severe degenerative disc disease at L5-S1 with endplate sclerosis and vacuum phenomenon. There is severe biforaminal stenosis and probable bilateral L5 nerve root encroachment.  No periarticular hematoma is identified. Central prostatic calcifications and moderate bladder distention are noted. The visualized internal pelvic contents are otherwise unremarkable.  IMPRESSION: 1. No demonstrated acute osseous findings in the pelvis. 2. 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 3. Bilateral L5 pars defects with resulting grade 2 anterolisthesis and severe biforaminal stenosis at L5-S1. There is probable bilateral L5 nerve root encroachment. 4. Central prostatic calcifications.   Electronically Signed   By: Roxy Horseman M.D.   On: 11/29/2013 02:14     EKG Interpretation None      MDM   Final diagnoses:  Left thigh pain  Fall, subsequent encounter  Hip pain, acute, left  Foraminal stenosis of lumbar region   I personally performed the services described in this documentation, which was scribed in my presence. The recorded information has been reviewed and is accurate.  Patient with worsening left hip and thigh pain since fall yesterday. Patient had x-ray of left hip done, results reviewed no acute findings. CT scan pelvis showed no acute fracture and severe by foraminal stenosis. Patient has normal strength, sensation and denies urinary changes in the  ER. Clinically the pain started since the fall however severe stenosis, also cause significant pain. Discussed followup outpatient closely with primary in orthopedics discussed at reasons to return including urinary changes leg weakness numbness etc. Pain improved significantly in ER and patient felt it was mild. Patient does not have evidence of acute limb ischemia, infection or compartment syndrome. Mild tachycardia 105 on discharge likely discomfort related. Patient will follow closely outpatient. X-ray femur reviewed no acute fracture.  Results and differential diagnosis were discussed with the patient/parent/guardian. Close follow up outpatient was discussed, comfortable with the plan.   Medications  HYDROcodone-acetaminophen (NORCO/VICODIN) 5-325 MG per tablet 2 tablet (2 tablets Oral Given 11/29/13 0026)  Filed Vitals:   11/28/13 2307 11/29/13 0302  BP: 137/84 121/70  Pulse: 118 105  Temp: 97.7 F (36.5 C)   TempSrc: Oral   Resp: 18 18  Height: 5\' 2"  (1.575 m)   Weight: 200 lb (90.719 kg)   SpO2: 91% 93%       Enid Skeens, MD 11/29/13 [phone]

## 2013-11-30 ENCOUNTER — Encounter (HOSPITAL_COMMUNITY): Payer: Self-pay | Admitting: Emergency Medicine

## 2013-11-30 ENCOUNTER — Emergency Department (HOSPITAL_COMMUNITY): Payer: Medicaid Other

## 2013-11-30 ENCOUNTER — Emergency Department (HOSPITAL_COMMUNITY)
Admission: EM | Admit: 2013-11-30 | Discharge: 2013-11-30 | Disposition: A | Discharge status: Home / Self Care | Payer: Medicaid Other | Attending: Emergency Medicine | Admitting: Emergency Medicine

## 2013-11-30 DIAGNOSIS — Z872 Personal history of diseases of the skin and subcutaneous tissue: Secondary | ICD-10-CM | POA: Insufficient documentation

## 2013-11-30 DIAGNOSIS — Y9389 Activity, other specified: Secondary | ICD-10-CM | POA: Insufficient documentation

## 2013-11-30 DIAGNOSIS — S79919A Unspecified injury of unspecified hip, initial encounter: Secondary | ICD-10-CM | POA: Insufficient documentation

## 2013-11-30 DIAGNOSIS — S79929A Unspecified injury of unspecified thigh, initial encounter: Secondary | ICD-10-CM

## 2013-11-30 DIAGNOSIS — W06XXXA Fall from bed, initial encounter: Secondary | ICD-10-CM | POA: Diagnosis not present

## 2013-11-30 DIAGNOSIS — Z79899 Other long term (current) drug therapy: Secondary | ICD-10-CM | POA: Diagnosis not present

## 2013-11-30 DIAGNOSIS — Y9210 Unspecified residential institution as the place of occurrence of the external cause: Secondary | ICD-10-CM | POA: Diagnosis not present

## 2013-11-30 DIAGNOSIS — Z8719 Personal history of other diseases of the digestive system: Secondary | ICD-10-CM | POA: Insufficient documentation

## 2013-11-30 DIAGNOSIS — Z862 Personal history of diseases of the blood and blood-forming organs and certain disorders involving the immune mechanism: Secondary | ICD-10-CM | POA: Diagnosis not present

## 2013-11-30 DIAGNOSIS — W19XXXA Unspecified fall, initial encounter: Secondary | ICD-10-CM

## 2013-11-30 DIAGNOSIS — S0990XA Unspecified injury of head, initial encounter: Secondary | ICD-10-CM | POA: Insufficient documentation

## 2013-11-30 DIAGNOSIS — Z8669 Personal history of other diseases of the nervous system and sense organs: Secondary | ICD-10-CM | POA: Diagnosis not present

## 2013-11-30 DIAGNOSIS — Y92129 Unspecified place in nursing home as the place of occurrence of the external cause: Secondary | ICD-10-CM

## 2013-11-30 DIAGNOSIS — IMO0002 Reserved for concepts with insufficient information to code with codable children: Secondary | ICD-10-CM | POA: Insufficient documentation

## 2013-11-30 DIAGNOSIS — Z8739 Personal history of other diseases of the musculoskeletal system and connective tissue: Secondary | ICD-10-CM | POA: Insufficient documentation

## 2013-11-30 DIAGNOSIS — I10 Essential (primary) hypertension: Secondary | ICD-10-CM | POA: Diagnosis not present

## 2013-11-30 DIAGNOSIS — J4489 Other specified chronic obstructive pulmonary disease: Secondary | ICD-10-CM | POA: Insufficient documentation

## 2013-11-30 DIAGNOSIS — J449 Chronic obstructive pulmonary disease, unspecified: Secondary | ICD-10-CM | POA: Insufficient documentation

## 2013-11-30 DIAGNOSIS — R319 Hematuria, unspecified: Secondary | ICD-10-CM | POA: Diagnosis not present

## 2013-11-30 DIAGNOSIS — Z8639 Personal history of other endocrine, nutritional and metabolic disease: Secondary | ICD-10-CM | POA: Insufficient documentation

## 2013-11-30 DIAGNOSIS — F17200 Nicotine dependence, unspecified, uncomplicated: Secondary | ICD-10-CM | POA: Insufficient documentation

## 2013-11-30 LAB — CBC WITH DIFFERENTIAL/PLATELET
BASOS ABS: 0 10*3/uL (ref 0.0–0.1)
BASOS PCT: 0 % (ref 0–1)
Eosinophils Absolute: 0.2 10*3/uL (ref 0.0–0.7)
Eosinophils Relative: 2 % (ref 0–5)
HCT: 38.7 % — ABNORMAL LOW (ref 39.0–52.0)
Hemoglobin: 12.6 g/dL — ABNORMAL LOW (ref 13.0–17.0)
Lymphocytes Relative: 19 % (ref 12–46)
Lymphs Abs: 1.9 10*3/uL (ref 0.7–4.0)
MCH: 32.2 pg (ref 26.0–34.0)
MCHC: 32.6 g/dL (ref 30.0–36.0)
MCV: 99 fL (ref 78.0–100.0)
Monocytes Absolute: 0.9 10*3/uL (ref 0.1–1.0)
Monocytes Relative: 9 % (ref 3–12)
Neutro Abs: 6.9 10*3/uL (ref 1.7–7.7)
Neutrophils Relative %: 70 % (ref 43–77)
PLATELETS: 238 10*3/uL (ref 150–400)
RBC: 3.91 MIL/uL — ABNORMAL LOW (ref 4.22–5.81)
RDW: 14.7 % (ref 11.5–15.5)
WBC: 10 10*3/uL (ref 4.0–10.5)

## 2013-11-30 LAB — COMPREHENSIVE METABOLIC PANEL
ALBUMIN: 3.5 g/dL (ref 3.5–5.2)
ALK PHOS: 117 U/L (ref 39–117)
ALT: 29 U/L (ref 0–53)
AST: 39 U/L — ABNORMAL HIGH (ref 0–37)
Anion gap: 10 (ref 5–15)
BUN: 24 mg/dL — ABNORMAL HIGH (ref 6–23)
CO2: 26 mEq/L (ref 19–32)
Calcium: 9.2 mg/dL (ref 8.4–10.5)
Chloride: 112 mEq/L (ref 96–112)
Creatinine, Ser: 1.28 mg/dL (ref 0.50–1.35)
GFR calc Af Amer: 76 mL/min — ABNORMAL LOW (ref >90)
GFR calc non Af Amer: 65 mL/min — ABNORMAL LOW (ref >90)
Glucose, Bld: 143 mg/dL — ABNORMAL HIGH (ref 70–99)
POTASSIUM: 4.4 meq/L (ref 3.7–5.3)
SODIUM: 148 meq/L — AB (ref 137–147)
TOTAL PROTEIN: 7.2 g/dL (ref 6.0–8.3)
Total Bilirubin: <0.2 mg/dL — ABNORMAL LOW (ref 0.3–1.2)

## 2013-11-30 LAB — URINALYSIS, ROUTINE W REFLEX MICROSCOPIC
Bilirubin Urine: NEGATIVE
Glucose, UA: NEGATIVE mg/dL
Ketones, ur: NEGATIVE mg/dL
Leukocytes, UA: NEGATIVE
NITRITE: NEGATIVE
PH: 6 (ref 5.0–8.0)
Protein, ur: 30 mg/dL — AB
SPECIFIC GRAVITY, URINE: 1.015 (ref 1.005–1.030)
Urobilinogen, UA: 0.2 mg/dL (ref 0.0–1.0)

## 2013-11-30 LAB — URINE MICROSCOPIC-ADD ON

## 2013-11-30 NOTE — ED Provider Notes (Signed)
CSN: [id number]     Arrival date & time 11/30/13  1144 History  This chart was scribed for Ward GivensIva L Baelynn Schmuhl, MD by [place] Peace, ED Scribe. The patient was seen in [facility]. The patient's care was started at 12:35 PM.    Chief Complaint  Patient presents with  . Fall   Level 5 Caveat for encephalopathy   Patient is a 48 y.o. male presenting with fall. The history is provided by the patient. No language interpreter was used.  Fall  HPI Comments: [name] is a 48 y.o. male who lives in [facility] presents to the Emergency Department complaining of falling out of his wheelchair this morning and injuring himself. He states he was sitting outside and fell asleep while sitting in his wheelchair when he fell out of his chair.Patient is complaining of pain to his "private parts" ,left hip and pelvis. He reports that he is not sure where he was hit  when he fell. Pt was seen 2 days ago for same issue when he fell out of bed and injured his left hip. He states that he is a smoker (2 packs x daily). He reports no drinking in past 20 years  PCP Dr Felecia ShellingFanta  Past Medical History  Diagnosis Date  . Acute respiratory failure   . Hypoxemia   . Encephalopathy, unspecified   . Pain in limb   . Hyperosmolality and/or hypernatremia   . Dehydration   . Altered mental status   . Other specific developmental learning difficulties   . Acidosis   . Gallstone ileus   . Unspecified vitamin B deficiency   . Tobacco use disorder   . Acute conjunctivitis, unspecified   . Other chronic allergic conjunctivitis   . Unspecified essential hypertension   . Cellulitis and abscess of unspecified site   . Muscle weakness (generalized)   . Cognitive communication deficit   . COPD (chronic obstructive pulmonary disease)   . Sleep apnea    Past Surgical History  Procedure Laterality Date  . Eye surgery      cataracts   Family History  Problem Relation Age of Onset  . Lung cancer  Father     deceased  . Diabetes Mother    History  Substance Use Topics  . Smoking status: Not on file  . Smokeless tobacco: Not on file  . Alcohol Use: No  pt uses a wheelchair. Pt lives in a nursing home  Pt smokes 2 ppd Denies ETOH  Review of Systems  Unable to perform ROS: Other      Allergies  Review of patient's allergies indicates no known allergies.  Home Medications   Prior to Admission medications   Medication Sig Start Date End Date Taking? Authorizing Provider  albuterol (PROVENTIL) (5 MG/ML) 0.5% nebulizer solution Take 0.5 mLs (2.5 mg total) by nebulization every 6 (six) hours as needed for wheezing. 09/24/12  Yes Marianne L York, PA-C  budesonide (PULMICORT) 0.25 MG/2ML nebulizer solution Take 2 mLs (0.25 mg total) by nebulization 2 (two) times daily. 09/24/12  Yes Marianne L York, PA-C  budesonide (PULMICORT) 180 MCG/ACT inhaler Inhale into the lungs 2 (two) times daily.   Yes Historical Provider, MD  cyclobenzaprine (FLEXERIL) 10 MG tablet Take 1 tablet (10 mg total) by mouth 2 (two) times daily as needed for muscle spasms. 11/28/13  Yes Vida RollerBrian D Miller, MD  ibuprofen (ADVIL,MOTRIN) 800 MG tablet Take 800 mg by mouth every 8 (eight) hours as needed.  Yes Historical Provider, MD   BP 153/101  Pulse 127  Temp(Src) 98.4 F (36.9 C) (Oral)  Resp 24  Ht 5\' 2"  (1.575 m)  Wt 200 lb (90.719 kg)  BMI 36.57 kg/m2  SpO2 88%  Vital signs normal except for tachycardia  Physical Exam  Nursing note and vitals reviewed. Constitutional: He appears well-developed and well-nourished.  Non-toxic appearance. He does not appear ill. No distress.  Pt is laying on his side quietly  HENT:  Head: Normocephalic and atraumatic.  Right Ear: External ear normal.  Left Ear: External ear normal.  Nose: Nose normal. No mucosal edema or rhinorrhea.  Mouth/Throat: Oropharynx is clear and moist and mucous membranes are normal. No dental abscesses or uvula swelling.  Eyes:  Conjunctivae and EOM are normal. Pupils are equal, round, and reactive to light.  Neck: Normal range of motion and full passive range of motion without pain. Neck supple.  Cardiovascular: Normal rate, regular rhythm and normal heart sounds.  Exam reveals no gallop and no friction rub.   No murmur heard. Pulmonary/Chest: Effort normal. No respiratory distress. He has no rhonchi. He exhibits no crepitus.  Abdominal: Soft. Normal appearance. He exhibits no distension.  Genitourinary:  NO obvious trauma to genitalia.   Musculoskeletal: Normal range of motion. He exhibits no edema and no tenderness.  Keep legs flexed at the knees and hips, not able to straighten them out.    Neurological: He is alert. He has normal strength. No cranial nerve deficit.  Skin: Skin is warm, dry and intact. No rash noted. No erythema. No pallor.  Abrasion to  Mid forehead and along bridge of his nose from previous falls.   Psychiatric: He has a normal mood and affect. His speech is normal and behavior is normal. His mood appears not anxious.    ED Course  Procedures (including critical care time)  Level 5 Caveat  Medications - No data to display   DIAGNOSTIC STUDIES: Oxygen Saturation is 88% on room air, low by my interpretation.    COORDINATION OF CARE: 12:40 PM- Treatment plan was discussed with patient who verbalizes understanding and agrees.   Pt was seen twice in the ED for falling out of his bed and injuring his left thigh on 7/23.   17:30 pt states he has no pain now. States he is ready to go back to his facility. He is trying to urinate. His pulse ox was low on arrival then was normal. He is currently not on oxygen. His color is good.   17:40 Dr Adriana Simas will check his UA and repeat VS.   Labs Review Results for orders placed during the hospital encounter of 11/30/13  CBC WITH DIFFERENTIAL      Result Value Ref Range   WBC 10.0  4.0 - 10.5 K/uL   RBC 3.91 (*) 4.22 - 5.81 MIL/uL   Hemoglobin 12.6  (*) 13.0 - 17.0 g/dL   HCT 16.1 (*) 09.6 - 04.5 %   MCV 99.0  78.0 - 100.0 fL   MCH 32.2  26.0 - 34.0 pg   MCHC 32.6  30.0 - 36.0 g/dL   RDW 40.9  81.1 - 91.4 %   Platelets 238  150 - 400 K/uL   Neutrophils Relative % 70  43 - 77 %   Neutro Abs 6.9  1.7 - 7.7 K/uL   Lymphocytes Relative 19  12 - 46 %   Lymphs Abs 1.9  0.7 - 4.0 K/uL   Monocytes Relative 9  3 - 12 %   Monocytes Absolute 0.9  0.1 - 1.0 K/uL   Eosinophils Relative 2  0 - 5 %   Eosinophils Absolute 0.2  0.0 - 0.7 K/uL   Basophils Relative 0  0 - 1 %   Basophils Absolute 0.0  0.0 - 0.1 K/uL  COMPREHENSIVE METABOLIC PANEL      Result Value Ref Range   Sodium 148 (*) 137 - 147 mEq/L   Potassium 4.4  3.7 - 5.3 mEq/L   Chloride 112  96 - 112 mEq/L   CO2 26  19 - 32 mEq/L   Glucose, Bld 143 (*) 70 - 99 mg/dL   BUN 24 (*) 6 - 23 mg/dL   Creatinine, Ser 1.61  0.50 - 1.35 mg/dL   Calcium 9.2  8.4 - 09.6 mg/dL   Total Protein 7.2  6.0 - 8.3 g/dL   Albumin 3.5  3.5 - 5.2 g/dL   AST 39 (*) 0 - 37 U/L   ALT 29  0 - 53 U/L   Alkaline Phosphatase 117  39 - 117 U/L   Total Bilirubin <0.2 (*) 0.3 - 1.2 mg/dL   GFR calc non Af Amer 65 (*) >90 mL/min   GFR calc Af Amer 76 (*) >90 mL/min   Anion gap 10  5 - 15   Laboratory interpretation all normal except mildly elevated sodium which is chronic  Dg Femur Left  11/29/2013   CLINICAL DATA:  Pain extending from the left hip to the knee after falling yesterday.  EXAM: LEFT FEMUR - 2 VIEW  COMPARISON:  Radiographs 12/01/2003.  FINDINGS: Stable coxa magna deformity. No evidence of acute fracture, dislocation or femoral head avascular necrosis. Os acetabuli appears unchanged. Tricompartmental degenerative changes are present at the knee, most advanced within the patellofemoral compartment.  IMPRESSION: No acute osseous findings demonstrated.   Electronically Signed   By: Roxy Horseman M.D.   On: 11/29/2013 02:01    Ct Pelvis Wo Contrast  11/29/2013   CLINICAL DATA:  Left hip pain after  falling from bed. Mentally challenged.  EXAM: CT PELVIS WITHOUT CONTRAST  TECHNIQUE: Multidetector CT imaging of the pelvis was performed following the standard protocol without intravenous contrast.  COMPARISON:  Radiographs 12/01/2003 and 11/29/2013.  FINDINGS: There are apparent bilateral hip contractures. The utilized thin section technique is suboptimal for the patient's body habitus and these contractures, resulting in decreased signal to noise.  There is no evidence of acute fracture or dislocation. There is no evidence of femoral head avascular necrosis. 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  There is a grade 2 anterolisthesis at L5-S1 secondary to bilateral L5 pars defects. There is severe degenerative disc disease at L5-S1 with endplate sclerosis and vacuum phenomenon. There is severe biforaminal stenosis and probable bilateral L5 nerve root encroachment.  No periarticular hematoma is identified. Central prostatic calcifications and moderate bladder distention are noted. The visualized internal pelvic contents are otherwise unremarkable.  IMPRESSION: 1. No demonstrated acute osseous findings in the pelvis. 2. 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 3. Bilateral L5 pars defects with resulting grade 2 anterolisthesis and severe biforaminal stenosis at L5-S1. There is probable bilateral L5 nerve root encroachment. 4. Central prostatic calcifications.   Electronically Signed   By: Roxy Horseman M.D.   On: 11/29/2013 02:14   Dg Hip Complete Left  11/28/2013   CLINICAL DATA:  Status post  fall; left hip pain.  IMPRESSION: 1. No definite evidence of fracture or dislocation. 2. Apparent loose body noted at the superior rim of the left acetabulum. Alternatively, this could reflect a small avulsion fracture.   Electronically Signed   By: Roanna Raider M.D.   On: 11/28/2013 04:08      EKG  Interpretation None      Level 5 Caveat  MDM   Final diagnoses:  Fall at nursing home, initial encounter   Plan discharge per Dr Jill Poling, MD, FACEP    I personally performed the services described in this documentation, which was scribed in my presence. The recorded information has been reviewed and considered.  Devoria Albe, MD, FACEP    Ward Givens, MD 11/30/13 [phone]

## 2013-11-30 NOTE — Discharge Instructions (Signed)
Recheck as needed. Don't sit in your wheelchair if you are sleepy! Or consider getting a seat belt on your wheelchair so you don't fall out!.    There is some blood in your urine.  This will need to be rechecked in 1-2 weeks.

## 2013-11-30 NOTE — ED Provider Notes (Signed)
No change in physical exam. Hematuria noted. Discussed with patient. Encouraged recheck of urinalysis in 1-2 weeks the  Donnetta HutchingBrian Judyth Demarais, MD 11/30/13 [phone]

## 2013-11-30 NOTE — ED Notes (Signed)
Repositioned pt on his side with pillows.  Pt calmer and states he feels a little better.

## 2013-11-30 NOTE — ED Notes (Signed)
From [facility].  Multiple falls over the past few days.  Larey SeatFell this morning outside and c/o pain to left thigh and pelvic area.   Pt b/p 190/102 and cbg 145 with ems.  Multiple scrapes and abrasions from previous falls.

## 2013-11-30 NOTE — ED Notes (Signed)
Spoke with Lurena Joinerebecca at [facility] and informed them that pt was going to be discharged. They will be coming to get pt.

## 2013-12-01 ENCOUNTER — Emergency Department (HOSPITAL_COMMUNITY): Payer: Medicaid Other

## 2013-12-01 ENCOUNTER — Encounter (HOSPITAL_COMMUNITY): Payer: Self-pay | Admitting: Emergency Medicine

## 2013-12-01 ENCOUNTER — Emergency Department (HOSPITAL_COMMUNITY)
Admission: EM | Admit: 2013-12-01 | Discharge: 2013-12-01 | Disposition: A | Discharge status: Home / Self Care | Payer: Medicaid Other | Source: Home / Self Care | Attending: Emergency Medicine | Admitting: Emergency Medicine

## 2013-12-01 DIAGNOSIS — W050XXA Fall from non-moving wheelchair, initial encounter: Secondary | ICD-10-CM | POA: Insufficient documentation

## 2013-12-01 DIAGNOSIS — IMO0002 Reserved for concepts with insufficient information to code with codable children: Secondary | ICD-10-CM | POA: Insufficient documentation

## 2013-12-01 DIAGNOSIS — J449 Chronic obstructive pulmonary disease, unspecified: Secondary | ICD-10-CM

## 2013-12-01 DIAGNOSIS — Z79899 Other long term (current) drug therapy: Secondary | ICD-10-CM

## 2013-12-01 DIAGNOSIS — Z23 Encounter for immunization: Secondary | ICD-10-CM | POA: Insufficient documentation

## 2013-12-01 DIAGNOSIS — Z91199 Patient's noncompliance with other medical treatment and regimen due to unspecified reason: Secondary | ICD-10-CM

## 2013-12-01 DIAGNOSIS — Z9181 History of falling: Secondary | ICD-10-CM

## 2013-12-01 DIAGNOSIS — Z872 Personal history of diseases of the skin and subcutaneous tissue: Secondary | ICD-10-CM

## 2013-12-01 DIAGNOSIS — M47812 Spondylosis without myelopathy or radiculopathy, cervical region: Secondary | ICD-10-CM | POA: Diagnosis present

## 2013-12-01 DIAGNOSIS — W19XXXD Unspecified fall, subsequent encounter: Secondary | ICD-10-CM

## 2013-12-01 DIAGNOSIS — D539 Nutritional anemia, unspecified: Secondary | ICD-10-CM | POA: Diagnosis present

## 2013-12-01 DIAGNOSIS — F17200 Nicotine dependence, unspecified, uncomplicated: Secondary | ICD-10-CM

## 2013-12-01 DIAGNOSIS — Z9119 Patient's noncompliance with other medical treatment and regimen: Secondary | ICD-10-CM

## 2013-12-01 DIAGNOSIS — Z8639 Personal history of other endocrine, nutritional and metabolic disease: Secondary | ICD-10-CM

## 2013-12-01 DIAGNOSIS — Z808 Family history of malignant neoplasm of other organs or systems: Secondary | ICD-10-CM

## 2013-12-01 DIAGNOSIS — S0993XA Unspecified injury of face, initial encounter: Secondary | ICD-10-CM

## 2013-12-01 DIAGNOSIS — Q762 Congenital spondylolisthesis: Secondary | ICD-10-CM

## 2013-12-01 DIAGNOSIS — J4489 Other specified chronic obstructive pulmonary disease: Secondary | ICD-10-CM | POA: Insufficient documentation

## 2013-12-01 DIAGNOSIS — S0081XA Abrasion of other part of head, initial encounter: Secondary | ICD-10-CM

## 2013-12-01 DIAGNOSIS — S79919A Unspecified injury of unspecified hip, initial encounter: Secondary | ICD-10-CM

## 2013-12-01 DIAGNOSIS — S79929A Unspecified injury of unspecified thigh, initial encounter: Secondary | ICD-10-CM

## 2013-12-01 DIAGNOSIS — Z8739 Personal history of other diseases of the musculoskeletal system and connective tissue: Secondary | ICD-10-CM | POA: Insufficient documentation

## 2013-12-01 DIAGNOSIS — Z862 Personal history of diseases of the blood and blood-forming organs and certain disorders involving the immune mechanism: Secondary | ICD-10-CM | POA: Insufficient documentation

## 2013-12-01 DIAGNOSIS — Z8719 Personal history of other diseases of the digestive system: Secondary | ICD-10-CM | POA: Insufficient documentation

## 2013-12-01 DIAGNOSIS — Z801 Family history of malignant neoplasm of trachea, bronchus and lung: Secondary | ICD-10-CM

## 2013-12-01 DIAGNOSIS — Y929 Unspecified place or not applicable: Secondary | ICD-10-CM | POA: Insufficient documentation

## 2013-12-01 DIAGNOSIS — M6282 Rhabdomyolysis: Principal | ICD-10-CM | POA: Diagnosis present

## 2013-12-01 DIAGNOSIS — S0990XA Unspecified injury of head, initial encounter: Secondary | ICD-10-CM | POA: Insufficient documentation

## 2013-12-01 DIAGNOSIS — Z833 Family history of diabetes mellitus: Secondary | ICD-10-CM

## 2013-12-01 DIAGNOSIS — Y9389 Activity, other specified: Secondary | ICD-10-CM

## 2013-12-01 DIAGNOSIS — G4733 Obstructive sleep apnea (adult) (pediatric): Secondary | ICD-10-CM | POA: Diagnosis present

## 2013-12-01 DIAGNOSIS — Z8669 Personal history of other diseases of the nervous system and sense organs: Secondary | ICD-10-CM

## 2013-12-01 DIAGNOSIS — S199XXA Unspecified injury of neck, initial encounter: Secondary | ICD-10-CM

## 2013-12-01 DIAGNOSIS — F70 Mild intellectual disabilities: Secondary | ICD-10-CM | POA: Diagnosis present

## 2013-12-01 MED ORDER — OXYCODONE-ACETAMINOPHEN 5-325 MG PO TABS
1.0000 | ORAL_TABLET | Freq: Once | ORAL | Status: AC
  Administered 2013-12-01: 1 via ORAL
  Filled 2013-12-01: qty 1

## 2013-12-01 MED ORDER — BACITRACIN ZINC 500 UNIT/GM EX OINT
TOPICAL_OINTMENT | CUTANEOUS | Status: AC
  Administered 2013-12-01: 1 via TOPICAL
  Filled 2013-12-01: qty 0.9

## 2013-12-01 MED ORDER — BACITRACIN ZINC 500 UNIT/GM EX OINT
TOPICAL_OINTMENT | Freq: Once | CUTANEOUS | Status: AC
  Administered 2013-12-01: 1 via TOPICAL

## 2013-12-01 NOTE — Discharge Instructions (Signed)
Abrasion °An abrasion is a cut or scrape of the skin. Abrasions do not extend through all layers of the skin and most heal within 10 days. It is important to care for your abrasion properly to prevent infection. °CAUSES  °Most abrasions are caused by falling on, or gliding across, the ground or other surface. When your skin rubs on something, the outer and inner layer of skin rubs off, causing an abrasion. °DIAGNOSIS  °Your caregiver will be able to diagnose an abrasion during a physical exam.  °TREATMENT  °Your treatment depends on how large and deep the abrasion is. Generally, your abrasion will be cleaned with water and a mild soap to remove any dirt or debris. An antibiotic ointment may be put over the abrasion to prevent an infection. A bandage (dressing) may be wrapped around the abrasion to keep it from getting dirty.  °You may need a tetanus shot if: °· You cannot remember when you had your last tetanus shot. °· You have never had a tetanus shot. °· The injury broke your skin. °If you get a tetanus shot, your arm may swell, get red, and feel warm to the touch. This is common and not a problem. If you need a tetanus shot and you choose not to have one, there is a rare chance of getting tetanus. Sickness from tetanus can be serious.  °HOME CARE INSTRUCTIONS  °· If a dressing was applied, change it at least once a day or as directed by your caregiver. If the bandage sticks, soak it off with warm water.   °· Wash the area with water and a mild soap to remove all the ointment 2 times a day. Rinse off the soap and pat the area dry with a clean towel.   °· Reapply any ointment as directed by your caregiver. This will help prevent infection and keep the bandage from sticking. Use gauze over the wound and under the dressing to help keep the bandage from sticking.   °· Change your dressing right away if it becomes wet or dirty.   °· Only take over-the-counter or prescription medicines for pain, discomfort, or fever as  directed by your caregiver.   °· Follow up with your caregiver within 24-48 hours for a wound check, or as directed. If you were not given a wound-check appointment, look closely at your abrasion for redness, swelling, or pus. These are signs of infection. °SEEK IMMEDIATE MEDICAL CARE IF:  °· You have increasing pain in the wound.   °· You have redness, swelling, or tenderness around the wound.   °· You have pus coming from the wound.   °· You have a fever or persistent symptoms for more than 2-3 days. °· You have a fever and your symptoms suddenly get worse. °· You have a bad smell coming from the wound or dressing.   °MAKE SURE YOU:  °· Understand these instructions. °· Will watch your condition. °· Will get help right away if you are not doing well or get worse. °Document Released: 02/02/2005 Document Revised: 04/11/2012 Document Reviewed: 03/29/2011 °ExitCare® Patient Information ©2015 [employer]. This information is not intended to replace advice given to you by your health care provider. Make sure you discuss any questions you have with your health care provider. ° ° ° ° ° °Fall Prevention and Home Safety °Falls cause injuries and can affect all age groups. It is possible to use preventive measures to significantly decrease the likelihood of falls. There are many simple measures which can make your home safer and prevent   falls. °OUTDOORS °· Repair cracks and edges of walkways and driveways. °· Remove high doorway thresholds. °· Trim shrubbery on the main path into your home. °· Have good outside lighting. °· Clear walkways of tools, rocks, debris, and clutter. °· Check that handrails are not broken and are securely fastened. Both sides of steps should have handrails. °· Have leaves, snow, and ice cleared regularly. °· Use sand or salt on walkways during winter months. °· In the garage, clean up grease or oil spills. °BATHROOM °· Install night lights. °· Install grab bars by the toilet and in the tub and  shower. °· Use non-skid mats or decals in the tub or shower. °· Place a plastic non-slip stool in the shower to sit on, if needed. °· Keep floors dry and clean up all water on the floor immediately. °· Remove soap buildup in the tub or shower on a regular basis. °· Secure bath mats with non-slip, double-sided rug tape. °· Remove throw rugs and tripping hazards from the floors. °BEDROOMS °· Install night lights. °· Make sure a bedside light is easy to reach. °· Do not use oversized bedding. °· Keep a telephone by your bedside. °· Have a firm chair with side arms to use for getting dressed. °· Remove throw rugs and tripping hazards from the floor. °KITCHEN °· Keep handles on pots and pans turned toward the center of the stove. Use back burners when possible. °· Clean up spills quickly and allow time for drying. °· Avoid walking on wet floors. °· Avoid hot utensils and knives. °· Position shelves so they are not too high or low. °· Place commonly used objects within easy reach. °· If necessary, use a sturdy step stool with a grab bar when reaching. °· Keep electrical cables out of the way. °· Do not use floor polish or wax that makes floors slippery. If you must use wax, use non-skid floor wax. °· Remove throw rugs and tripping hazards from the floor. °STAIRWAYS °· Never leave objects on stairs. °· Place handrails on both sides of stairways and use them. Fix any loose handrails. Make sure handrails on both sides of the stairways are as long as the stairs. °· Check carpeting to make sure it is firmly attached along stairs. Make repairs to worn or loose carpet promptly. °· Avoid placing throw rugs at the top or bottom of stairways, or properly secure the rug with carpet tape to prevent slippage. Get rid of throw rugs, if possible. °· Have an electrician put in a light switch at the top and bottom of the stairs. °OTHER FALL PREVENTION TIPS °· Wear low-heel or rubber-soled shoes that are supportive and fit well. Wear  closed toe shoes. °· When using a stepladder, make sure it is fully opened and both spreaders are firmly locked. Do not climb a closed stepladder. °· Add color or contrast paint or tape to grab bars and handrails in your home. Place contrasting color strips on first and last steps. °· Learn and use mobility aids as needed. Install an electrical emergency response system. °· Turn on lights to avoid dark areas. Replace light bulbs that burn out immediately. Get light switches that glow. °· Arrange furniture to create clear pathways. Keep furniture in the same place. °· Firmly attach carpet with non-skid or double-sided tape. °· Eliminate uneven floor surfaces. °· Select a carpet pattern that does not visually hide the edge of steps. °· Be aware of all pets. °OTHER HOME SAFETY TIPS °· Set   the water temperature for 120° F (48.8° C). °· Keep emergency numbers on or near the telephone. °· Keep smoke detectors on every level of the home and near sleeping areas. °Document Released: 04/15/2002 Document Revised: 10/25/2011 Document Reviewed: 07/15/2011 °ExitCare® Patient Information ©2015 [employer]. This information is not intended to replace advice given to you by your health care provider. Make sure you discuss any questions you have with your health care provider. ° ° ° °Head Injury °You have received a head injury. It does not appear serious at this time. Headaches and vomiting are common following head injury. It should be easy to awaken from sleeping. Sometimes it is necessary for you to stay in the emergency department for a while for observation. Sometimes admission to the hospital may be needed. After injuries such as yours, most problems occur within the first 24 hours, but side effects may occur up to 7-10 days after the injury. It is important for you to carefully monitor your condition and contact your health care provider or seek immediate medical care if there is a change in your condition. °WHAT ARE THE  TYPES OF HEAD INJURIES? °Head injuries can be as minor as a bump. Some head injuries can be more severe. More severe head injuries include: °· A jarring injury to the brain (concussion). °· A bruise of the brain (contusion). This mean there is bleeding in the brain that can cause swelling. °· A cracked skull (skull fracture). °· Bleeding in the brain that collects, clots, and forms a bump (hematoma). °WHAT CAUSES A HEAD INJURY? °A serious head injury is most likely to happen to someone who is in a car wreck and is not wearing a seat belt. Other causes of major head injuries include bicycle or motorcycle accidents, sports injuries, and falls. °HOW ARE HEAD INJURIES DIAGNOSED? °A complete history of the event leading to the injury and your current symptoms will be helpful in diagnosing head injuries. Many times, pictures of the brain, such as CT or MRI are needed to see the extent of the injury. Often, an overnight hospital stay is necessary for observation.  °WHEN SHOULD I SEEK IMMEDIATE MEDICAL CARE?  °You should get help right away if: °· You have confusion or drowsiness. °· You feel sick to your stomach (nauseous) or have continued, forceful vomiting. °· You have dizziness or unsteadiness that is getting worse. °· You have severe, continued headaches not relieved by medicine. Only take over-the-counter or prescription medicines for pain, fever, or discomfort as directed by your health care provider. °· You do not have normal function of the arms or legs or are unable to walk. °· You notice changes in the black spots in the center of the colored part of your eye (pupil). °· You have a clear or bloody fluid coming from your nose or ears. °· You have a loss of vision. °During the next 24 hours after the injury, you must stay with someone who can watch you for the warning signs. This person should contact local emergency services (911 in the [place] if you have seizures, you become unconscious, or you are unable to wake  up. °HOW CAN I PREVENT A HEAD INJURY IN THE FUTURE? °The most important factor for preventing major head injuries is avoiding motor vehicle accidents.  To minimize the potential for damage to your head, it is crucial to wear seat belts while riding in motor vehicles. Wearing helmets while bike riding and playing collision sports (like football) is also helpful. Also,   avoiding dangerous activities around the house will further help reduce your risk of head injury.  °WHEN CAN I RETURN TO NORMAL ACTIVITIES AND ATHLETICS? °You should be reevaluated by your health care provider before returning to these activities. If you have any of the following symptoms, you should not return to activities or contact sports until 1 week after the symptoms have stopped: °· Persistent headache. °· Dizziness or vertigo. °· Poor attention and concentration. °· Confusion. °· Memory problems. °· Nausea or vomiting. °· Fatigue or tire easily. °· Irritability. °· Intolerant of bright lights or loud noises. °· Anxiety or depression. °· Disturbed sleep. °MAKE SURE YOU:  °· Understand these instructions. °· Will watch your condition. °· Will get help right away if you are not doing well or get worse. °Document Released: 04/25/2005 Document Revised: 04/30/2013 Document Reviewed: 12/31/2012 °ExitCare® Patient Information ©2015 [employer]. This information is not intended to replace advice given to you by your health care provider. Make sure you discuss any questions you have with your health care provider. ° °

## 2013-12-01 NOTE — ED Notes (Signed)
Patient in bed  snoring and waiting for transport from [facility]

## 2013-12-01 NOTE — ED Provider Notes (Signed)
CSN: [id number]     Arrival date & time 12/01/13  0310 History   First MD Initiated Contact with Patient 12/01/13 [id number]     Chief Complaint  Patient presents with  . Fall     (Consider location/radiation/quality/duration/timing/severity/associated sxs/prior Treatment) HPI  This is a 48 year old who presents from a [facility] following a fall. He's been seen here for multiple falls in the last 3-4 days. Patient reports that he fell asleep again in his wheelchair and fell forward hitting his face. He is not on any anticoagulants. Currently he is complaining of headache and persistent pelvic and groin pain which is unchanged from his prior visits. He denies any injury to his pelvis or groin. Patient states "I have sleep apnea and that makes the fall out of a chair." He denies any chest pain, shortness of breath, or abdominal pain.  Past Medical History  Diagnosis Date  . Acute respiratory failure   . Hypoxemia   . Encephalopathy, unspecified   . Pain in limb   . Hyperosmolality and/or hypernatremia   . Dehydration   . Altered mental status   . Other specific developmental learning difficulties   . Acidosis   . Gallstone ileus   . Unspecified vitamin B deficiency   . Tobacco use disorder   . Acute conjunctivitis, unspecified   . Other chronic allergic conjunctivitis   . Unspecified essential hypertension   . Cellulitis and abscess of unspecified site   . Muscle weakness (generalized)   . Cognitive communication deficit   . COPD (chronic obstructive pulmonary disease)   . Sleep apnea    Past Surgical History  Procedure Laterality Date  . Eye surgery      cataracts   Family History  Problem Relation Age of Onset  . Lung cancer Father     deceased  . Diabetes Mother    History  Substance Use Topics  . Smoking status: Current Every Day Smoker  . Smokeless tobacco: Not on file  . Alcohol Use: No    Review of Systems  Constitutional: Negative.   Eyes: Negative  for visual disturbance.  Respiratory: Negative.  Negative for chest tightness and shortness of breath.   Cardiovascular: Negative.  Negative for chest pain.  Gastrointestinal: Negative.  Negative for abdominal pain.  Genitourinary: Negative.  Negative for dysuria.       Groin pain  Musculoskeletal: Negative for back pain.       Right hip pain  Skin: Negative for rash.  Neurological: Positive for headaches. Negative for dizziness, weakness, light-headedness and numbness.  All other systems reviewed and are negative.     Allergies  Review of patient's allergies indicates no known allergies.  Home Medications   Prior to Admission medications   Medication Sig Start Date End Date Taking? Authorizing Provider  albuterol (PROVENTIL) (5 MG/ML) 0.5% nebulizer solution Take 0.5 mLs (2.5 mg total) by nebulization every 6 (six) hours as needed for wheezing. 09/24/12   Tora KindredMarianne L York, PA-C  budesonide (PULMICORT) 0.25 MG/2ML nebulizer solution Take 2 mLs (0.25 mg total) by nebulization 2 (two) times daily. 09/24/12   Tora KindredMarianne L York, PA-C  budesonide (PULMICORT) 180 MCG/ACT inhaler Inhale into the lungs 2 (two) times daily.    Historical Provider, MD  cyclobenzaprine (FLEXERIL) 10 MG tablet Take 1 tablet (10 mg total) by mouth 2 (two) times daily as needed for muscle spasms. 11/28/13   Vida RollerBrian D Miller, MD  ibuprofen (ADVIL,MOTRIN) 800 MG tablet Take 800 mg by  mouth every 8 (eight) hours as needed.    Historical Provider, MD   BP 134/88  Pulse 92  Temp(Src) 97.9 F (36.6 C)  Resp 20  Wt 200 lb (90.719 kg) Physical Exam  Nursing note and vitals reviewed. Constitutional: He is oriented to person, place, and time. No distress.  Chronically ill-appearing, no acute distress  HENT:  Quarter-sized abrasion noted over the left forehead, superficial over the bridge of the nose, no other hematomas or lacerations noted, midface stable  Eyes: EOM are normal. Pupils are equal, round, and reactive to light.   Neck: Normal range of motion. Neck supple.  The midline C-spine tenderness  Cardiovascular: Normal rate, regular rhythm and normal heart sounds.   No murmur heard. Pulmonary/Chest: Effort normal and breath sounds normal. No respiratory distress. He has no wheezes.  Abdominal: Soft. There is no tenderness.  Musculoskeletal:  Contractured lower extremities in a flexed position at the hips and the knees, no obvious deformity  Lymphadenopathy:    He has no cervical adenopathy.  Neurological: He is alert and oriented to person, place, and time.  Skin: Skin is warm and dry.  Psychiatric: He has a normal mood and affect.    ED Course  Procedures (including critical care time) Labs Review Labs Reviewed - No data to display  Imaging Review Dg Pelvis 1-2 Views  11/30/2013   CLINICAL DATA:  Pelvic pain.  EXAM: PELVIS - 1-2 VIEW  COMPARISON:  None.  FINDINGS: Please note that the patient was very combative and refused to comply with the examination. A limited view of the pelvis was obtained.  No acute bony abnormalities are noted.  Degenerative changes in the lower lumbar spine a present.  Please note that the inferior portion the pelvis left hip are off the field of view.  IMPRESSION: Limited evaluation secondary to patient's inability to comply with examination. No acute bony abnormalities noted.   Electronically Signed   By: Laveda Abbe M.D.   On: 11/30/2013 14:49   Dg Femur Left  11/30/2013   CLINICAL DATA:  Fall, combative, confusion, groin pain  EXAM: LEFT FEMUR - 2 VIEW  COMPARISON:  11/29/2013  FINDINGS: Limited exam because of positioning and body habitus. Proximal aspect of the left femur and hip region are obscured by soft tissues and overlapping shadows. No gross displaced fracture evident. Degenerative changes noted of the left knee joint. No definite soft tissue abnormality.  IMPRESSION: Very limited exam because of positioning.  Left knee tricompartmental osteoarthritis.   Electronically  Signed   By: Ruel Favors M.D.   On: 11/30/2013 14:48   Ct Head Wo Contrast  11/30/2013   CLINICAL DATA:  Pain post trauma ; altered mental status  EXAM: CT HEAD WITHOUT CONTRAST  TECHNIQUE: Contiguous axial images were obtained from the base of the skull through the vertex without intravenous contrast.  COMPARISON:  August 08, 2013  FINDINGS: The ventricles are normal in size and configuration. There is a rather minimal cavum septum pellucidum, an anatomic variant. There is no mass, hemorrhage, extra-axial fluid collection, or midline shift. Wallace Cullens and white compartments appear within normal limits. No apparent acute infarct. There is a small left frontal scalp hematoma. Bony calvarium appears intact. The mastoid air cells are clear. There is debris in the external auditory canal on the left.  IMPRESSION: No intracranial mass, hemorrhage, or extra-axial fluid. Gray-white compartments appear normal. Probable cerumen in the left external auditory canal.   Electronically Signed   By: Chrissie Noa  Margarita Grizzle M.D.   On: 11/30/2013 14:34     EKG Interpretation None      MDM   Final diagnoses:  Abrasion of face, initial encounter  Fall, subsequent encounter    Patient presents with recurrent falls. He once again states he fell asleep in his wheelchair. This time he sustained facial trauma. He reports persistent but denies any new injury. Evidence of abrasions to the face. No C-spine tenderness noted. Cervical spine was cleared by Nexus criteria. Patient was given Percocet and CT scan of the head was obtained. Lab work from earlier today was reviewed and are reassuring. He has had multiple x-rays and a CT scan of the pelvis in the last several days for his hip and groin pain.  He was noted to have hematuria earlier today and will followup for urinalysis as an outpatient. Indication for further lab work at this time. CT scan is negative. Discussed findings with patient. He is to followup with Dr. Romeo Apple as  previously recommended regarding persistent left hip pain.  I also recommended he followup with his primary care physician and possibly be set up for a sleep study given that his falls occur when he falls asleep his wheelchair. I encouraged patient that if he feels sleepy, he should attempt to rest in his bed.  After history, exam, and medical workup I feel the patient has been appropriately medically screened and is safe for discharge home. Pertinent diagnoses were discussed with the patient. Patient was given return precautions.     Shon Baton, MD 12/01/13 [phone]

## 2013-12-01 NOTE — ED Notes (Signed)
Pt from [facility] facility. Pt has been here multiple times for falls. Pt has multiple bruises and abrasions. C/o cramping in his legs, abdominal pain, and groin pain.

## 2013-12-02 ENCOUNTER — Encounter (HOSPITAL_COMMUNITY): Payer: Self-pay | Admitting: Emergency Medicine

## 2013-12-02 ENCOUNTER — Emergency Department (HOSPITAL_COMMUNITY): Payer: Medicaid Other

## 2013-12-02 ENCOUNTER — Inpatient Hospital Stay (HOSPITAL_COMMUNITY)
Admission: EM | Admit: 2013-12-02 | Discharge: 2013-12-11 | DRG: 558 | Disposition: A | Discharge status: Skilled Nursing Facility | Payer: Medicaid Other | Attending: Internal Medicine | Admitting: Internal Medicine

## 2013-12-02 ENCOUNTER — Inpatient Hospital Stay (HOSPITAL_COMMUNITY): Payer: Medicaid Other

## 2013-12-02 DIAGNOSIS — E870 Hyperosmolality and hypernatremia: Secondary | ICD-10-CM

## 2013-12-02 DIAGNOSIS — Z9119 Patient's noncompliance with other medical treatment and regimen: Secondary | ICD-10-CM | POA: Diagnosis not present

## 2013-12-02 DIAGNOSIS — G4730 Sleep apnea, unspecified: Secondary | ICD-10-CM

## 2013-12-02 DIAGNOSIS — M4712 Other spondylosis with myelopathy, cervical region: Secondary | ICD-10-CM | POA: Diagnosis present

## 2013-12-02 DIAGNOSIS — M479 Spondylosis, unspecified: Secondary | ICD-10-CM | POA: Diagnosis present

## 2013-12-02 DIAGNOSIS — T40605A Adverse effect of unspecified narcotics, initial encounter: Secondary | ICD-10-CM

## 2013-12-02 DIAGNOSIS — J449 Chronic obstructive pulmonary disease, unspecified: Secondary | ICD-10-CM | POA: Diagnosis present

## 2013-12-02 DIAGNOSIS — Z9181 History of falling: Secondary | ICD-10-CM | POA: Diagnosis not present

## 2013-12-02 DIAGNOSIS — T148XXA Other injury of unspecified body region, initial encounter: Secondary | ICD-10-CM | POA: Diagnosis present

## 2013-12-02 DIAGNOSIS — D539 Nutritional anemia, unspecified: Secondary | ICD-10-CM | POA: Diagnosis present

## 2013-12-02 DIAGNOSIS — M6282 Rhabdomyolysis: Secondary | ICD-10-CM | POA: Diagnosis present

## 2013-12-02 DIAGNOSIS — E872 Acidosis: Secondary | ICD-10-CM

## 2013-12-02 DIAGNOSIS — M47817 Spondylosis without myelopathy or radiculopathy, lumbosacral region: Secondary | ICD-10-CM | POA: Diagnosis present

## 2013-12-02 DIAGNOSIS — F17200 Nicotine dependence, unspecified, uncomplicated: Secondary | ICD-10-CM | POA: Diagnosis present

## 2013-12-02 DIAGNOSIS — W19XXXA Unspecified fall, initial encounter: Secondary | ICD-10-CM

## 2013-12-02 DIAGNOSIS — R296 Repeated falls: Secondary | ICD-10-CM

## 2013-12-02 DIAGNOSIS — M4310 Spondylolisthesis, site unspecified: Secondary | ICD-10-CM

## 2013-12-02 DIAGNOSIS — M79604 Pain in right leg: Secondary | ICD-10-CM | POA: Diagnosis present

## 2013-12-02 DIAGNOSIS — M47812 Spondylosis without myelopathy or radiculopathy, cervical region: Secondary | ICD-10-CM | POA: Diagnosis present

## 2013-12-02 DIAGNOSIS — G4733 Obstructive sleep apnea (adult) (pediatric): Secondary | ICD-10-CM | POA: Diagnosis present

## 2013-12-02 DIAGNOSIS — J9601 Acute respiratory failure with hypoxia: Secondary | ICD-10-CM

## 2013-12-02 DIAGNOSIS — K8020 Calculus of gallbladder without cholecystitis without obstruction: Secondary | ICD-10-CM

## 2013-12-02 DIAGNOSIS — S22009A Unspecified fracture of unspecified thoracic vertebra, initial encounter for closed fracture: Secondary | ICD-10-CM | POA: Insufficient documentation

## 2013-12-02 DIAGNOSIS — Z720 Tobacco use: Secondary | ICD-10-CM

## 2013-12-02 DIAGNOSIS — R0689 Other abnormalities of breathing: Secondary | ICD-10-CM

## 2013-12-02 DIAGNOSIS — Z91199 Patient's noncompliance with other medical treatment and regimen due to unspecified reason: Secondary | ICD-10-CM | POA: Diagnosis not present

## 2013-12-02 DIAGNOSIS — J9602 Acute respiratory failure with hypercapnia: Secondary | ICD-10-CM

## 2013-12-02 DIAGNOSIS — Z833 Family history of diabetes mellitus: Secondary | ICD-10-CM | POA: Diagnosis not present

## 2013-12-02 DIAGNOSIS — T796XXA Traumatic ischemia of muscle, initial encounter: Secondary | ICD-10-CM

## 2013-12-02 DIAGNOSIS — Q762 Congenital spondylolisthesis: Secondary | ICD-10-CM | POA: Diagnosis not present

## 2013-12-02 DIAGNOSIS — Z801 Family history of malignant neoplasm of trachea, bronchus and lung: Secondary | ICD-10-CM | POA: Diagnosis not present

## 2013-12-02 DIAGNOSIS — F70 Mild intellectual disabilities: Secondary | ICD-10-CM | POA: Diagnosis present

## 2013-12-02 DIAGNOSIS — E860 Dehydration: Secondary | ICD-10-CM | POA: Diagnosis present

## 2013-12-02 DIAGNOSIS — F89 Unspecified disorder of psychological development: Secondary | ICD-10-CM

## 2013-12-02 DIAGNOSIS — L03116 Cellulitis of left lower limb: Secondary | ICD-10-CM

## 2013-12-02 DIAGNOSIS — R4182 Altered mental status, unspecified: Secondary | ICD-10-CM

## 2013-12-02 DIAGNOSIS — M79605 Pain in left leg: Secondary | ICD-10-CM

## 2013-12-02 DIAGNOSIS — M79609 Pain in unspecified limb: Secondary | ICD-10-CM

## 2013-12-02 DIAGNOSIS — Z808 Family history of malignant neoplasm of other organs or systems: Secondary | ICD-10-CM | POA: Diagnosis not present

## 2013-12-02 DIAGNOSIS — G9340 Encephalopathy, unspecified: Secondary | ICD-10-CM

## 2013-12-02 HISTORY — DX: Other spondylosis with myelopathy, cervical region: M47.12

## 2013-12-02 HISTORY — DX: Cellulitis of left lower limb: L03.116

## 2013-12-02 HISTORY — DX: Acute respiratory failure with hypoxia: J96.01

## 2013-12-02 HISTORY — DX: Spondylosis without myelopathy or radiculopathy, lumbosacral region: M47.817

## 2013-12-02 HISTORY — DX: Spondylosis, unspecified: M47.9

## 2013-12-02 HISTORY — DX: Obstructive sleep apnea (adult) (pediatric): G47.33

## 2013-12-02 HISTORY — DX: Acidosis: E87.2

## 2013-12-02 HISTORY — DX: Unspecified disorder of psychological development: F89

## 2013-12-02 LAB — CBC WITH DIFFERENTIAL/PLATELET
BASOS ABS: 0 10*3/uL (ref 0.0–0.1)
Basophils Relative: 0 % (ref 0–1)
Eosinophils Absolute: 0.1 10*3/uL (ref 0.0–0.7)
Eosinophils Relative: 1 % (ref 0–5)
HCT: 37 % — ABNORMAL LOW (ref 39.0–52.0)
Hemoglobin: 11.7 g/dL — ABNORMAL LOW (ref 13.0–17.0)
Lymphocytes Relative: 19 % (ref 12–46)
Lymphs Abs: 2.1 10*3/uL (ref 0.7–4.0)
MCH: 31.6 pg (ref 26.0–34.0)
MCHC: 31.6 g/dL (ref 30.0–36.0)
MCV: 100 fL (ref 78.0–100.0)
Monocytes Absolute: 1 10*3/uL (ref 0.1–1.0)
Monocytes Relative: 9 % (ref 3–12)
Neutro Abs: 7.9 10*3/uL — ABNORMAL HIGH (ref 1.7–7.7)
Neutrophils Relative %: 71 % (ref 43–77)
PLATELETS: 237 10*3/uL (ref 150–400)
RBC: 3.7 MIL/uL — ABNORMAL LOW (ref 4.22–5.81)
RDW: 14.9 % (ref 11.5–15.5)
WBC: 11.1 10*3/uL — ABNORMAL HIGH (ref 4.0–10.5)

## 2013-12-02 LAB — BASIC METABOLIC PANEL
Anion gap: 10 (ref 5–15)
BUN: 23 mg/dL (ref 6–23)
CALCIUM: 8.9 mg/dL (ref 8.4–10.5)
CO2: 26 mEq/L (ref 19–32)
CREATININE: 1.35 mg/dL (ref 0.50–1.35)
Chloride: 114 mEq/L — ABNORMAL HIGH (ref 96–112)
GFR calc Af Amer: 71 mL/min — ABNORMAL LOW (ref >90)
GFR, EST NON AFRICAN AMERICAN: 61 mL/min — AB (ref >90)
Glucose, Bld: 92 mg/dL (ref 70–99)
Potassium: 4.4 mEq/L (ref 3.7–5.3)
Sodium: 150 mEq/L — ABNORMAL HIGH (ref 137–147)

## 2013-12-02 LAB — CK: Total CK: 2997 U/L — ABNORMAL HIGH (ref 7–232)

## 2013-12-02 MED ORDER — BUDESONIDE 0.25 MG/2ML IN SUSP
0.2500 mg | Freq: Two times a day (BID) | RESPIRATORY_TRACT | Status: DC
  Administered 2013-12-02 – 2013-12-11 (×12): 0.25 mg via RESPIRATORY_TRACT
  Filled 2013-12-02 (×15): qty 2

## 2013-12-02 MED ORDER — FLUTICASONE PROPIONATE HFA 44 MCG/ACT IN AERO: 2.0000 | INHALATION_SPRAY | Freq: Two times a day (BID) | RESPIRATORY_TRACT | Status: DC

## 2013-12-02 MED ORDER — MORPHINE SULFATE 4 MG/ML IJ SOLN: 2.0000 mg | INTRAMUSCULAR | Status: DC | PRN

## 2013-12-02 MED ORDER — ALBUTEROL SULFATE (2.5 MG/3ML) 0.083% IN NEBU: 2.5000 mg | INHALATION_SOLUTION | RESPIRATORY_TRACT | Status: DC | PRN

## 2013-12-02 MED ORDER — SENNA 8.6 MG PO TABS
1.0000 | ORAL_TABLET | Freq: Every day | ORAL | Status: DC
  Administered 2013-12-02 – 2013-12-09 (×8): 8.6 mg via ORAL
  Filled 2013-12-02 (×7): qty 1

## 2013-12-02 MED ORDER — ACETAMINOPHEN 650 MG RE SUPP: 650.0000 mg | Freq: Four times a day (QID) | RECTAL | Status: DC | PRN

## 2013-12-02 MED ORDER — HYDROCODONE-ACETAMINOPHEN 5-325 MG PO TABS: 1.0000 | ORAL_TABLET | ORAL | Status: AC | PRN

## 2013-12-02 MED ORDER — GUAIFENESIN-DM 100-10 MG/5ML PO SYRP: 5.0000 mL | ORAL_SOLUTION | ORAL | Status: DC | PRN

## 2013-12-02 MED ORDER — SODIUM CHLORIDE 0.9 % IV SOLN: INTRAVENOUS | Status: DC

## 2013-12-02 MED ORDER — HEPARIN SODIUM (PORCINE) 5000 UNIT/ML IJ SOLN
5000.0000 [IU] | Freq: Three times a day (TID) | INTRAMUSCULAR | Status: DC
  Administered 2013-12-02 – 2013-12-03 (×3): 5000 [IU] via SUBCUTANEOUS
  Filled 2013-12-02 (×3): qty 1

## 2013-12-02 MED ORDER — ONDANSETRON HCL 4 MG/2ML IJ SOLN: 4.0000 mg | Freq: Three times a day (TID) | INTRAMUSCULAR | Status: DC | PRN

## 2013-12-02 MED ORDER — HYDROCODONE-ACETAMINOPHEN 5-325 MG PO TABS
1.0000 | ORAL_TABLET | ORAL | Status: DC | PRN
  Administered 2013-12-03: 1 via ORAL
  Administered 2013-12-04 – 2013-12-09 (×6): 2 via ORAL
  Filled 2013-12-02 (×3): qty 2
  Filled 2013-12-02: qty 1
  Filled 2013-12-02 (×3): qty 2

## 2013-12-02 MED ORDER — ONDANSETRON HCL 4 MG/2ML IJ SOLN: 4.0000 mg | Freq: Four times a day (QID) | INTRAMUSCULAR | Status: DC | PRN

## 2013-12-02 MED ORDER — ONDANSETRON HCL 4 MG PO TABS: 4.0000 mg | ORAL_TABLET | Freq: Four times a day (QID) | ORAL | Status: DC | PRN

## 2013-12-02 MED ORDER — ACETAMINOPHEN 325 MG PO TABS: 650.0000 mg | ORAL_TABLET | Freq: Four times a day (QID) | ORAL | Status: DC | PRN

## 2013-12-02 MED ORDER — ALUM & MAG HYDROXIDE-SIMETH 200-200-20 MG/5ML PO SUSP: 30.0000 mL | Freq: Four times a day (QID) | ORAL | Status: DC | PRN

## 2013-12-02 MED ORDER — POTASSIUM CHLORIDE IN NACL 20-0.45 MEQ/L-% IV SOLN
INTRAVENOUS | Status: DC
  Administered 2013-12-02 – 2013-12-03 (×3): via INTRAVENOUS
  Filled 2013-12-02 (×9): qty 1000

## 2013-12-02 MED ORDER — HYDROMORPHONE HCL PF 1 MG/ML IJ SOLN
0.5000 mg | Freq: Once | INTRAMUSCULAR | Status: AC
  Administered 2013-12-02: 0.5 mg via INTRAVENOUS
  Filled 2013-12-02: qty 1

## 2013-12-02 MED ORDER — SODIUM CHLORIDE 0.9 % IV BOLUS (SEPSIS)
1000.0000 mL | Freq: Once | INTRAVENOUS | Status: AC
  Administered 2013-12-02: 1000 mL via INTRAVENOUS

## 2013-12-02 MED ORDER — MORPHINE SULFATE 2 MG/ML IJ SOLN
1.0000 mg | INTRAMUSCULAR | Status: DC | PRN
  Administered 2013-12-02 – 2013-12-04 (×5): 1 mg via INTRAVENOUS
  Filled 2013-12-02 (×5): qty 1

## 2013-12-02 MED ORDER — HYDROCODONE-ACETAMINOPHEN 5-325 MG PO TABS
1.0000 | ORAL_TABLET | Freq: Once | ORAL | Status: AC
  Administered 2013-12-02: 1 via ORAL
  Filled 2013-12-02: qty 1

## 2013-12-02 NOTE — ED Notes (Signed)
Spoke with Lupita Leashonna, pt's caretaker at [facility], to get a better understanding of pt's baseline. Lupita LeashDonna stated that pt was able to "walk with a walker with no problems." Lupita LeashDonna stated that "after initial fall from bed last Thursday for Friday, pt has been unable to walk and has fallen attempting to stand, or has just fallen out of wheelchair." Lupita LeashDonna reports that to her knowledge pt has had no changes in mentation. Informed Raynelle FanningJulie, PA of this conversation.

## 2013-12-02 NOTE — H&P (Signed)
[facility] History and Physical  [name] ZOX:[id number] DOB: 09-Jan-1966 DOA: 12/02/2013  Referring physician: ED, PA, [name] PCP: Default, Provider, MD   Chief Complaint: Frequent falls and bilateral leg pain.  HPI: [name] is a 48 y.o. male with a history of developmental disability, obstructive sleep apnea, COPD, and tobacco abuse. He also has a history of acute respiratory failure with hypoxia secondary to obstructive sleep apnea and opiate-induced altered mentation during the hospitalization in May of 2014. Prior to his recent ER visits, he had been ambulating with a walker with no difficulty per history. The patient initially presented to the emergency department on 11/28/2013 with a fall. Apparently, at that time, he rolled out of bed and fell on the floor. He complained of cramping in his left thigh. X-rays at that time revealed no acute fractures. He was given Flexeril and then discharged back to [facility] assisted living. Apparently, he was instructed to ambulate via a wheelchair. He presented again on 7/25 and 7/26 for recurrent falls from his wheelchair. More x-rays were ordered and revealed no acute fractures. CT of his head was negative. He returns again today for the same. He reports falling asleep in the wheelchair and falling on the floor. He does not recall passing out. He has no history of seizures. He denies any prior headache, chest pain, palpitations, shortness of breath, fever, chills, nausea, vomiting, or diarrhea. He complains of severe cramping pain in both of his legs mostly over his thighs. It is so painful that he cannot walk. He also cannot straighten out his legs without pain being excruciating. It radiates to his groin. He denies neck pain or lower back pain.  In the ED, he is mildly tachycardic, otherwise hemodynamically stable. He is afebrile. His lab data are significant for total CK of 2997, WBC of 11.1, hemoglobin 11.7, and sodium of 150.  X-ray of his lumbar spine reveals probable fractures in the lower thoracic and upper lumbar region, et cetera. He is being admitted for further evaluation and management.   Review of Systems:  His review of systems as above in history present illness. Otherwise, his review of systems is negative per his account.    Past Medical History  Diagnosis Date  . Acute respiratory failure   . Hypoxemia   . Encephalopathy, unspecified   . Pain in limb   . Hyperosmolality and/or hypernatremia   . Dehydration   . Altered mental status   . Other specific developmental learning difficulties   . Acidosis   . Gallstone ileus   . Unspecified vitamin B deficiency   . Tobacco use disorder   . Acute conjunctivitis, unspecified   . Other chronic allergic conjunctivitis   . Unspecified essential hypertension   . Cellulitis and abscess of unspecified site   . Muscle weakness (generalized)   . Cognitive communication deficit   . COPD (chronic obstructive pulmonary disease)   . Sleep apnea   . 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 Acute respiratory failure with hypoxia 09/20/2012  . Acute respiratory acidosis 09/20/2012  . Cellulitis of left lower extremity 09/20/2012  . OSA (obstructive sleep apnea) 09/20/2012   Past Surgical History  Procedure Laterality Date  . Eye surgery      cataracts   Social History: The patient is single. He has no children. He is a resident of [facility] assisted living. He says that he has been there for 4 weeks. He receives disability and SSI for being a "slow  learner". He smokes a pack of cigarettes per day. He denies alcohol and illicit drug use. Before the falls, he ambulated with a walker.    No Known Allergies  Family History  Problem Relation Age of Onset  . Lung cancer Father     deceased  . Diabetes Mother      Prior to Admission medications   Medication Sig Start Date End Date Taking? Authorizing Provider  albuterol (PROVENTIL) (5 MG/ML) 0.5% nebulizer  solution Take 0.5 mLs (2.5 mg total) by nebulization every 6 (six) hours as needed for wheezing. 09/24/12  Yes Marianne L York, PA-C  budesonide (PULMICORT) 0.25 MG/2ML nebulizer solution Take 2 mLs (0.25 mg total) by nebulization 2 (two) times daily. 09/24/12  Yes Marianne L York, PA-C  budesonide (PULMICORT) 180 MCG/ACT inhaler Inhale into the lungs 2 (two) times daily.   Yes Historical Provider, MD  cyclobenzaprine (FLEXERIL) 10 MG tablet Take 1 tablet (10 mg total) by mouth 2 (two) times daily as needed for muscle spasms. 11/28/13  Yes Vida RollerBrian D Miller, MD  ibuprofen (ADVIL,MOTRIN) 800 MG tablet Take 800 mg by mouth every 8 (eight) hours as needed.   Yes Historical Provider, MD  HYDROcodone-acetaminophen (NORCO/VICODIN) 5-325 MG per tablet Take 1 tablet by mouth every 4 (four) hours as needed for moderate pain. 12/02/13   Burgess AmorJulie Idol, PA-C   Physical Exam: Filed Vitals:   12/02/13 1200 12/02/13 1300 12/02/13 1330 12/02/13 1526  BP: 150/121 143/106 124/78 114/54  Pulse: 113 114 100 109  Temp:    98.7 F (37.1 C)  TempSrc:    Oral  Resp:    18  Weight:    86.773 kg (191 lb 4.8 oz)  SpO2: 96% 94% 95% 98%    Wt Readings from Last 3 Encounters:  12/02/13 86.773 kg (191 lb 4.8 oz)  12/01/13 90.719 kg (200 lb)  11/30/13 90.719 kg (200 lb)    General:  Debilitated-appearing 48 year old Caucasian man sitting up in bed, in no acute distress. Eyes: PERRL extraocular was are intact. Conjunctivae are clear. Sclerae are white. ENT: Oropharynx reveals very dry mucous membranes, mildly stained with tobacco. Very poor dentition. No posterior exudates or erythema. Nasal mucosa is dry. Tympanic membranes not examined. Neck: no LAD, masses or thyromegaly. No nuchal rigidity. No exquisite tenderness over the cervical spine or muscles. Cardiovascular: S1, S2, with mild tachycardia and a soft systolic murmur. Trace of pedal edema bilaterally. Telemetry: Not applicable  Respiratory: Clear anteriorly with  decreased breath sounds in the bases. Normal respiratory effort. Abdomen: Positive bowel sounds, mildly obese, soft, nontender, nondistended. Skin: Several superficial abrasions on his frontal scalp, 4 head, and bridge of nose. He also has some mild excoriations on his lower legs bilaterally. None of the abrasions or excoriations are draining or have significant erythema. Musculoskeletal: He has exquisite tenderness over his right greater than left proximal legs over his quadriceps bilaterally. He resists extending his legs because of pain. Query flexion contractures versus avoiding extension because of pain. When I attempt to extend his right leg, he screams out in pain pointing to his thigh and his groin. Appears to have some hypertrophic arthritic changes of both knees, but no acute hot joints. Palpation of his paracervical and paralumbar muscles without tenderness. Psychiatric: He has a flat affect. He is alert and oriented x3. He is cooperative with exception of the leg exam. Neurologic: He is alert and oriented x3. Cranial nerves II through XII are grossly intact. He is able to raise both  arms 90. He has a slightly weakened hand grip bilaterally. Leg exam difficult as the patient complains of pain.           Labs on Admission:  Basic Metabolic Panel:  Recent Labs Lab 11/30/13 1250 12/02/13 1234  NA 148* 150*  K 4.4 4.4  CL 112 114*  CO2 26 26  GLUCOSE 143* 92  BUN 24* 23  CREATININE 1.28 1.35  CALCIUM 9.2 8.9   Liver Function Tests:  Recent Labs Lab 11/30/13 1250  AST 39*  ALT 29  ALKPHOS 117  BILITOT <0.2*  PROT 7.2  ALBUMIN 3.5   No results found for this basename: LIPASE, AMYLASE,  in the last 168 hours No results found for this basename: AMMONIA,  in the last 168 hours CBC:  Recent Labs Lab 11/30/13 1250 12/02/13 1234  WBC 10.0 11.1*  NEUTROABS 6.9 7.9*  HGB 12.6* 11.7*  HCT 38.7* 37.0*  MCV 99.0 100.0  PLT 238 237   Cardiac Enzymes:  Recent Labs Lab  12/02/13 1234  CKTOTAL 2997*    BNP (last 3 results) No results found for this basename: PROBNP,  in the last 8760 hours CBG: No results found for this basename: GLUCAP,  in the last 168 hours  Radiological Exams on Admission: Dg Lumbar Spine 2-3 Views  12/02/2013   CLINICAL DATA:  Pain post trauma  EXAM: LUMBAR SPINE - 2-3 VIEW  COMPARISON:  Report of prior lumbar series June 18, 2011 available ; images from that study cannot be retrieved at this time.  FINDINGS: Frontal and lateral views were obtained. There are 5 non-rib-bearing lumbar type vertebral bodies. There is lumbar levoscoliosis. There is remodeling of the T11, T12, L1, and L2 vertebral bodies with what appears to be chronic wedging at these levels. No acute appearing fracture is seen. There is 1.7 cm of anterolisthesis of L5 on S1. No other spondylolisthesis is seen.  There is moderately severe disc space narrowing at all levels. No erosive change.  IMPRESSION: Probable fractures in the lower thoracic and upper lumbar region. 1.7 cm of anterolisthesis of L5 on S1. By report, there was 1.1 cm of anterolisthesis of L5 on S1 on the previous study. Suspect progression of arthropathy as the cause of the increase in spondylolisthesis, although traumatic etiology cannot be excluded. There is scoliosis as well as multilevel arthropathy.   Electronically Signed   By: Bretta Bang M.D.   On: 12/02/2013 09:47   Dg Hip Complete Left  12/02/2013   CLINICAL DATA:  Left hip pain status post fall  EXAM: LEFT HIP - COMPLETE 2+ VIEW  COMPARISON:  None.  FINDINGS: There is no obvious hip fracture or dislocation. There is no lytic or blastic osseous lesion. Examination is limited secondary to difficulty in patient positioning secondary to pain.  Degenerative changes of the lower lumbar spine.  IMPRESSION: 1. Limited exam secondary to suboptimal positioning. No evidence of acute fracture or dislocation. If there is persistent clinical concern, and  patient is unable to tolerate satisfactory positioning further evaluation with MRI may be helpful.   Electronically Signed   By: Elige Ko   On: 12/02/2013 09:45   Ct Head Wo Contrast  12/01/2013   CLINICAL DATA:  Headache and weakness. Status post fall out of bed. Skin tear at the mid forehead.  EXAM: CT HEAD WITHOUT CONTRAST  TECHNIQUE: Contiguous axial images were obtained from the base of the skull through the vertex without intravenous contrast.  COMPARISON:  CT of  the head performed 11/30/2013  FINDINGS: There is no evidence of acute infarction, mass lesion, or intra- or extra-axial hemorrhage on CT.  The posterior fossa, including the cerebellum, brainstem and fourth ventricle, is within normal limits. The third and lateral ventricles, and basal ganglia are unremarkable in appearance. The cerebral hemispheres are symmetric in appearance, with normal gray-white differentiation. No mass effect or midline shift is seen.  There is no evidence of fracture; visualized osseous structures are unremarkable in appearance. Bilateral proptosis is noted. A small mucus retention cyst or polyp is noted at the left maxillary sinus. The remaining paranasal sinuses and mastoid air cells are well-aerated. Mild soft tissue swelling is noted overlying the left frontal calvarium.  IMPRESSION: 1. No evidence of traumatic intracranial injury or fracture. 2. Mild soft tissue swelling overlying the left frontal calvarium. 3. Bilateral proptosis noted. 4. Small mucus retention cyst or polyp at the left maxillary sinus.   Electronically Signed   By: Roanna Raider M.D.   On: 12/01/2013 04:58    EKG: Pending.  Assessment/Plan Principal Problem:   Multiple falls Active Problems:   Bilateral leg pain   Closed fracture of thoracic vertebral body   Closed fracture of thoracic vertebra   Dehydration with hypernatremia   Rhabdomyolysis   OSA (obstructive sleep apnea)   Skin abrasion   COPD with chronic bronchitis    Tobacco abuse   1. Multiple falls with possible thoracic and lumbar fractures. Etiology is unclear. The patient attributes his falls to falling asleep in his wheelchair from obstructive sleep apnea. Prior to falling out of the wheelchair, apparently he fell out of bed. Per history, at baseline, the patient had been ambulating "hunched over". He has been seen multiple times in the ED over the past several days. Prior radiographic studies revealed no acute fractures, but the lumbar x-rays today revealed probable lower thoracic and upper lumbar fractures. Per chart review, I see no cervical spine x-rays or CT scan. For further evaluation, we will order an EEG, TSH, vitamin B12 level, and EKG. 2. Bilateral leg pain. I am concerned that the patient may have a spinal cord injury. The patient is unable to or refuses to extend his legs bilaterally. He has exquisite pain in his proximal legs when I attempted to extend them. This appears to be worse on the right. He does not complain of neck pain or lower back pain in particular. Of note, the patient had been ambulating with a walker just last week. The other possibility is that he could have a tendon tear or muscle rupture. The exam was difficult. MRI in the ED was attempted, but the patient could not extend his legs enough to get an MRI scanner. For this reason, CT of his cervical spine, the rectus spine, and lumbar spine will be ordered. We will consult neurologist Dr. Gerilyn Pilgrim for further management recommendations. We will treat his pain with as needed analgesics, but with caution as the patient is very sensitive to large doses of opiates. 3. Mild rhabdomyolysis, secondary to falls. I do not believe the rhabdomyolysis is attributing to his symptomatology. We will continue IV fluid hydration started in the ED. He 4. Dehydration with hypernatremia. The patient denies vomiting or diarrhea. We will start IV fluid hydration with half-normal saline. We'll continue to  monitor. 5. Obstructive sleep apnea. The patient was diagnosed last year. He says that he is supposed to be on CPAP, but apparently it has not been ordered. CPAP will be provided during this  hospitalization each bedtime. 6. COPD with tobacco abuse. Currently stable. We'll continue his chronic inhalers. The patient was strongly advised to stop smoking. He declines a nicotine patch. 7. Mild leukocytosis. This may be secondary to stress reaction. Urinalysis on 7/26 revealed no WBCs. He is afebrile. He has no signs of pneumonia on exam. 8. Mild anemia. This will be followed.    Code Status: Full code DVT Prophylaxis: Subcutaneous heparin with caution Family Communication: No family available Disposition Plan: To be determined.  Time spent: 1 hour and 10 minutes  [facility] Pager [phone]  **Disclaimer: This note may have been dictated with voice recognition software. Similar sounding words can inadvertently be transcribed and this note may contain transcription errors which may not have been corrected upon publication of note.**

## 2013-12-02 NOTE — ED Provider Notes (Signed)
CSN: [id number]     Arrival date & time 12/02/13  0755 History   First MD Initiated Contact with Patient 12/02/13 0801     Chief Complaint  Patient presents with  . Fall     (Consider location/radiation/quality/duration/timing/severity/associated sxs/prior Treatment) The history is provided by the patient and the nursing home.   [name] is a 48 y.o. male presenting from a [facility] following another fall.   He's been seen here for multiple falls in the last 3-4 days describing that he falls asleep in his wheelchair and tumbles out, this time landing on his left side causing pain in his left hip and lower back. He is not on any anticoagulants. He also describes  groin pain which is unchanged from his prior visits and states he has this for years and is not new or different today.  He denies any chest pain, shortness of breath, or abdominal pain.  Also denies head or neck pain or injury.     Past Medical History  Diagnosis Date  . Acute respiratory failure   . Hypoxemia   . Encephalopathy, unspecified   . Pain in limb   . Hyperosmolality and/or hypernatremia   . Dehydration   . Altered mental status   . Other specific developmental learning difficulties   . Acidosis   . Gallstone ileus   . Unspecified vitamin B deficiency   . Tobacco use disorder   . Acute conjunctivitis, unspecified   . Other chronic allergic conjunctivitis   . Unspecified essential hypertension   . Cellulitis and abscess of unspecified site   . Muscle weakness (generalized)   . Cognitive communication deficit   . COPD (chronic obstructive pulmonary disease)   . Sleep apnea   . 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 Acute respiratory failure with hypoxia 09/20/2012  . Acute respiratory acidosis 09/20/2012  . Cellulitis of left lower extremity 09/20/2012  . OSA (obstructive sleep apnea) 09/20/2012   Past Surgical History  Procedure Laterality Date  . Eye surgery      cataracts   Family  History  Problem Relation Age of Onset  . Lung cancer Father     deceased  . Diabetes Mother    History  Substance Use Topics  . Smoking status: Current Every Day Smoker  . Smokeless tobacco: Not on file  . Alcohol Use: No    Review of Systems  Constitutional: Negative for fever.  Respiratory: Negative for shortness of breath.   Cardiovascular: Negative for chest pain and leg swelling.  Gastrointestinal: Negative for abdominal pain, constipation and abdominal distention.  Genitourinary: Negative for dysuria, urgency, frequency, flank pain and difficulty urinating.  Musculoskeletal: Positive for arthralgias and back pain. Negative for joint swelling.  Skin: Negative for rash.  Neurological: Negative for weakness and numbness.      Allergies  Review of patient's allergies indicates no known allergies.  Home Medications   Prior to Admission medications   Medication Sig Start Date End Date Taking? Authorizing Provider  albuterol (PROVENTIL) (5 MG/ML) 0.5% nebulizer solution Take 0.5 mLs (2.5 mg total) by nebulization every 6 (six) hours as needed for wheezing. 09/24/12  Yes Marianne L York, PA-C  budesonide (PULMICORT) 0.25 MG/2ML nebulizer solution Take 2 mLs (0.25 mg total) by nebulization 2 (two) times daily. 09/24/12  Yes Marianne L York, PA-C  budesonide (PULMICORT) 180 MCG/ACT inhaler Inhale into the lungs 2 (two) times daily.   Yes Historical Provider, MD  cyclobenzaprine (FLEXERIL) 10 MG tablet Take  1 tablet (10 mg total) by mouth 2 (two) times daily as needed for muscle spasms. 11/28/13  Yes Vida Roller, MD  ibuprofen (ADVIL,MOTRIN) 800 MG tablet Take 800 mg by mouth every 8 (eight) hours as needed.   Yes Historical Provider, MD  HYDROcodone-acetaminophen (NORCO/VICODIN) 5-325 MG per tablet Take 1 tablet by mouth every 4 (four) hours as needed for moderate pain. 12/02/13   Burgess Amor, PA-C   BP 114/54  Pulse 109  Temp(Src) 98.7 F (37.1 C) (Oral)  Resp 18  Wt 191 lb  4.8 oz (86.773 kg)  SpO2 98% Physical Exam  Constitutional: He appears well-developed and well-nourished. No distress.  HENT:  Head: Atraumatic.  Old scabbing on forehead and nose from prior recent fall.   Eyes: EOM are normal. Pupils are equal, round, and reactive to light.  Neck: Normal range of motion.  nontender.  Cardiovascular:  Pulses:      Dorsalis pedis pulses are 2+ on the right side, and 2+ on the left side.  Pulses equal bilaterally  Pulmonary/Chest: Effort normal and breath sounds normal.  Abdominal: Soft. There is no tenderness.  Musculoskeletal: He exhibits tenderness.  ttp left lateral hip.  Patient has bilateral leg contractures,  Presents in fetal position lying on right side.  Unable to extend legs.   Neurological: He is alert. He has normal strength. He displays normal reflexes. No sensory deficit.  Skin: Skin is warm and dry.  Old trace of ecchymosis left lateral hip, no new visible trauma.  Psychiatric: He has a normal mood and affect.    ED Course  Procedures (including critical care time) Labs Review Labs Reviewed  BASIC METABOLIC PANEL - Abnormal; Notable for the following:    Sodium 150 (*)    Chloride 114 (*)    GFR calc non Af Amer 61 (*)    GFR calc Af Amer 71 (*)    All other components within normal limits  CK - Abnormal; Notable for the following:    Total CK 2997 (*)    All other components within normal limits  CBC WITH DIFFERENTIAL - Abnormal; Notable for the following:    WBC 11.1 (*)    RBC 3.70 (*)    Hemoglobin 11.7 (*)    HCT 37.0 (*)    Neutro Abs 7.9 (*)    All other components within normal limits  TSH  COMPREHENSIVE METABOLIC PANEL  CBC  VITAMIN B12    Imaging Review    EKG Interpretation None        Dg Lumbar Spine 2-3 Views  12/02/2013   CLINICAL DATA:  Pain post trauma  EXAM: LUMBAR SPINE - 2-3 VIEW  COMPARISON:  Report of prior lumbar series June 18, 2011 available ; images from that study cannot be  retrieved at this time.  FINDINGS: Frontal and lateral views were obtained. There are 5 non-rib-bearing lumbar type vertebral bodies. There is lumbar levoscoliosis. There is remodeling of the T11, T12, L1, and L2 vertebral bodies with what appears to be chronic wedging at these levels. No acute appearing fracture is seen. There is 1.7 cm of anterolisthesis of L5 on S1. No other spondylolisthesis is seen.  There is moderately severe disc space narrowing at all levels. No erosive change.  IMPRESSION: Probable fractures in the lower thoracic and upper lumbar region. 1.7 cm of anterolisthesis of L5 on S1. By report, there was 1.1 cm of anterolisthesis of L5 on S1 on the previous study. Suspect progression of arthropathy  as the cause of the increase in spondylolisthesis, although traumatic etiology cannot be excluded. There is scoliosis as well as multilevel arthropathy.   Electronically Signed   By: Bretta BangWilliam  Woodruff M.D.   On: 12/02/2013 09:47   Hip left  12/02/2013   CLINICAL DATA:  Left hip pain status post fall  EXAM: LEFT HIP - COMPLETE 2+ VIEW  COMPARISON:  None.  FINDINGS: There is no obvious hip fracture or dislocation. There is no lytic or blastic osseous lesion. Examination is limited secondary to difficulty in patient positioning secondary to pain.  Degenerative changes of the lower lumbar spine.  IMPRESSION: 1. Limited exam secondary to suboptimal positioning. No evidence of acute fracture or dislocation. If there is persistent clinical concern, and patient is unable to tolerate satisfactory positioning further evaluation with MRI may be helpful.   Electronically Signed   By: Elige KoHetal  Patel   On: 12/02/2013 09:45       MDM   Final diagnoses:  Fall, initial encounter  Anterolisthesis  Dehydration  Traumatic rhabdomyolysis, initial encounter    Patients labs and/or radiological studies were viewed and considered during the medical decision making and disposition process. Pt with frequent falls  from his wheelchair,  Reporting he falls asleep and tumbles forward.  He is in skilled nursing center.  4th visit here for same complaint.   Pt with severe lower extremity contractures who is wheelchair/bedbound.  Discussed with patient need figure out way to avoid further falls.  Call placed to assisted living facility by ed RN who spoke with patients caretaker, who revealed that this is a patient that one week ago was ambulatory with a walker and not using a wheelchair.  Since his initial fall on the 23rd,  He has been unable to stand and continues to fall from the wheelchair he has been using.  He has had no changes in mental status.  With this new information, planned MRI of lumbar spine given significant worsened anterolisthesis along with vertebral body changes.  Pt unable to position for mri.  Pt unable to straighten legs even after given dilaudid 0.5 mg during which pt endorsed resolution of pain.  He had repeated seemingly involuntary flexion at the hips and knees with both active and passive attempts at lower extremity extension.  Labs obtained revealing rhabdomyolysis, dehydration without renal failure.  Call placed for admission. Dr. Sherrie MustacheFisher will accept patient, temp admit orders completed.     Burgess AmorJulie Jaylah Goodlow, PA-C 12/02/13 1034  Burgess AmorJulie Zoye Chandra, PA-C 12/02/13 1729  Burgess AmorJulie Jassiel Flye, PA-C 12/02/13 1729

## 2013-12-02 NOTE — ED Notes (Signed)
[facility] - per EMS, called out for fall from wheelchair, un witnessed.  Denies hitting head/loc.  C/o left groin pain, seen here recently for same.

## 2013-12-02 NOTE — ED Notes (Signed)
Kathlene NovemberMike from MRI brought pt back and said with legs constricted, he was unable to fit into MRI machine. This nurse will speak to EDP about issue.

## 2013-12-02 NOTE — ED Notes (Signed)
Pt placed on 2L of O2 after O2 sats dropped to 85 with dilaudid. EDP notified.

## 2013-12-02 NOTE — ED Notes (Signed)
Admitting MD at bedside.

## 2013-12-02 NOTE — Discharge Instructions (Signed)
Fall Prevention and Home Safety Falls cause injuries and can affect all age groups. It is possible to prevent falls.  HOW TO PREVENT FALLS  Wear shoes with rubber soles that do not have an opening for your toes.  Keep the inside and outside of your house well lit.  Use night lights throughout your home.  Remove clutter from floors.  Clean up floor spills.  Remove throw rugs or fasten them to the floor with carpet tape.  Do not place electrical cords across pathways.  Put grab bars by your tub, shower, and toilet. Do not use towel bars as grab bars.  Put handrails on both sides of the stairway. Fix loose handrails.  Do not climb on stools or stepladders, if possible.  Do not wax your floors.  Repair uneven or unsafe sidewalks, walkways, or stairs.  Keep items you use a lot within reach.  Be aware of pets.  Keep emergency numbers next to the telephone.  Put smoke detectors in your home and near bedrooms. Ask your doctor what other things you can do to prevent falls. Document Released: 02/19/2009 Document Revised: 10/25/2011 Document Reviewed: 07/26/2011 [facility] Patient Information 2015 [employer], [place]. This information is not intended to replace advice given to you by your health care provider. Make sure you discuss any questions you have with your health care provider.    You may take the medication as prescribed if needed for pain.  Be aware that this medicine can cause drowsiness.  You need to discuss with your doctor ways to prevent future falls.  You may need a different wheelchair that would prevent you from falling out of it if you fall asleep.

## 2013-12-02 NOTE — ED Notes (Signed)
Pt unable to straighten legs and remain supine for MRI, PA aware and at bedside.  When PA attempted to straighten legs, pt straighten briefly, but not completely, then flex back to initial position, pt reports is not intentionally keeping legs drawn to body.

## 2013-12-02 NOTE — ED Notes (Signed)
Pt presents with old abrasions; pt reports abrasions not caused by current reason for visit. Pt has old 2 cm scabbed abrasion on central forehead and small scabbed scrape on bridge of nose. Pt has old bruise from previous fall on right hip. Bruise and abrasions noted by EDP.

## 2013-12-02 NOTE — Consult Note (Signed)
Joshua Graham A. Merlene Laughter, MD     [email or web address]          [name] is an 48 y.o. male.   ASSESSMENT/PLAN:  1. Subacute gait disorder of unclear etiology. However, the exam suggests cervical myelopathy with brisk reflexes and upgoing toes. Therefore, a cervical spine MRI will be obtained. Seemed to have a lot of pain which is likely due to spasms. Baclofen or other anxiolytic/benzodiazepines are suggested. Use pain medication when necessary.Patient does have an elevated CPK which is likely due to rhabdomyolysis as opposed to a myopathy. CPK will be repeated and additional lab testing including thyroid function test, vitamin B-12 level, ANA and sedimentation rate. 2. Hypersomnia suspected due to sleep apnea possibly medication effect. The patient has been placed on positive pressure. EEG is also suggested which I think has been ordered already. 3. 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 He apparently has stated some type of nursing facility or group home most of his adult life. The patient apparently has had multiple falls over last several days. It appears he also has dosed off and fell asleep a couple times sustaining injuries to the head. The patient does have a baseline history of obstructive sleep apnea syndrome and reports that he has been using his machine. The patient complains of severe pain and what appears to be spasms of the legs. The patient tells me that he usually walks around with a cane at baseline but has not walked in the past week. The patient does not report having focal neurological symptoms. He denies numbness or tingling. He did report having some numbness upper extremities recently although not to the past few days. He denies any headaches, dysarthria, dysphagia, chest pain or shortness of breath. Review  of systems is otherwise negative. The workup thus far has been mostly unrevealing other than for a significantly elevated CPK of 2000.   GENERAL: Pleasant man who seems to be in significant pain involving the legs.  HEENT: Supple. There are significant bruises noted that the nasal bridge and for head.  ABDOMEN: soft  EXTREMITIES: No edema   BACK: Normal.  SKIN: Normal by inspection.    MENTAL STATUS: Alert and oriented To hospital month and year. He also is oriented to his overall medical condition.  Speech, language and cognition are generally Unremarkable. Judgment and insight Fair.   CRANIAL NERVES: Pupils are equal, round and reactive to light and accommodation; extra ocular movements are full, there is no significant nystagmus; visual fields are full; upper and lower facial muscles are normal in strength and symmetric, there is no flattening of the nasolabial folds; tongue is midline; uvula is midline; shoulder elevation is normal.  MOTOR: Normal tone, bulk and strength- In the upper extremities; no pronator drift. He has at least antigravity strength in the legs but there appears to be contracture at the knees which is new per the patient. He is complaining of severe pain in the legs. There appears to be increased tone in the legs.  COORDINATION: Left finger to nose is normal, right finger to nose is normal, No rest tremor; no intention tremor; no postural tremor; no bradykinesia.  REFLEXES: Deep tendon reflexes are symmetrical But pathologically brisk throughout including the upper extremities. Babinski reflexes are Extensor bilaterally.   SENSATION: Normal to light touch.  Past Medical History  Diagnosis Date  . Acute respiratory failure   . Hypoxemia   . Encephalopathy, unspecified   . Pain in limb   . Hyperosmolality and/or hypernatremia   . Dehydration   . Altered mental status   . Other specific developmental learning difficulties   . Acidosis   .  Gallstone ileus   . Unspecified vitamin B deficiency   . Tobacco use disorder   . Acute conjunctivitis, unspecified   . Other chronic allergic conjunctivitis   . Unspecified essential hypertension   . Cellulitis and abscess of unspecified site   . Muscle weakness (generalized)   . Cognitive communication deficit   . COPD (chronic obstructive pulmonary disease)   . Sleep apnea   . 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 Acute respiratory failure with hypoxia 09/20/2012  . Acute respiratory acidosis 09/20/2012  . Cellulitis of left lower extremity 09/20/2012  . OSA (obstructive sleep apnea) 09/20/2012    Past Surgical History  Procedure Laterality Date  . Eye surgery      cataracts    Family History  Problem Relation Age of Onset  . Lung cancer Father     deceased  . Diabetes Mother     Social History:  reports that he has been smoking.  He does not have any smokeless tobacco history on file. He reports that he does not drink alcohol or use illicit drugs.  Allergies: No Known Allergies  Medications: Prior to Admission medications   Medication Sig Start Date End Date Taking? Authorizing Provider  albuterol (PROVENTIL) (5 MG/ML) 0.5% nebulizer solution Take 0.5 mLs (2.5 mg total) by nebulization every 6 (six) hours as needed for wheezing. 09/24/12  Yes Marianne L York, PA-C  budesonide (PULMICORT) 0.25 MG/2ML nebulizer solution Take 2 mLs (0.25 mg total) by nebulization 2 (two) times daily. 09/24/12  Yes Marianne L York, PA-C  budesonide (PULMICORT) 180 MCG/ACT inhaler Inhale into the lungs 2 (two) times daily.   Yes Historical Provider, MD  cyclobenzaprine (FLEXERIL) 10 MG tablet Take 1 tablet (10 mg total) by mouth 2 (two) times daily as needed for muscle spasms. 11/28/13  Yes Johnna Acosta, MD  ibuprofen (ADVIL,MOTRIN) 800 MG tablet Take 800 mg by mouth every 8 (eight) hours as needed.   Yes Historical Provider, MD  HYDROcodone-acetaminophen (NORCO/VICODIN) 5-325 MG per tablet Take  1 tablet by mouth every 4 (four) hours as needed for moderate pain. 12/02/13   Evalee Jefferson, PA-C    Scheduled Meds: . budesonide  0.25 mg Nebulization BID  . heparin  5,000 Units Subcutaneous 3 times per day  . senna  1 tablet Oral QHS   Continuous Infusions: . 0.45 % NaCl with KCl 20 mEq / L 125 mL/hr at 12/02/13 1646   PRN Meds:.acetaminophen, acetaminophen, albuterol, alum & mag hydroxide-simeth, guaiFENesin-dextromethorphan, HYDROcodone-acetaminophen, morphine injection, ondansetron (ZOFRAN) IV, ondansetron   Blood pressure 114/54, pulse 109, temperature 98.7 F (37.1 C), temperature source Oral, resp. rate 18, weight 86.773 kg (191 lb 4.8 oz), SpO2 98.00%.   Results for orders placed during the hospital encounter of 12/02/13 (from the past 48 hour(s))  BASIC METABOLIC PANEL     Status: Abnormal   Collection Time    12/02/13 12:34 PM      Result Value Ref Range   Sodium 150 (*) 137 - 147 mEq/L   Potassium 4.4  3.7 - 5.3 mEq/L   Chloride 114 (*) 96 - 112 mEq/L   CO2 26  19 - 32 mEq/L  Glucose, Bld 92  70 - 99 mg/dL   BUN 23  6 - 23 mg/dL   Creatinine, Ser 1.35  0.50 - 1.35 mg/dL   Calcium 8.9  8.4 - 10.5 mg/dL   GFR calc non Af Amer 61 (*) >90 mL/min   GFR calc Af Amer 71 (*) >90 mL/min   Comment: (NOTE)     The eGFR has been calculated using the CKD EPI equation.     This calculation has not been validated in all clinical situations.     eGFR's persistently <90 mL/min signify possible Chronic Kidney     Disease.   Anion gap 10  5 - 15  CK     Status: Abnormal   Collection Time    12/02/13 12:34 PM      Result Value Ref Range   Total CK 2997 (*) 7 - 232 U/L  CBC WITH DIFFERENTIAL     Status: Abnormal   Collection Time    12/02/13 12:34 PM      Result Value Ref Range   WBC 11.1 (*) 4.0 - 10.5 K/uL   RBC 3.70 (*) 4.22 - 5.81 MIL/uL   Hemoglobin 11.7 (*) 13.0 - 17.0 g/dL   HCT 37.0 (*) 39.0 - 52.0 %   MCV 100.0  78.0 - 100.0 fL   MCH 31.6  26.0 - 34.0 pg   MCHC  31.6  30.0 - 36.0 g/dL   RDW 14.9  11.5 - 15.5 %   Platelets 237  150 - 400 K/uL   Neutrophils Relative % 71  43 - 77 %   Neutro Abs 7.9 (*) 1.7 - 7.7 K/uL   Lymphocytes Relative 19  12 - 46 %   Lymphs Abs 2.1  0.7 - 4.0 K/uL   Monocytes Relative 9  3 - 12 %   Monocytes Absolute 1.0  0.1 - 1.0 K/uL   Eosinophils Relative 1  0 - 5 %   Eosinophils Absolute 0.1  0.0 - 0.7 K/uL   Basophils Relative 0  0 - 1 %   Basophils Absolute 0.0  0.0 - 0.1 K/uL    Dg Lumbar Spine 2-3 Views  12/02/2013   CLINICAL DATA:  Pain post trauma  EXAM: LUMBAR SPINE - 2-3 VIEW  COMPARISON:  Report of prior lumbar series June 18, 2011 available ; images from that study cannot be retrieved at this time.  FINDINGS: Frontal and lateral views were obtained. There are 5 non-rib-bearing lumbar type vertebral bodies. There is lumbar levoscoliosis. There is remodeling of the T11, T12, L1, and L2 vertebral bodies with what appears to be chronic wedging at these levels. No acute appearing fracture is seen. There is 1.7 cm of anterolisthesis of L5 on S1. No other spondylolisthesis is seen.  There is moderately severe disc space narrowing at all levels. No erosive change.  IMPRESSION: Probable fractures in the lower thoracic and upper lumbar region. 1.7 cm of anterolisthesis of L5 on S1. By report, there was 1.1 cm of anterolisthesis of L5 on S1 on the previous study. Suspect progression of arthropathy as the cause of the increase in spondylolisthesis, although traumatic etiology cannot be excluded. There is scoliosis as well as multilevel arthropathy.   Electronically Signed   By: Lowella Grip M.D.   On: 12/02/2013 09:47   Dg Hip Complete Left  12/02/2013   CLINICAL DATA:  Left hip pain status post fall  EXAM: LEFT HIP - COMPLETE 2+ VIEW  COMPARISON:  None.  FINDINGS:  There is no obvious hip fracture or dislocation. There is no lytic or blastic osseous lesion. Examination is limited secondary to difficulty in patient  positioning secondary to pain.  Degenerative changes of the lower lumbar spine.  IMPRESSION: 1. Limited exam secondary to suboptimal positioning. No evidence of acute fracture or dislocation. If there is persistent clinical concern, and patient is unable to tolerate satisfactory positioning further evaluation with MRI may be helpful.   Electronically Signed   By: Kathreen Devoid   On: 12/02/2013 09:45   Ct Head Wo Contrast  12/01/2013   CLINICAL DATA:  Headache and weakness. Status post fall out of bed. Skin tear at the mid forehead.  EXAM: CT HEAD WITHOUT CONTRAST  TECHNIQUE: Contiguous axial images were obtained from the base of the skull through the vertex without intravenous contrast.  COMPARISON:  CT of the head performed 11/30/2013  FINDINGS: There is no evidence of acute infarction, mass lesion, or intra- or extra-axial hemorrhage on CT.  The posterior fossa, including the cerebellum, brainstem and fourth ventricle, is within normal limits. The third and lateral ventricles, and basal ganglia are unremarkable in appearance. The cerebral hemispheres are symmetric in appearance, with normal gray-white differentiation. No mass effect or midline shift is seen.  There is no evidence of fracture; visualized osseous structures are unremarkable in appearance. Bilateral proptosis is noted. A small mucus retention cyst or polyp is noted at the left maxillary sinus. The remaining paranasal sinuses and mastoid air cells are well-aerated. Mild soft tissue swelling is noted overlying the left frontal calvarium.  IMPRESSION: 1. No evidence of traumatic intracranial injury or fracture. 2. Mild soft tissue swelling overlying the left frontal calvarium. 3. Bilateral proptosis noted. 4. Small mucus retention cyst or polyp at the left maxillary sinus.   Electronically Signed   By: Garald Balding M.D.   On: 12/01/2013 04:58   Ct Cervical Spine Wo Contrast  12/02/2013   CLINICAL DATA:  Fall.  Evaluate for injury.  EXAM: CT  CERVICAL, THORACIC, AND LUMBAR SPINE WITHOUT CONTRAST  TECHNIQUE: Multidetector CT imaging of the cervical, thoracic and lumbar spine was performed without intravenous contrast. Multiplanar CT image reconstructions were also generated.  COMPARISON:  CT cervical spine 08/08/2013 and CT chest 09/19/2012. CT pelvis 11/29/2013.  FINDINGS: CT CERVICAL SPINE FINDINGS  There is straightening of the normal cervical lordosis. Slight flattening of the C5 superior endplate is new from 83/72/9021. Vertebral body height is otherwise maintained and unchanged. Advanced degenerative disc disease at C3-4 with marked loss of disc space height. Neural foramina appear widely patent. Visualized portions of the intracranial contents show no acute findings. Soft tissues are unremarkable.  CT THORACIC SPINE FINDINGS  Alignment is anatomic, including at the cervicothoracic junction. Vertebral body height is maintained. Scattered endplate degenerative changes. Visualized portions of the ribs show no fracture. Visualized portions of the lungs shows scattered subsegmental atelectasis or scarring. No pleural fluid.  CT LUMBAR SPINE FINDINGS  Straightening of the normal lumbar lordosis. Approximately 12 mm anterolisthesis of L5 on S1 with severe degenerative disc disease and obliteration of the joint space, as on 11/29/2013. There are chronic bilateral L5 pars defects. Vertebral body height and alignment are otherwise maintained. Endplate degenerative changes and prominent Schmorl's nodes are seen of the thoracolumbar junction as well as at L1-2 and L2-3.  Visualized portions of the kidneys and retroperitoneum show no acute findings. Bladder is distended.  IMPRESSION: 1. C5 superior endplate appears slightly compressed when compared with 08/08/2013. Otherwise, no evidence  of an acute fracture or subluxation. 2. Multilevel spondylosis. 3. Grade 2 anterolisthesis of L5 on S1, secondary to chronic bilateral pars defects, with advanced secondary  degenerative disc disease.   Electronically Signed   By: Lorin Picket M.D.   On: 12/02/2013 17:46   Ct Thoracic Spine Wo Contrast  12/02/2013   CLINICAL DATA:  Fall.  Evaluate for injury.  EXAM: CT CERVICAL, THORACIC, AND LUMBAR SPINE WITHOUT CONTRAST  TECHNIQUE: Multidetector CT imaging of the cervical, thoracic and lumbar spine was performed without intravenous contrast. Multiplanar CT image reconstructions were also generated.  COMPARISON:  CT cervical spine 08/08/2013 and CT chest 09/19/2012. CT pelvis 11/29/2013.  FINDINGS: CT CERVICAL SPINE FINDINGS  There is straightening of the normal cervical lordosis. Slight flattening of the C5 superior endplate is new from 32/54/9826. Vertebral body height is otherwise maintained and unchanged. Advanced degenerative disc disease at C3-4 with marked loss of disc space height. Neural foramina appear widely patent. Visualized portions of the intracranial contents show no acute findings. Soft tissues are unremarkable.  CT THORACIC SPINE FINDINGS  Alignment is anatomic, including at the cervicothoracic junction. Vertebral body height is maintained. Scattered endplate degenerative changes. Visualized portions of the ribs show no fracture. Visualized portions of the lungs shows scattered subsegmental atelectasis or scarring. No pleural fluid.  CT LUMBAR SPINE FINDINGS  Straightening of the normal lumbar lordosis. Approximately 12 mm anterolisthesis of L5 on S1 with severe degenerative disc disease and obliteration of the joint space, as on 11/29/2013. There are chronic bilateral L5 pars defects. Vertebral body height and alignment are otherwise maintained. Endplate degenerative changes and prominent Schmorl's nodes are seen of the thoracolumbar junction as well as at L1-2 and L2-3.  Visualized portions of the kidneys and retroperitoneum show no acute findings. Bladder is distended.  IMPRESSION: 1. C5 superior endplate appears slightly compressed when compared with  08/08/2013. Otherwise, no evidence of an acute fracture or subluxation. 2. Multilevel spondylosis. 3. Grade 2 anterolisthesis of L5 on S1, secondary to chronic bilateral pars defects, with advanced secondary degenerative disc disease.   Electronically Signed   By: Lorin Picket M.D.   On: 12/02/2013 17:46   Ct Lumbar Spine Wo Contrast  12/02/2013   CLINICAL DATA:  Fall.  Evaluate for injury.  EXAM: CT CERVICAL, THORACIC, AND LUMBAR SPINE WITHOUT CONTRAST  TECHNIQUE: Multidetector CT imaging of the cervical, thoracic and lumbar spine was performed without intravenous contrast. Multiplanar CT image reconstructions were also generated.  COMPARISON:  CT cervical spine 08/08/2013 and CT chest 09/19/2012. CT pelvis 11/29/2013.  FINDINGS: CT CERVICAL SPINE FINDINGS  There is straightening of the normal cervical lordosis. Slight flattening of the C5 superior endplate is new from 41/58/3094. Vertebral body height is otherwise maintained and unchanged. Advanced degenerative disc disease at C3-4 with marked loss of disc space height. Neural foramina appear widely patent. Visualized portions of the intracranial contents show no acute findings. Soft tissues are unremarkable.  CT THORACIC SPINE FINDINGS  Alignment is anatomic, including at the cervicothoracic junction. Vertebral body height is maintained. Scattered endplate degenerative changes. Visualized portions of the ribs show no fracture. Visualized portions of the lungs shows scattered subsegmental atelectasis or scarring. No pleural fluid.  CT LUMBAR SPINE FINDINGS  Straightening of the normal lumbar lordosis. Approximately 12 mm anterolisthesis of L5 on S1 with severe degenerative disc disease and obliteration of the joint space, as on 11/29/2013. There are chronic bilateral L5 pars defects. Vertebral body height and alignment are otherwise maintained. Endplate degenerative changes  and prominent Schmorl's nodes are seen of the thoracolumbar junction as well as at  L1-2 and L2-3.  Visualized portions of the kidneys and retroperitoneum show no acute findings. Bladder is distended.  IMPRESSION: 1. C5 superior endplate appears slightly compressed when compared with 08/08/2013. Otherwise, no evidence of an acute fracture or subluxation. 2. Multilevel spondylosis. 3. Grade 2 anterolisthesis of L5 on S1, secondary to chronic bilateral pars defects, with advanced secondary degenerative disc disease.   Electronically Signed   By: Lorin Picket M.D.   On: 12/02/2013 17:46        [name], M.D.  Diplomate, Tax adviser of Psychiatry and Neurology ( Neurology). 12/02/2013, 7:37 PM

## 2013-12-03 ENCOUNTER — Encounter (HOSPITAL_COMMUNITY): Payer: Self-pay | Admitting: Internal Medicine

## 2013-12-03 ENCOUNTER — Other Ambulatory Visit (HOSPITAL_COMMUNITY): Payer: Self-pay

## 2013-12-03 ENCOUNTER — Inpatient Hospital Stay (HOSPITAL_COMMUNITY): Payer: Medicaid Other

## 2013-12-03 DIAGNOSIS — Z9181 History of falling: Secondary | ICD-10-CM

## 2013-12-03 DIAGNOSIS — M47812 Spondylosis without myelopathy or radiculopathy, cervical region: Secondary | ICD-10-CM | POA: Diagnosis present

## 2013-12-03 DIAGNOSIS — M47817 Spondylosis without myelopathy or radiculopathy, lumbosacral region: Secondary | ICD-10-CM

## 2013-12-03 DIAGNOSIS — M4712 Other spondylosis with myelopathy, cervical region: Secondary | ICD-10-CM

## 2013-12-03 DIAGNOSIS — M479 Spondylosis, unspecified: Secondary | ICD-10-CM

## 2013-12-03 HISTORY — DX: Other spondylosis with myelopathy, cervical region: M47.12

## 2013-12-03 HISTORY — DX: Spondylosis without myelopathy or radiculopathy, lumbosacral region: M47.817

## 2013-12-03 HISTORY — DX: Spondylosis, unspecified: M47.9

## 2013-12-03 LAB — COMPREHENSIVE METABOLIC PANEL
ALBUMIN: 3.1 g/dL — AB (ref 3.5–5.2)
ALK PHOS: 106 U/L (ref 39–117)
ALT: 41 U/L (ref 0–53)
ANION GAP: 10 (ref 5–15)
AST: 66 U/L — AB (ref 0–37)
BILIRUBIN TOTAL: 0.3 mg/dL (ref 0.3–1.2)
BUN: 19 mg/dL (ref 6–23)
CHLORIDE: 116 meq/L — AB (ref 96–112)
CO2: 24 mEq/L (ref 19–32)
Calcium: 8.8 mg/dL (ref 8.4–10.5)
Creatinine, Ser: 1.21 mg/dL (ref 0.50–1.35)
GFR calc Af Amer: 81 mL/min — ABNORMAL LOW (ref >90)
GFR calc non Af Amer: 70 mL/min — ABNORMAL LOW (ref >90)
Glucose, Bld: 72 mg/dL (ref 70–99)
POTASSIUM: 4.7 meq/L (ref 3.7–5.3)
SODIUM: 150 meq/L — AB (ref 137–147)
Total Protein: 6.4 g/dL (ref 6.0–8.3)

## 2013-12-03 LAB — CBC
HEMATOCRIT: 37 % — AB (ref 39.0–52.0)
Hemoglobin: 11.8 g/dL — ABNORMAL LOW (ref 13.0–17.0)
MCH: 32.4 pg (ref 26.0–34.0)
MCHC: 31.9 g/dL (ref 30.0–36.0)
MCV: 101.6 fL — ABNORMAL HIGH (ref 78.0–100.0)
Platelets: 217 10*3/uL (ref 150–400)
RBC: 3.64 MIL/uL — ABNORMAL LOW (ref 4.22–5.81)
RDW: 14.9 % (ref 11.5–15.5)
WBC: 9.4 10*3/uL (ref 4.0–10.5)

## 2013-12-03 LAB — HOMOCYSTEINE: HOMOCYSTEINE-NORM: 11.7 umol/L (ref 4.0–15.4)

## 2013-12-03 LAB — VITAMIN B12: Vitamin B-12: 327 pg/mL (ref 211–911)

## 2013-12-03 LAB — CK TOTAL AND CKMB (NOT AT ARMC)
CK TOTAL: 12 U/L (ref 7–232)
CK, MB: <1 ng/mL (ref 0.3–4.0)

## 2013-12-03 LAB — SEDIMENTATION RATE: SED RATE: 37 mm/h — AB (ref 0–16)

## 2013-12-03 LAB — TSH: TSH: 2.12 u[IU]/mL (ref 0.350–4.500)

## 2013-12-03 MED ORDER — DIAZEPAM 5 MG PO TABS
10.0000 mg | ORAL_TABLET | Freq: Two times a day (BID) | ORAL | Status: DC
  Administered 2013-12-03 – 2013-12-04 (×3): 10 mg via ORAL
  Filled 2013-12-03 (×3): qty 2

## 2013-12-03 NOTE — Clinical Social Work Placement (Signed)
Clinical Social Work Department CLINICAL SOCIAL WORK PLACEMENT NOTE 12/03/2013  Patient:  [name],[name]  Account Number:  [id number] Admit date:  12/02/2013  Clinical Social Worker:  Derenda FennelKARA Malik Ruffino, LCSW  Date/time:  12/03/2013 03:25 PM  Clinical Social Work is seeking post-discharge placement for this patient at the following level of care:   SKILLED NURSING   (*CSW will update this form in Epic as items are completed)   12/03/2013  Patient/family provided with [facility] Department of Clinical Social Work's list of facilities offering this level of care within the geographic area requested by the patient (or if unable, by the patient's family).  12/03/2013  Patient/family informed of their freedom to choose among providers that offer the needed level of care, that participate in Medicare, Medicaid or managed care program needed by the patient, have an available bed and are willing to accept the patient.  12/03/2013  Patient/family informed of [facility]' ownership interest in [facility], as well as of the fact that they are under no obligation to receive care at this facility.  PASARR submitted to EDS on 12/03/2013 PASARR number received on   FL2 transmitted to all facilities in geographic area requested by pt/family on  12/03/2013 FL2 transmitted to all facilities within larger geographic area on 12/03/2013  Patient informed that his/her managed care company has contracts with or will negotiate with  certain facilities, including the following:     Patient/family informed of bed offers received:   Patient chooses bed at  Physician recommends and patient chooses bed at    Patient to be transferred to  on   Patient to be transferred to facility by  Patient and family notified of transfer on  Name of family member notified:    The following physician request were entered in Epic:   Additional Comments:  Derenda FennelKara Zaide Mcclenahan, LCSW [phone]

## 2013-12-03 NOTE — Care Management Note (Signed)
    Page 1 of 1   12/05/2013     3:36:50 PM CARE MANAGEMENT NOTE 12/05/2013  Patient:  [name],[name]   Account Number:  [id number]  Date Initiated:  12/03/2013  Documentation initiated by:  Anibal HendersonBOLDEN,GENEVA  Subjective/Objective Assessment:   Admitted with Rabdomyolysis, and possible fx spine. [facility] CSW following     Action/Plan:   Anticipated DC Date:  12/05/2013   Anticipated DC Plan:  ASSISTED LIVING / REST HOME  In-house referral  Clinical Social Worker      DC Planning Services  CM consult      Choice offered to / List presented to:             Status of service:  In process, will continue to follow Medicare Important Message given?   (If response is "NO", the following Medicare IM given date fields will be blank) Date Medicare IM given:   Medicare IM given by:   Date Additional Medicare IM given:   Additional Medicare IM given by:    Discharge Disposition:  ACUTE TO ACUTE TRANS  Per UR Regulation:  Reviewed for med. necessity/level of care/duration of stay  If discussed at Long Length of Stay Meetings, dates discussed:    Comments:  12/05/13 1525 Arlyss Queenammy Nikolina Simerson, RN BSN CM [facility] CM on receiving unit to follow for discharge planning.  12/03/13 Anibal HendersonGeneva Bolden, RN/CM

## 2013-12-03 NOTE — Progress Notes (Signed)
Patient refused test today said he could not lay on back today for test to be completed, patient said to come back tomorrow. Tech will try again tomorrow

## 2013-12-03 NOTE — Care Management Utilization Note (Signed)
UR completed 

## 2013-12-03 NOTE — Progress Notes (Signed)
12/03/13 1945 Late entry for 1600. Patient assisted up to chair with maximove lift and 3 nursing staff assist. Tolerated sitting up well, nurse tech at bedside while pt up to chair d/t high fall risk and recent falls from wheelchair at ALF reported per night shift RN. Chair alarm remained in place as well. Call light kept within reach. Pt assisted back to bed per nurse techs assist. bedalarm remained on for safety. Earnstine RegalAshley Elridge Stemm, RN

## 2013-12-03 NOTE — Clinical Social Work Psychosocial (Signed)
Clinical Social Work Department BRIEF PSYCHOSOCIAL ASSESSMENT 12/03/2013  Patient:  [name], [name]     Account Number:  [id number]     Admit date:  12/02/2013  Clinical Social Worker:  Wyatt Haste  Date/Time:  12/03/2013 03:40 PM  Referred by:  CSW  Date Referred:  12/03/2013 Referred for  SNF Placement   Other Referral:   Interview type:  Patient Other interview type:   sister- [name]    PSYCHOSOCIAL DATA Living Status:  FACILITY Admitted from facility:  [facility] Level of care:  Assisted Living Primary support name:  [name] Primary support relationship to patient:  [place] Degree of support available:   supportive    CURRENT CONCERNS Current Concerns  Post-Acute Placement   Other Concerns:    SOCIAL WORK ASSESSMENT / PLAN CSW met with pt at bedside. Pt alert and oriented to self and place. Reports he has fallen a lot recently. Pt unable to remember name of facility he came from. CSW informed him of PT recommendation for SNF. He requests CSW to call his sister, [name] and he wishes to defer all decisions to her. CSW spoke with [name]. She states that pt has been a resident at [facility] for the past month. DSS became involved when pt was living alone in an apartment and facilitated his placement there. [name] is pt's only family member and she visits pt when able. Pt lived with his mother all his life until she died. 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 He is not receiving services. [name] is very concerned about pt as he has had several falls recently and was unable to ambulate. Pt generally ambulates with a walker. He had also rolled out of bed at [facility]. Discussed therapy recommendation for SNF. Pt went to [facility] last summer for rehab and then returned home. She is open to placement in [place] or [place] counties.    CSW attempted to call [facility], but staff able to make decisions were not in  the building. Receptionist was to give message to call CSW, but no return call yet.   Assessment/plan status:  Psychosocial Support/Ongoing Assessment of Needs Other assessment/ plan:   Information/referral to community resources:   SNF list    PATIENT'S/FAMILY'S RESPONSE TO PLAN OF CARE: Pt defers decisions to his sister. CSW will initiate bed search and follow up with offers when available.       [name], [place]

## 2013-12-03 NOTE — Evaluation (Signed)
Occupational Therapy Evaluation Patient Details Name: [name] MRN: [id number] DOB: January 13, 1966 Today's Date: 12/03/2013    History of Present Illness Joshua BishopMichael D Previti is a 48 y.o. male with a history of developmental disability, obstructive sleep apnea, COPD, and tobacco abuse. He also has a history of acute respiratory failure with hypoxia secondary to obstructive sleep apnea and opiate-induced altered mentation during the hospitalization in May of 2014. Prior to his recent ER visits, he had been ambulating with a walker with no difficulty per history. The patient initially presented to the emergency department on 11/28/2013 with a fall. Apparently, at that time, he rolled out of bed and fell on the floor. He complained of cramping in his left thigh. X-rays at that time revealed no acute fractures. He was given Flexeril and then discharged back to [facility] assisted living. Apparently, he was instructed to ambulate via a wheelchair. He presented again on 7/25 and 7/26 for recurrent falls from his wheelchair. More x-rays were ordered and revealed no acute fractures. CT of his head was negative. He returns again today for the same.  He reports falling asleep in the wheelchair and falling on the floor. He does not recall passing out. He has no history of seizures. He denies any prior headache, chest pain, palpitations, shortness of breath, fever, chills, nausea, vomiting, or diarrhea. He complains of severe cramping pain in both of his legs mostly over his thighs. It is so painful that he cannot walk. He also cannot straighten out his legs without pain being excruciating. It radiates to his groin. He denies neck pain or lower back pain.     Clinical Impression   Pt is presenting to acute OT with above situation.  He verbalizes previously being modified independent with ADL needs. He is currently in significant pain in LLE, only alleviated by remaining in significantly flexed position.  Pt required  max assist to sit edge of bed, but was only able to maintain (with max assist) for 1-2 minutes prior to experiencing significantly increased pain.  While sitting edge of bed, pt also had instance of BLE hips and knees beginning to extend - pt reported that this was not volitional movement and could not stop extension and tremors, despite significnaly increased pain (presenting at 10/10).  Required OTR assist to return to flexed position to decrease pain.  Pt will continued to benefit from skilled OT services while in the hospital setting, and will benefit from continuing OT services in SNF.  Recommend SNF OT at this time.    Follow Up Recommendations  SNF    Equipment Recommendations   (Defer to SNF)    Recommendations for Other Services       Precautions / Restrictions Precautions Precautions: Fall Restrictions Weight Bearing Restrictions: No      Mobility Bed Mobility Overal bed mobility: Needs Assistance Bed Mobility: Sidelying to Sit;Sit to Sidelying   Sidelying to sit: Max assist     Sit to sidelying: Max assist General bed mobility comments: Pt wtih increased difficulty in bed mobility due to flexion of hips and knees - pt verbalized inability to straighten.  During sidelying to sit transfer, pt hadmoment of significanly increased pain with BLE  began to extend and tremble (pt verbalzes he was not trying to make them extend).  Therapist assisted in returning BLE to flexed positon, and pt verbalized relief from pain. Pt only able to tolerate sitting EOB (with max assist for balance) for 1-2 minutes).  Transfers  Balance Overall balance assessment: Needs assistance Sitting-balance support: Bilateral upper extremity supported;Feet unsupported Sitting balance-Leahy Scale: Zero                                      ADL Overall ADL's : Needs assistance/impaired Eating/Feeding: Set up                   Lower Body Dressing:  Maximal assistance   Toilet Transfer: Maximal assistance       Tub/ Shower Transfer: Maximal assistance   Functional mobility during ADLs: Maximal assistance       Vision                     Perception     Praxis      Pertinent Vitals/Pain Pt verbalized 'a little bit' of pain at the beginning and end of session, but had moments of increased pain (presenting at 10/10) during bed mobility.     Hand Dominance Right   Extremity/Trunk Assessment Upper Extremity Assessment Upper Extremity Assessment: Generalized weakness (Difficult to assess due to pt's preferred position (sidelying) for pain management in LLU)   Lower Extremity Assessment Lower Extremity Assessment: Defer to PT evaluation       Communication Communication Communication: No difficulties   Cognition Arousal/Alertness: Awake/alert Behavior During Therapy: WFL for tasks assessed/performed;Impulsive Overall Cognitive Status: History of cognitive impairments - at baseline                     General Comments       Exercises       Shoulder Instructions      Home Living Family/patient expects to be discharged to:: Skilled nursing facility                             Home Equipment: Walker - 2 wheels          Prior Functioning/Environment Level of Independence: Independent with assistive device(s)        Comments: pt reports independence with ADLs.  May have been receiving assist with IADLs (was living in assisted living). Reports using walker to ambulate.    OT Diagnosis: Generalized weakness   OT Problem List: Decreased strength;Decreased range of motion;Decreased activity tolerance;Impaired balance (sitting and/or standing);Decreased coordination;Pain   OT Treatment/Interventions: Self-care/ADL training;Therapeutic exercise;Energy conservation;Patient/family education;DME and/or AE instruction;Therapeutic activities    OT Goals(Current goals can be found in the  care plan section) Acute Rehab OT Goals Patient Stated Goal: No OT goals stated OT Goal Formulation: With patient Time For Goal Achievement: 12/17/13 Potential to Achieve Goals: Fair ADL Goals Pt Will Perform Upper Body Dressing: with supervision Pt Will Perform Lower Body Dressing: with min assist Pt Will Transfer to Toilet: with mod assist Pt/caregiver will Perform Home Exercise Program: Both right and left upper extremity;Increased strength  OT Frequency: Min 2X/week   Barriers to D/C:            Co-evaluation              End of Session Nurse Communication: Patient requests pain meds;Mobility status  Activity Tolerance: Patient limited by pain Patient left: in bed;with call bell/phone within reach;with bed alarm set   Time: 1610-9604 OT Time Calculation (min): 25 min Charges:  OT General Charges $OT Visit: 1 Procedure OT Evaluation $Initial OT Evaluation Tier I: 1  Procedure G-Codes:     Marry Guan, MS, OTR/L [phone]  12/03/2013, 10:36 AM

## 2013-12-03 NOTE — Progress Notes (Signed)
Patient ID: [name], male   DOB: [birth year], 48 y.o.   MRN: [id number]  Ravia A. Merlene Laughter, MD     [email or web address]          [name] is an 48 y.o. male.   Assessment/Plan:    Subacute gait disorder of unclear etiology. However, the exam suggests cervical myelopathy with brisk reflexes and upgoing toes. Therefore, a cervical spine MRI will be obtained. Seemed to have a lot of pain which is likely due to spasms. Baclofen or other anxiolytic/benzodiazepines are suggested. Use pain medication when necessary. Cervical myelopathy in my estimation seem to be the most likely explanation for the patient's leg weakness. We'll therefore arrange for a cervical and thoracic myelogram. I did explain this the patient he agrees to proceed. His heparin will be held and the regions were made for them are going to be done after worse. CPK has been reduced dramatically which argues against myopathy. The rapid drop in CPK makes me think that the initial recording may have been erroneous.  Primary Patient does have an elevated CPK which is likely due to rhabdomyolysis as opposed to a myopathy. Hypersomnia suspected due to sleep apnea possibly medication effect. The patient has been placed on positive pressure.   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 He continues reports severe pain involving the legs. He continues to have contractures of the legs. Appears that the patient did have an attempt at an MRI or using the emergency room per Dr. Maralyn Sago physician's notes. However, he could not get this done because of the contractures.  The patient has refused his EEG.      GENERAL: Pleasant man who seems to be in significant pain involving the legs.  HEENT: Supple. There are significant bruises noted that the nasal bridge and for head.  ABDOMEN: soft  EXTREMITIES: No edema  BACK: Normal.  SKIN: Normal by  inspection.  MENTAL STATUS: Alert and oriented To hospital month and year. He also is oriented to his overall medical condition. Speech, language and cognition are generally Unremarkable. Judgment and insight Fair.  CRANIAL NERVES: Pupils are equal, round and reactive to light and accommodation; extra ocular movements are full, there is no significant nystagmus; visual fields are full; upper and lower facial muscles are normal in strength and symmetric, there is no flattening of the nasolabial folds; tongue is midline; uvula is midline; shoulder elevation is normal.  MOTOR: Normal tone, bulk and strength- In the upper extremities; no pronator drift. He has at least antigravity strength in the legs but there appears to be contracture at the knees which is new per the patient. He is complaining of severe pain in the legs. There appears to be increased tone in the legs.  COORDINATION: Left finger to nose is normal, right finger to nose is normal, No rest tremor; no intention tremor; no postural tremor; no bradykinesia.  REFLEXES: Deep tendon reflexes are symmetrical But pathologically brisk throughout including the upper extremities. Babinski reflexes are Extensor bilaterally.  SENSATION: Normal to light touch.        Objective: Vital signs in last 24 hours: Temp:  [98.1 F (36.7 C)-98.5 F (36.9 C)] 98.1 F (36.7 C) (07/28 1438) Pulse Rate:  [108-115] 108 (07/28 1438) Resp:  [18-20] 18 (07/28 1438) BP: (104-117)/(63-80) 117/75 mmHg (07/28 1438) SpO2:  [96 %-99 %] 98 % (07/28 1951) Weight:  [86.7 kg (191 lb 2.2  oz)] 86.7 kg (191 lb 2.2 oz) (07/28 0300)  Intake/Output from previous day: 07/27 0701 - 07/28 0700 In: 240 [P.O.:240] Out: 1400 [Urine:1400] Intake/Output this shift:   Nutritional status: Full Liquid   Lab Results: Results for orders placed during the hospital encounter of 12/02/13 (from the past 48 hour(s))  BASIC METABOLIC PANEL     Status: Abnormal   Collection Time     12/02/13 12:34 PM      Result Value Ref Range   Sodium 150 (*) 137 - 147 mEq/L   Potassium 4.4  3.7 - 5.3 mEq/L   Chloride 114 (*) 96 - 112 mEq/L   CO2 26  19 - 32 mEq/L   Glucose, Bld 92  70 - 99 mg/dL   BUN 23  6 - 23 mg/dL   Creatinine, Ser 1.35  0.50 - 1.35 mg/dL   Calcium 8.9  8.4 - 10.5 mg/dL   GFR calc non Af Amer 61 (*) >90 mL/min   GFR calc Af Amer 71 (*) >90 mL/min   Comment: (NOTE)     The eGFR has been calculated using the CKD EPI equation.     This calculation has not been validated in all clinical situations.     eGFR's persistently <90 mL/min signify possible Chronic Kidney     Disease.   Anion gap 10  5 - 15  CK     Status: Abnormal   Collection Time    12/02/13 12:34 PM      Result Value Ref Range   Total CK 2997 (*) 7 - 232 U/L  CBC WITH DIFFERENTIAL     Status: Abnormal   Collection Time    12/02/13 12:34 PM      Result Value Ref Range   WBC 11.1 (*) 4.0 - 10.5 K/uL   RBC 3.70 (*) 4.22 - 5.81 MIL/uL   Hemoglobin 11.7 (*) 13.0 - 17.0 g/dL   HCT 37.0 (*) 39.0 - 52.0 %   MCV 100.0  78.0 - 100.0 fL   MCH 31.6  26.0 - 34.0 pg   MCHC 31.6  30.0 - 36.0 g/dL   RDW 14.9  11.5 - 15.5 %   Platelets 237  150 - 400 K/uL   Neutrophils Relative % 71  43 - 77 %   Neutro Abs 7.9 (*) 1.7 - 7.7 K/uL   Lymphocytes Relative 19  12 - 46 %   Lymphs Abs 2.1  0.7 - 4.0 K/uL   Monocytes Relative 9  3 - 12 %   Monocytes Absolute 1.0  0.1 - 1.0 K/uL   Eosinophils Relative 1  0 - 5 %   Eosinophils Absolute 0.1  0.0 - 0.7 K/uL   Basophils Relative 0  0 - 1 %   Basophils Absolute 0.0  0.0 - 0.1 K/uL  TSH     Status: None   Collection Time    12/02/13 10:49 PM      Result Value Ref Range   TSH 2.120  0.350 - 4.500 uIU/mL   Comment: Performed at [facility] PANEL     Status: Abnormal   Collection Time    12/03/13  5:43 AM      Result Value Ref Range   Sodium 150 (*) 137 - 147 mEq/L   Potassium 4.7  3.7 - 5.3 mEq/L   Chloride 116 (*) 96 -  112 mEq/L   CO2 24  19 - 32 mEq/L   Glucose, Bld 72  70 - 99 mg/dL   BUN 19  6 - 23 mg/dL   Creatinine, Ser 1.21  0.50 - 1.35 mg/dL   Calcium 8.8  8.4 - 10.5 mg/dL   Total Protein 6.4  6.0 - 8.3 g/dL   Albumin 3.1 (*) 3.5 - 5.2 g/dL   AST 66 (*) 0 - 37 U/L   ALT 41  0 - 53 U/L   Alkaline Phosphatase 106  39 - 117 U/L   Total Bilirubin 0.3  0.3 - 1.2 mg/dL   GFR calc non Af Amer 70 (*) >90 mL/min   GFR calc Af Amer 81 (*) >90 mL/min   Comment: (NOTE)     The eGFR has been calculated using the CKD EPI equation.     This calculation has not been validated in all clinical situations.     eGFR's persistently <90 mL/min signify possible Chronic Kidney     Disease.   Anion gap 10  5 - 15  CBC     Status: Abnormal   Collection Time    12/03/13  5:43 AM      Result Value Ref Range   WBC 9.4  4.0 - 10.5 K/uL   RBC 3.64 (*) 4.22 - 5.81 MIL/uL   Hemoglobin 11.8 (*) 13.0 - 17.0 g/dL   HCT 37.0 (*) 39.0 - 52.0 %   MCV 101.6 (*) 78.0 - 100.0 fL   MCH 32.4  26.0 - 34.0 pg   MCHC 31.9  30.0 - 36.0 g/dL   RDW 14.9  11.5 - 15.5 %   Platelets 217  150 - 400 K/uL  VITAMIN B12     Status: None   Collection Time    12/03/13  5:43 AM      Result Value Ref Range   Vitamin B-12 327  211 - 911 pg/mL   Comment: Performed at [facility]     Status: None   Collection Time    12/03/13  5:43 AM      Result Value Ref Range   Homocysteine 11.7  4.0 - 15.4 umol/L   Comment: Performed at [place]     Status: None   Collection Time    12/03/13  5:43 AM      Result Value Ref Range   Total CK 12  7 - 232 U/L   CK, MB <1.0  0.3 - 4.0 ng/mL   Relative Index NOT CALCULATED  0.0 - 2.5   Comment: Performed at [facility]     Status: Abnormal   Collection Time    12/03/13  5:43 AM      Result Value Ref Range   Sed Rate 37 (*) 0 - 16 mm/hr    Lipid Panel No results found for this basename: CHOL, TRIG, HDL, CHOLHDL, VLDL,  LDLCALC,  in the last 72 hours  Studies/Results: Dg Lumbar Spine 2-3 Views  12/02/2013   CLINICAL DATA:  Pain post trauma  EXAM: LUMBAR SPINE - 2-3 VIEW  COMPARISON:  Report of prior lumbar series June 18, 2011 available ; images from that study cannot be retrieved at this time.  FINDINGS: Frontal and lateral views were obtained. There are 5 non-rib-bearing lumbar type vertebral bodies. There is lumbar levoscoliosis. There is remodeling of the T11, T12, L1, and L2 vertebral bodies with what appears to be chronic wedging at these levels. No acute appearing fracture is seen. There is 1.7 cm  of anterolisthesis of L5 on S1. No other spondylolisthesis is seen.  There is moderately severe disc space narrowing at all levels. No erosive change.  IMPRESSION: Probable fractures in the lower thoracic and upper lumbar region. 1.7 cm of anterolisthesis of L5 on S1. By report, there was 1.1 cm of anterolisthesis of L5 on S1 on the previous study. Suspect progression of arthropathy as the cause of the increase in spondylolisthesis, although traumatic etiology cannot be excluded. There is scoliosis as well as multilevel arthropathy.   Electronically Signed   By: Lowella Grip M.D.   On: 12/02/2013 09:47   Dg Hip Complete Left  12/02/2013   CLINICAL DATA:  Left hip pain status post fall  EXAM: LEFT HIP - COMPLETE 2+ VIEW  COMPARISON:  None.  FINDINGS: There is no obvious hip fracture or dislocation. There is no lytic or blastic osseous lesion. Examination is limited secondary to difficulty in patient positioning secondary to pain.  Degenerative changes of the lower lumbar spine.  IMPRESSION: 1. Limited exam secondary to suboptimal positioning. No evidence of acute fracture or dislocation. If there is persistent clinical concern, and patient is unable to tolerate satisfactory positioning further evaluation with MRI may be helpful.   Electronically Signed   By: Kathreen Devoid   On: 12/02/2013 09:45   Ct Cervical Spine Wo  Contrast  12/02/2013   CLINICAL DATA:  Fall.  Evaluate for injury.  EXAM: CT CERVICAL, THORACIC, AND LUMBAR SPINE WITHOUT CONTRAST  TECHNIQUE: Multidetector CT imaging of the cervical, thoracic and lumbar spine was performed without intravenous contrast. Multiplanar CT image reconstructions were also generated.  COMPARISON:  CT cervical spine 08/08/2013 and CT chest 09/19/2012. CT pelvis 11/29/2013.  FINDINGS: CT CERVICAL SPINE FINDINGS  There is straightening of the normal cervical lordosis. Slight flattening of the C5 superior endplate is new from 53/61/4431. Vertebral body height is otherwise maintained and unchanged. Advanced degenerative disc disease at C3-4 with marked loss of disc space height. Neural foramina appear widely patent. Visualized portions of the intracranial contents show no acute findings. Soft tissues are unremarkable.  CT THORACIC SPINE FINDINGS  Alignment is anatomic, including at the cervicothoracic junction. Vertebral body height is maintained. Scattered endplate degenerative changes. Visualized portions of the ribs show no fracture. Visualized portions of the lungs shows scattered subsegmental atelectasis or scarring. No pleural fluid.  CT LUMBAR SPINE FINDINGS  Straightening of the normal lumbar lordosis. Approximately 12 mm anterolisthesis of L5 on S1 with severe degenerative disc disease and obliteration of the joint space, as on 11/29/2013. There are chronic bilateral L5 pars defects. Vertebral body height and alignment are otherwise maintained. Endplate degenerative changes and prominent Schmorl's nodes are seen of the thoracolumbar junction as well as at L1-2 and L2-3.  Visualized portions of the kidneys and retroperitoneum show no acute findings. Bladder is distended.  IMPRESSION: 1. C5 superior endplate appears slightly compressed when compared with 08/08/2013. Otherwise, no evidence of an acute fracture or subluxation. 2. Multilevel spondylosis. 3. Grade 2 anterolisthesis of L5  on S1, secondary to chronic bilateral pars defects, with advanced secondary degenerative disc disease.   Electronically Signed   By: Lorin Picket M.D.   On: 12/02/2013 17:46   Ct Thoracic Spine Wo Contrast  12/02/2013   CLINICAL DATA:  Fall.  Evaluate for injury.  EXAM: CT CERVICAL, THORACIC, AND LUMBAR SPINE WITHOUT CONTRAST  TECHNIQUE: Multidetector CT imaging of the cervical, thoracic and lumbar spine was performed without intravenous contrast. Multiplanar CT image reconstructions were  also generated.  COMPARISON:  CT cervical spine 08/08/2013 and CT chest 09/19/2012. CT pelvis 11/29/2013.  FINDINGS: CT CERVICAL SPINE FINDINGS  There is straightening of the normal cervical lordosis. Slight flattening of the C5 superior endplate is new from 38/45/3646. Vertebral body height is otherwise maintained and unchanged. Advanced degenerative disc disease at C3-4 with marked loss of disc space height. Neural foramina appear widely patent. Visualized portions of the intracranial contents show no acute findings. Soft tissues are unremarkable.  CT THORACIC SPINE FINDINGS  Alignment is anatomic, including at the cervicothoracic junction. Vertebral body height is maintained. Scattered endplate degenerative changes. Visualized portions of the ribs show no fracture. Visualized portions of the lungs shows scattered subsegmental atelectasis or scarring. No pleural fluid.  CT LUMBAR SPINE FINDINGS  Straightening of the normal lumbar lordosis. Approximately 12 mm anterolisthesis of L5 on S1 with severe degenerative disc disease and obliteration of the joint space, as on 11/29/2013. There are chronic bilateral L5 pars defects. Vertebral body height and alignment are otherwise maintained. Endplate degenerative changes and prominent Schmorl's nodes are seen of the thoracolumbar junction as well as at L1-2 and L2-3.  Visualized portions of the kidneys and retroperitoneum show no acute findings. Bladder is distended.  IMPRESSION: 1.  C5 superior endplate appears slightly compressed when compared with 08/08/2013. Otherwise, no evidence of an acute fracture or subluxation. 2. Multilevel spondylosis. 3. Grade 2 anterolisthesis of L5 on S1, secondary to chronic bilateral pars defects, with advanced secondary degenerative disc disease.   Electronically Signed   By: Lorin Picket M.D.   On: 12/02/2013 17:46   Ct Lumbar Spine Wo Contrast  12/02/2013   CLINICAL DATA:  Fall.  Evaluate for injury.  EXAM: CT CERVICAL, THORACIC, AND LUMBAR SPINE WITHOUT CONTRAST  TECHNIQUE: Multidetector CT imaging of the cervical, thoracic and lumbar spine was performed without intravenous contrast. Multiplanar CT image reconstructions were also generated.  COMPARISON:  CT cervical spine 08/08/2013 and CT chest 09/19/2012. CT pelvis 11/29/2013.  FINDINGS: CT CERVICAL SPINE FINDINGS  There is straightening of the normal cervical lordosis. Slight flattening of the C5 superior endplate is new from 80/32/1224. Vertebral body height is otherwise maintained and unchanged. Advanced degenerative disc disease at C3-4 with marked loss of disc space height. Neural foramina appear widely patent. Visualized portions of the intracranial contents show no acute findings. Soft tissues are unremarkable.  CT THORACIC SPINE FINDINGS  Alignment is anatomic, including at the cervicothoracic junction. Vertebral body height is maintained. Scattered endplate degenerative changes. Visualized portions of the ribs show no fracture. Visualized portions of the lungs shows scattered subsegmental atelectasis or scarring. No pleural fluid.  CT LUMBAR SPINE FINDINGS  Straightening of the normal lumbar lordosis. Approximately 12 mm anterolisthesis of L5 on S1 with severe degenerative disc disease and obliteration of the joint space, as on 11/29/2013. There are chronic bilateral L5 pars defects. Vertebral body height and alignment are otherwise maintained. Endplate degenerative changes and prominent  Schmorl's nodes are seen of the thoracolumbar junction as well as at L1-2 and L2-3.  Visualized portions of the kidneys and retroperitoneum show no acute findings. Bladder is distended.  IMPRESSION: 1. C5 superior endplate appears slightly compressed when compared with 08/08/2013. Otherwise, no evidence of an acute fracture or subluxation. 2. Multilevel spondylosis. 3. Grade 2 anterolisthesis of L5 on S1, secondary to chronic bilateral pars defects, with advanced secondary degenerative disc disease.   Electronically Signed   By: Lorin Picket M.D.   On: 12/02/2013 17:46  Medications:  Scheduled Meds: . budesonide  0.25 mg Nebulization BID  . diazepam  10 mg Oral BID  . heparin  5,000 Units Subcutaneous 3 times per day  . senna  1 tablet Oral QHS   Continuous Infusions: . 0.45 % NaCl with KCl 20 mEq / L 125 mL/hr at 12/03/13 1457   PRN Meds:.acetaminophen, acetaminophen, albuterol, alum & mag hydroxide-simeth, guaiFENesin-dextromethorphan, HYDROcodone-acetaminophen, morphine injection, ondansetron (ZOFRAN) IV, ondansetron     LOS: 1 day   Eliu Batch A. Merlene Laughter, M.D.  Diplomate, [employer] adviser of Psychiatry and Neurology ( Neurology).

## 2013-12-03 NOTE — Progress Notes (Signed)
Patient did not want to wear CPAP mask last night, still does not want to wear tonight- will monitor patient on [facility].

## 2013-12-03 NOTE — Progress Notes (Signed)
Pt was fitted with a CPAP mask but was uncomfortable due his facial injuries. Patient explained he has never worn a CPAP or BiPAP mask even with his OSA history. The mask was removed and set side. Spoke with patient nurse about pt condition and will continue to monitor patient on 2lpm  for the remainder of the night.

## 2013-12-03 NOTE — ED Provider Notes (Signed)
Medical screening examination/treatment/procedure(s) were performed by non-physician practitioner and as supervising physician I was immediately available for consultation/collaboration.   EKG Interpretation None        Benny LennertJoseph L Antoinette Haskett, MD 12/03/13 [phone]

## 2013-12-03 NOTE — Progress Notes (Addendum)
[facility] PROGRESS NOTE  [name] WUJ:[id number] DOB: Apr 09, 1966 DOA: 12/02/2013 PCP: Default, Provider, MD    Code Status: Full code Family Communication: Family not available; discussed with patient. Disposition Plan: Patient will likely need skilled nursing facility placement upon discharge in a few days.   Consultants:  Neurology  Procedures:  None  Antibiotics:  None  HPI/Subjective: The patient is laying in bed. He finished approximately 50% of his breakfast. He has no pain now, but says that each time someone tries to move his legs, they hurt. He denies back pain or neck pain.  Objective: Filed Vitals:   12/03/13 0618  BP: 117/63  Pulse: 115  Temp: 98.5 F (36.9 C)  Resp: 20    Intake/Output Summary (Last 24 hours) at 12/03/13 1143 Last data filed at 12/03/13 9562  Gross per 24 hour  Intake    240 ml  Output   1400 ml  Net  -1160 ml   Filed Weights   12/02/13 0800 12/02/13 1526 12/03/13 0300  Weight: 90.719 kg (200 lb) 86.773 kg (191 lb 4.8 oz) 86.7 kg (191 lb 2.2 oz)    Exam:   General:  48 year-old man laying in bed with his legs flexed, in no acute distress.  Cardiovascular: S1, S2, with no murmurs rubs or gallops.  Respiratory: Occasional crackles noted mid lobes. Breathing is nonlabored.  Abdomen: Positive bowel sounds, soft, nontender, nondistended.  Skin: Stable abrasions on his frontal scalp, for head, and bridge of nose. Also a few excoriations on his legs bilaterally without drainage or significant erythema.  Musculoskeletal/extremities: Trace of pedal edema. Moderate tenderness over palpation of his quadriceps muscles right greater than left. The patient resists having both legs extended.  Neurologic: He is alert and oriented x3. Cranial nerves II through XII are intact. Plantar reflexes with upgoing toes upon new examination. Upper extremity strength and sensation intact with strength 5 over 5. The patient resists  testing of his lower extremities.   Data Reviewed: Basic Metabolic Panel:  Recent Labs Lab 11/30/13 1250 12/02/13 1234 12/03/13 0543  NA 148* 150* 150*  K 4.4 4.4 4.7  CL 112 114* 116*  CO2 26 26 24   GLUCOSE 143* 92 72  BUN 24* 23 19  CREATININE 1.28 1.35 1.21  CALCIUM 9.2 8.9 8.8   Liver Function Tests:  Recent Labs Lab 11/30/13 1250 12/03/13 0543  AST 39* 66*  ALT 29 41  ALKPHOS 117 106  BILITOT <0.2* 0.3  PROT 7.2 6.4  ALBUMIN 3.5 3.1*   No results found for this basename: LIPASE, AMYLASE,  in the last 168 hours No results found for this basename: AMMONIA,  in the last 168 hours CBC:  Recent Labs Lab 11/30/13 1250 12/02/13 1234 12/03/13 0543  WBC 10.0 11.1* 9.4  NEUTROABS 6.9 7.9*  --   HGB 12.6* 11.7* 11.8*  HCT 38.7* 37.0* 37.0*  MCV 99.0 100.0 101.6*  PLT 238 237 217   Cardiac Enzymes:  Recent Labs Lab 12/02/13 1234  CKTOTAL 2997*   BNP (last 3 results) No results found for this basename: PROBNP,  in the last 8760 hours CBG: No results found for this basename: GLUCAP,  in the last 168 hours  No results found for this or any previous visit (from the past 240 hour(s)).   Studies: Dg Lumbar Spine 2-3 Views  12/02/2013   CLINICAL DATA:  Pain post trauma  EXAM: LUMBAR SPINE - 2-3 VIEW  COMPARISON:  Report of prior lumbar series June 18, 2011 available ; images from that study cannot be retrieved at this time.  FINDINGS: Frontal and lateral views were obtained. There are 5 non-rib-bearing lumbar type vertebral bodies. There is lumbar levoscoliosis. There is remodeling of the T11, T12, L1, and L2 vertebral bodies with what appears to be chronic wedging at these levels. No acute appearing fracture is seen. There is 1.7 cm of anterolisthesis of L5 on S1. No other spondylolisthesis is seen.  There is moderately severe disc space narrowing at all levels. No erosive change.  IMPRESSION: Probable fractures in the lower thoracic and upper lumbar region. 1.7  cm of anterolisthesis of L5 on S1. By report, there was 1.1 cm of anterolisthesis of L5 on S1 on the previous study. Suspect progression of arthropathy as the cause of the increase in spondylolisthesis, although traumatic etiology cannot be excluded. There is scoliosis as well as multilevel arthropathy.   Electronically Signed   By: Bretta Bang M.D.   On: 12/02/2013 09:47   Dg Hip Complete Left  12/02/2013   CLINICAL DATA:  Left hip pain status post fall  EXAM: LEFT HIP - COMPLETE 2+ VIEW  COMPARISON:  None.  FINDINGS: There is no obvious hip fracture or dislocation. There is no lytic or blastic osseous lesion. Examination is limited secondary to difficulty in patient positioning secondary to pain.  Degenerative changes of the lower lumbar spine.  IMPRESSION: 1. Limited exam secondary to suboptimal positioning. No evidence of acute fracture or dislocation. If there is persistent clinical concern, and patient is unable to tolerate satisfactory positioning further evaluation with MRI may be helpful.   Electronically Signed   By: Elige Ko   On: 12/02/2013 09:45   Ct Cervical Spine Wo Contrast  12/02/2013   CLINICAL DATA:  Fall.  Evaluate for injury.  EXAM: CT CERVICAL, THORACIC, AND LUMBAR SPINE WITHOUT CONTRAST  TECHNIQUE: Multidetector CT imaging of the cervical, thoracic and lumbar spine was performed without intravenous contrast. Multiplanar CT image reconstructions were also generated.  COMPARISON:  CT cervical spine 08/08/2013 and CT chest 09/19/2012. CT pelvis 11/29/2013.  FINDINGS: CT CERVICAL SPINE FINDINGS  There is straightening of the normal cervical lordosis. Slight flattening of the C5 superior endplate is new from 08/08/2013. Vertebral body height is otherwise maintained and unchanged. Advanced degenerative disc disease at C3-4 with marked loss of disc space height. Neural foramina appear widely patent. Visualized portions of the intracranial contents show no acute findings. Soft tissues  are unremarkable.  CT THORACIC SPINE FINDINGS  Alignment is anatomic, including at the cervicothoracic junction. Vertebral body height is maintained. Scattered endplate degenerative changes. Visualized portions of the ribs show no fracture. Visualized portions of the lungs shows scattered subsegmental atelectasis or scarring. No pleural fluid.  CT LUMBAR SPINE FINDINGS  Straightening of the normal lumbar lordosis. Approximately 12 mm anterolisthesis of L5 on S1 with severe degenerative disc disease and obliteration of the joint space, as on 11/29/2013. There are chronic bilateral L5 pars defects. Vertebral body height and alignment are otherwise maintained. Endplate degenerative changes and prominent Schmorl's nodes are seen of the thoracolumbar junction as well as at L1-2 and L2-3.  Visualized portions of the kidneys and retroperitoneum show no acute findings. Bladder is distended.  IMPRESSION: 1. C5 superior endplate appears slightly compressed when compared with 08/08/2013. Otherwise, no evidence of an acute fracture or subluxation. 2. Multilevel spondylosis. 3. Grade 2 anterolisthesis of L5 on S1, secondary to chronic bilateral pars defects, with advanced secondary degenerative disc disease.  Electronically Signed   By: Leanna Battles M.D.   On: 12/02/2013 17:46   Ct Thoracic Spine Wo Contrast  12/02/2013   CLINICAL DATA:  Fall.  Evaluate for injury.  EXAM: CT CERVICAL, THORACIC, AND LUMBAR SPINE WITHOUT CONTRAST  TECHNIQUE: Multidetector CT imaging of the cervical, thoracic and lumbar spine was performed without intravenous contrast. Multiplanar CT image reconstructions were also generated.  COMPARISON:  CT cervical spine 08/08/2013 and CT chest 09/19/2012. CT pelvis 11/29/2013.  FINDINGS: CT CERVICAL SPINE FINDINGS  There is straightening of the normal cervical lordosis. Slight flattening of the C5 superior endplate is new from 08/08/2013. Vertebral body height is otherwise maintained and unchanged.  Advanced degenerative disc disease at C3-4 with marked loss of disc space height. Neural foramina appear widely patent. Visualized portions of the intracranial contents show no acute findings. Soft tissues are unremarkable.  CT THORACIC SPINE FINDINGS  Alignment is anatomic, including at the cervicothoracic junction. Vertebral body height is maintained. Scattered endplate degenerative changes. Visualized portions of the ribs show no fracture. Visualized portions of the lungs shows scattered subsegmental atelectasis or scarring. No pleural fluid.  CT LUMBAR SPINE FINDINGS  Straightening of the normal lumbar lordosis. Approximately 12 mm anterolisthesis of L5 on S1 with severe degenerative disc disease and obliteration of the joint space, as on 11/29/2013. There are chronic bilateral L5 pars defects. Vertebral body height and alignment are otherwise maintained. Endplate degenerative changes and prominent Schmorl's nodes are seen of the thoracolumbar junction as well as at L1-2 and L2-3.  Visualized portions of the kidneys and retroperitoneum show no acute findings. Bladder is distended.  IMPRESSION: 1. C5 superior endplate appears slightly compressed when compared with 08/08/2013. Otherwise, no evidence of an acute fracture or subluxation. 2. Multilevel spondylosis. 3. Grade 2 anterolisthesis of L5 on S1, secondary to chronic bilateral pars defects, with advanced secondary degenerative disc disease.   Electronically Signed   By: Leanna Battles M.D.   On: 12/02/2013 17:46   Ct Lumbar Spine Wo Contrast  12/02/2013   CLINICAL DATA:  Fall.  Evaluate for injury.  EXAM: CT CERVICAL, THORACIC, AND LUMBAR SPINE WITHOUT CONTRAST  TECHNIQUE: Multidetector CT imaging of the cervical, thoracic and lumbar spine was performed without intravenous contrast. Multiplanar CT image reconstructions were also generated.  COMPARISON:  CT cervical spine 08/08/2013 and CT chest 09/19/2012. CT pelvis 11/29/2013.  FINDINGS: CT CERVICAL  SPINE FINDINGS  There is straightening of the normal cervical lordosis. Slight flattening of the C5 superior endplate is new from 08/08/2013. Vertebral body height is otherwise maintained and unchanged. Advanced degenerative disc disease at C3-4 with marked loss of disc space height. Neural foramina appear widely patent. Visualized portions of the intracranial contents show no acute findings. Soft tissues are unremarkable.  CT THORACIC SPINE FINDINGS  Alignment is anatomic, including at the cervicothoracic junction. Vertebral body height is maintained. Scattered endplate degenerative changes. Visualized portions of the ribs show no fracture. Visualized portions of the lungs shows scattered subsegmental atelectasis or scarring. No pleural fluid.  CT LUMBAR SPINE FINDINGS  Straightening of the normal lumbar lordosis. Approximately 12 mm anterolisthesis of L5 on S1 with severe degenerative disc disease and obliteration of the joint space, as on 11/29/2013. There are chronic bilateral L5 pars defects. Vertebral body height and alignment are otherwise maintained. Endplate degenerative changes and prominent Schmorl's nodes are seen of the thoracolumbar junction as well as at L1-2 and L2-3.  Visualized portions of the kidneys and retroperitoneum show no acute findings. Bladder is  distended.  IMPRESSION: 1. C5 superior endplate appears slightly compressed when compared with 08/08/2013. Otherwise, no evidence of an acute fracture or subluxation. 2. Multilevel spondylosis. 3. Grade 2 anterolisthesis of L5 on S1, secondary to chronic bilateral pars defects, with advanced secondary degenerative disc disease.   Electronically Signed   By: Leanna BattlesMelinda  Blietz M.D.   On: 12/02/2013 17:46    Scheduled Meds: . budesonide  0.25 mg Nebulization BID  . diazepam  10 mg Oral BID  . heparin  5,000 Units Subcutaneous 3 times per day  . senna  1 tablet Oral QHS   Continuous Infusions: . 0.45 % NaCl with KCl 20 mEq / L 125 mL/hr at  12/03/13 [id number]   Assessment and plan:  Principal Problem:   Multiple falls Active Problems:   Bilateral leg pain   Degenerative joint disease of spine   Lumbosacral spondylosis without myelopathy   Cervical spondylosis with myelopathy   Dehydration with hypernatremia   Rhabdomyolysis   OSA (obstructive sleep apnea)   Skin abrasion   COPD with chronic bronchitis   Tobacco abuse    Multiple falls with gait disorder After multiple falls at the assisted living, the patient has been relegated to a wheelchair because of possible flexion contractures or severe pain of his lower extremity. He attributes the falls to obstructive sleep apnea. He was unable to tolerate the MRI in the ED, so CT of his spine was ordered. The results were significant for severe degenerative changes with spondylosis throughout the spine. There was no indication of cord impingement or compromise. CT of his head was nonacute on admission. Dr. Gerilyn Pilgrimoonquah was consulted and believed that the patient has a cervical myelopathy until proven otherwise. MRI of the cervical spine was ordered, but I doubt that the patient would comply. Studies ordered for evaluation include TSH, vitamin B12, sedimentation rate, homocystine level, and ANA. and EEG. PT/OT ordered. 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 As above.  Bilateral leg pain.  As with Dr. Gerilyn Pilgrimoonquah, I am concerned  that the patient may have a spinal cord injury. He is unable to or refuses to extend his legs bilaterally. He has exquisite pain in his proximal legs when I attempted to extend them. This appears to be worse on the right. He does not complain of neck pain or lower back pain in particular. Of note, the patient had been ambulating with a walker just last week. The other possibility is that he could have a tendon tear or muscle rupture. We will treat his pain with as needed analgesics, but with caution as the patient is very sensitive to large doses  of opiates.  Mild rhabdomyolysis, secondary to falls.  I do not believe the rhabdomyolysis is attributing to his symptomatology. We will continue IV fluid hydration. Followup CK is pending.  Dehydration with hypernatremia. The patient has a history of dehydration/hypernatremia in May of 2014. He denies vomiting or diarrhea. Half-normal saline was started started on admission with no improvement in his serum sodium. We'll continue half-normal saline and if his serum sodium is not improved, we'll change fluids to D5.  Obstructive sleep apnea.  The patient was diagnosed last year. He says that he is supposed to be on CPAP, but apparently it has not been ordered. CPAP was ordered.  COPD with tobacco abuse.  Currently stable. We'll continue his chronic inhalers. The patient was strongly advised to stop smoking. He declines a nicotine patch. Mild leukocytosis. Likely secondary to stress reaction. Now  within normal limits following hydration. No evidence of active infection.  Mild anemia. His anemia panel was virtually within normal limits last year. We'll continue to follow. Vitamin B12 and TSH are pending. 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    Time spent: 35 minutes.    [facility]  [facility] Pager [phone] If 7PM-7AM, please contact night-coverage at [email or web address], password [facility] 12/03/2013, 11:43 AM  LOS: 1 day

## 2013-12-04 ENCOUNTER — Inpatient Hospital Stay (HOSPITAL_COMMUNITY): Payer: Medicaid Other

## 2013-12-04 ENCOUNTER — Other Ambulatory Visit (HOSPITAL_COMMUNITY): Payer: Self-pay

## 2013-12-04 DIAGNOSIS — J449 Chronic obstructive pulmonary disease, unspecified: Secondary | ICD-10-CM

## 2013-12-04 DIAGNOSIS — R6250 Unspecified lack of expected normal physiological development in childhood: Secondary | ICD-10-CM

## 2013-12-04 LAB — BASIC METABOLIC PANEL
Anion gap: 9 (ref 5–15)
BUN: 16 mg/dL (ref 6–23)
CALCIUM: 8.8 mg/dL (ref 8.4–10.5)
CO2: 25 mEq/L (ref 19–32)
Chloride: 113 mEq/L — ABNORMAL HIGH (ref 96–112)
Creatinine, Ser: 1.13 mg/dL (ref 0.50–1.35)
GFR, EST AFRICAN AMERICAN: 88 mL/min — AB (ref >90)
GFR, EST NON AFRICAN AMERICAN: 76 mL/min — AB (ref >90)
GLUCOSE: 90 mg/dL (ref 70–99)
Potassium: 5.1 mEq/L (ref 3.7–5.3)
Sodium: 147 mEq/L (ref 137–147)

## 2013-12-04 LAB — CSF CELL COUNT WITH DIFFERENTIAL
RBC Count, CSF: 4 /mm3 — ABNORMAL HIGH
Tube #: 3
WBC CSF: 2 /mm3 (ref 0–5)

## 2013-12-04 LAB — GLUCOSE, CSF: Glucose, CSF: 74 mg/dL (ref 43–76)

## 2013-12-04 LAB — PROTEIN, CSF: Total  Protein, CSF: 52 mg/dL — ABNORMAL HIGH (ref 15–45)

## 2013-12-04 LAB — ANA: ANA: NEGATIVE

## 2013-12-04 LAB — GLUCOSE, CAPILLARY: Glucose-Capillary: 81 mg/dL (ref 70–99)

## 2013-12-04 MED ORDER — IOHEXOL 300 MG/ML  SOLN
50.0000 mL | Freq: Once | INTRAMUSCULAR | Status: AC | PRN
  Administered 2013-12-04: 10 mL via INTRAVENOUS

## 2013-12-04 MED ORDER — DIAZEPAM 5 MG PO TABS
10.0000 mg | ORAL_TABLET | Freq: Four times a day (QID) | ORAL | Status: DC
  Administered 2013-12-04 – 2013-12-09 (×18): 10 mg via ORAL
  Filled 2013-12-04 (×18): qty 2

## 2013-12-04 MED ORDER — SODIUM CHLORIDE 0.45 % IV SOLN
INTRAVENOUS | Status: DC
  Administered 2013-12-04: 22:00:00 via INTRAVENOUS

## 2013-12-04 MED ORDER — METHYLPREDNISOLONE SODIUM SUCC 1000 MG IJ SOLR
INTRAMUSCULAR | Status: AC
  Filled 2013-12-04: qty 8

## 2013-12-04 MED ORDER — SODIUM CHLORIDE 0.9 % IV SOLN
1000.0000 mg | INTRAVENOUS | Status: DC
  Administered 2013-12-04 – 2013-12-10 (×7): 1000 mg via INTRAVENOUS
  Filled 2013-12-04 (×9): qty 8

## 2013-12-04 NOTE — Clinical Social Work Placement (Addendum)
Clinical Social Work Department CLINICAL SOCIAL WORK PLACEMENT NOTE 12/04/2013  Patient:  [name],Nyan D  Account Number:  [id number] Admit date:  12/02/2013  Clinical Social Worker:  Derenda FennelKARA STULTZ, LCSW  Date/time:  12/03/2013 03:25 PM  Clinical Social Work is seeking post-discharge placement for this patient at the following level of care:   SKILLED NURSING   (*CSW will update this form in Epic as items are completed)   12/03/2013  [facility]/family provided with [facility] of Clinical [facility] Work's list of facilities offering this level of care within the geographic area requested by the patient (or if unable, by the patient's family).  12/03/2013  Patient/family informed of their freedom to choose among providers that offer the needed level of care, that participate in Medicare, Medicaid or managed care program needed by the patient, have an available bed and are willing to accept the patient.  12/03/2013  Patient/family informed of [facility]' ownership interest in [facility], as well as of the fact that they are under no obligation to receive care at this facility.  PASARR submitted to EDS on 12/03/2013 PASARR number received on 12/04/2013  FL2 transmitted to all facilities in geographic area requested by pt/family on  12/03/2013 FL2 transmitted to all facilities within larger geographic area on 12/03/2013  Patient informed that his/her managed care company has contracts with or will negotiate with  certain facilities, including the following:     Patient/family informed of bed offers received:  12/04/2013 [facility] Physician recommends and patient chooses bed at    [facility]  12/11/2013 Patient to be transferred to facility by [facility] Patient and family notified of transfer on 12/11/2013 Name of family member notified:  Luther HearingMelissa Hill (pt's sister)  The following  physician request were entered in Epic:   Additional Comments: 30 day pasarr.  Derenda FennelKara Stultz, [place]CS[place] [phone]  Marcelline DeistEmily Haralambos Yeatts, MSW, [facility] Licensed Clinical Social Worker [phone] and [phone] [phone]

## 2013-12-04 NOTE — Evaluation (Signed)
Physical Therapy Evaluation Patient Details Name: [name] MRN: [id number] DOB: 1965-11-29 Today's Date: 12/04/2013   History of Present Illness  [name] is a 48 y.o. male with a history of developmental disability, obstructive sleep apnea, COPD, and tobacco abuse. He also has a history of acute respiratory failure with hypoxia secondary to obstructive sleep apnea and opiate-induced altered mentation during the hospitalization in May of 2014. Prior to his recent ER visits, he had been ambulating with a walker with no difficulty per history. The patient initially presented to the emergency department on 11/28/2013 with a fall. Apparently, at that time, he rolled out of bed and fell on the floor. He complained of cramping in his left thigh. X-rays at that time revealed no acute fractures. He was given Flexeril and then discharged back to [facility] assisted living. Apparently, he was instructed to ambulate via a wheelchair. He presented again on 7/25 and 7/26 for recurrent falls from his wheelchair. More x-rays were ordered and revealed no acute fractures. CT of his head was negative. He returns again today for the same.  He reports falling asleep in the wheelchair and falling on the floor. He does not recall passing out. He has no history of seizures. He denies any prior headache, chest pain, palpitations, shortness of breath, fever, chills, nausea, vomiting, or diarrhea. He complains of severe cramping pain in both of his legs mostly over his thighs. It is so painful that he cannot walk. He also cannot straighten out his legs without pain being excruciating. It radiates to his groin. He denies neck pain or lower back pain.   CT revela sprobable fracture in low thoracic and upper lumbar spine and anterolisthesis of L5 on S1; imaging difficulty due to flexed position.  Per RN, pt possibly being sent for myelogram of spine per neurology.     Clinical Impression  Pt is a 48 year old male who  presents to physical therapy for assessment of functional mobility skills after severe falls while at an ALF.  When PT entered room, pt was in fetal position in Lt sidelying.  Instructed pt to extend legs, however pt unable stating pain to "privates".  Assisted pt in rolling onto back to assess ROM of (B) LE.  Pt demonstrated significant pain in Lt LE with ROM testing, with PAINAD 8/10, and LE stuck in 90/90 position with limited movement out of position.  Noted mild improvements in ROM on Rt side compared to Lt with knee extension to -60 degrees and pt able to assist with ROM testing on that side.  Pt unable to state if pain or inability to move limited ROM, though when instructed to relax and allow PT to move the leg, no increases in ROM were able to be achieved.  Unknown if pt was resisting movement or if contractures are forming. Pt was able to roll to the Rt with use of handrails and mod assist, and able to roll back to Lt sidelying mod (I).  After rolling to Lt, pt exercises severe pain, PAINAD 10/10, with erratic/not purposeful LE movements due to pain, stating "help me", though pt unable to state what to do to decrease possibly caused by spasming??  Pt instructed in deep breathing and relaxing until pain resolved.  Unable to assess further mobility skills as pt returned to fetal position in Lt sidelying and would not leave position despite encouragement from PT; will continue to assess mobility skills as able.  Recommend continued PT to address  ROM, strengthening, and activity movements for improvement of functional mobility skills with transition to SNF at discharge to continue rehab.  No DME recommendations at this time.      Follow Up Recommendations SNF    Equipment Recommendations   (Will defer to SNF)       Precautions / Restrictions Precautions Precautions: Fall Restrictions Weight Bearing Restrictions: No      Mobility  Bed Mobility Overal bed mobility: Needs Assistance Bed  Mobility: Rolling Rolling: Modified independent (Device/Increase time);Mod assist (Mod (I) to roll Lt, mod assist to roll Rt)         General bed mobility comments: Unable to further assess mobility secondary to pain with rolling.    Transfers                 General transfer comment: Unable to assess due to flexed position of LE and pain with movement.   Ambulation/Gait             General Gait Details: Unable to assess due to flexed position of LE and pain with movement.      Balance       Sitting balance - Comments: Unable to assess as pt  unable to transfer to sitting today.                                      Pertinent Vitals/Pain PAINAD 8/10 after ROM and transfer to sidelying.     Home Living Family/patient expects to be discharged to:: Skilled nursing facility               Home Equipment: Dan Humphreys - 2 wheels Additional Comments: Pt lives at [facility].  Per facility, pt was able to ambulate with use of RW without limitations.  Due to multiple falls, pt was placed in W/C for safety, however has had falls out of W/C.     Prior Function Level of Independence: Independent with assistive device(s)               Hand Dominance   Dominant Hand: Right    Extremity/Trunk Assessment   Upper Extremity Assessment: Defer to OT evaluation           Lower Extremity Assessment: RLE deficits/detail;LLE deficits/detail RLE Deficits / Details: PROM knee extension -60 degrees. Ankle AROM to neutral dorsiflexion.  LLE Deficits / Details: PROM hip flexion stuck at 90 degrees, extension -90 degrees (pt stuck at 90/90 position and unable to extend).  Ankle AROM to neutral dorsiflexion.      Communication   Communication: No difficulties  Cognition Arousal/Alertness: Awake/alert Behavior During Therapy: WFL for tasks assessed/performed;Impulsive Overall Cognitive Status: History of cognitive impairments - at baseline                          Exercises Total Joint Exercises Ankle Circles/Pumps: AROM;Both;5 reps;Sidelying      Assessment/Plan    PT Assessment Patient needs continued PT services  PT Diagnosis Difficulty walking;Generalized weakness;Acute pain   PT Problem List Decreased range of motion;Decreased strength;Decreased activity tolerance;Decreased safety awareness;Decreased mobility;Pain  PT Treatment Interventions Gait training;Functional mobility training;Therapeutic activities;Therapeutic exercise;Patient/family education;Manual techniques;Neuromuscular re-education;Balance training   PT Goals (Current goals can be found in the Care Plan section) Acute Rehab PT Goals Patient Stated Goal: decrease pain with movement, "help me" move PT Goal Formulation: With patient Time For Goal Achievement:  12/18/13 Potential to Achieve Goals: Fair    Frequency Min 3X/week    End of Session Equipment Utilized During Treatment: Oxygen Activity Tolerance: Patient limited by pain Patient left: in bed;with call bell/phone within reach;with bed alarm set Nurse Communication: Mobility status         Time: [id number] PT Time Calculation (min): 21 min   Charges:   PT Evaluation $Initial PT Evaluation Tier I: 1 Procedure     [name] 12/04/2013, 9:43 AM

## 2013-12-04 NOTE — Progress Notes (Signed)
Patient ID: [name], male   DOB: 01/04/1966, 48 y.o.   MRN: [id number]  [place] A. Merlene Laughter, MD     [email or web address]          [name] is an 48 y.o. male.   Assessment/Plan:    Subacute gait disorder of unclear etiology. However, the exam suggests cervical myelopathy with brisk reflexes and upgoing toes. 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 This is the most likely culprit for the patient's spasticity, leg pain and weakness. Consequently, the patient be given high-dose steroids. The patient should be setup for neurosurgical opinion regarding this lesion. The patient's spasticity is also severe limiting factor. The dose of Valium will be increased.  Primary Patient does have an elevated CPK which is likely due to rhabdomyolysis as opposed to a myopathy. Hypersomnia suspected due to sleep apnea possibly medication effect. The patient has been placed on positive pressure.   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 He continues reports severe pain involving the legs. He continues to have contractures of the legs. Appears that the patient did have an attempt at an MRI or using the emergency room per Dr. Maralyn Sago physician's notes. However, he could not get this done because of the contractures.  The patient has refused his EEG.   The myelogram is reviewed in person. There is multilevel disc ossified complex and facet hypertrophy. The most likely finding however he is severe canal stenosis with encroachment on the spinal cord at C3-C4. At the most severe point of encroachment, there is almost complete obliteration of the spinal fluid. This therefore it is the most likely exhalation for the patient myelopathic symptoms.   GENERAL: Pleasant man who seems to be in significant pain involving the legs.  HEENT: Supple. There are  significant bruises noted that the nasal bridge and for head.  ABDOMEN: soft  EXTREMITIES: No edema  BACK: Normal.  SKIN: Normal by inspection.  MENTAL STATUS: Alert and oriented To hospital month and year. He also is oriented to his overall medical condition. Speech, language and cognition are generally Unremarkable. Judgment and insight Fair.  CRANIAL NERVES: Pupils are equal, round and reactive to light and accommodation; extra ocular movements are full, there is no significant nystagmus; visual fields are full; upper and lower facial muscles are normal in strength and symmetric, there is no flattening of the nasolabial folds; tongue is midline; uvula is midline; shoulder elevation is normal.  MOTOR: Normal tone, bulk and strength- In the upper extremities; no pronator drift. He has at least antigravity strength in the legs but there appears to be contracture at the knees which is new per the patient. He is complaining of severe pain in the legs. There appears to be increased tone in the legs. He has significantly reduced contracture on the right but they're still contracture and flexure spasm of the left lower extremity. COORDINATION: Left finger to nose is normal, right finger to nose is normal, No rest tremor; no intention tremor; no postural tremor; no bradykinesia.  REFLEXES: Deep tendon reflexes are symmetrical But pathologically brisk throughout including the upper extremities. Babinski reflexes are Extensor bilaterally.  SENSATION: Normal to light touch.        Objective: Vital signs in last 24 hours: Temp:  [98.3 F (36.8 C)-98.5 F (36.9 C)] 98.5 F (36.9 C) (07/29 1430) Pulse Rate:  [100-110] 100 (  07/29 1430) Resp:  [20] 20 (07/29 1430) BP: (116-128)/(63-68) 122/68 mmHg (07/29 1430) SpO2:  [97 %-100 %] 97 % (07/29 1430) Weight:  [85.367 kg (188 lb 3.2 oz)] 85.367 kg (188 lb 3.2 oz) (07/29 0418)  Intake/Output from previous day: 07/28 0701 - 07/29 0700 In: 2226.3  [P.O.:720; I.V.:1506.3] Out: 950 [Urine:950] Intake/Output this shift:   Nutritional status: Full Liquid   Lab Results: Results for orders placed during the hospital encounter of 12/02/13 (from the past 48 hour(s))  TSH     Status: None   Collection Time    12/02/13 10:49 PM      Result Value Ref Range   TSH 2.120  0.350 - 4.500 uIU/mL   Comment: Performed at [facility] PANEL     Status: Abnormal   Collection Time    12/03/13  5:43 AM      Result Value Ref Range   Sodium 150 (*) 137 - 147 mEq/L   Potassium 4.7  3.7 - 5.3 mEq/L   Chloride 116 (*) 96 - 112 mEq/L   CO2 24  19 - 32 mEq/L   Glucose, Bld 72  70 - 99 mg/dL   BUN 19  6 - 23 mg/dL   Creatinine, Ser 1.21  0.50 - 1.35 mg/dL   Calcium 8.8  8.4 - 10.5 mg/dL   Total Protein 6.4  6.0 - 8.3 g/dL   Albumin 3.1 (*) 3.5 - 5.2 g/dL   AST 66 (*) 0 - 37 U/L   ALT 41  0 - 53 U/L   Alkaline Phosphatase 106  39 - 117 U/L   Total Bilirubin 0.3  0.3 - 1.2 mg/dL   GFR calc non Af Amer 70 (*) >90 mL/min   GFR calc Af Amer 81 (*) >90 mL/min   Comment: (NOTE)     The eGFR has been calculated using the CKD EPI equation.     This calculation has not been validated in all clinical situations.     eGFR's persistently <90 mL/min signify possible Chronic Kidney     Disease.   Anion gap 10  5 - 15  CBC     Status: Abnormal   Collection Time    12/03/13  5:43 AM      Result Value Ref Range   WBC 9.4  4.0 - 10.5 K/uL   RBC 3.64 (*) 4.22 - 5.81 MIL/uL   Hemoglobin 11.8 (*) 13.0 - 17.0 g/dL   HCT 37.0 (*) 39.0 - 52.0 %   MCV 101.6 (*) 78.0 - 100.0 fL   MCH 32.4  26.0 - 34.0 pg   MCHC 31.9  30.0 - 36.0 g/dL   RDW 14.9  11.5 - 15.5 %   Platelets 217  150 - 400 K/uL  VITAMIN B12     Status: None   Collection Time    12/03/13  5:43 AM      Result Value Ref Range   Vitamin B-12 327  211 - 911 pg/mL   Comment: Performed at [facility]     Status: None   Collection Time     12/03/13  5:43 AM      Result Value Ref Range   Homocysteine 11.7  4.0 - 15.4 umol/L   Comment: Performed at [facility] CKMB     Status: None   Collection Time    12/03/13  5:43 AM      Result Value  Ref Range   Total CK 12  7 - 232 U/L   CK, MB <1.0  0.3 - 4.0 ng/mL   Relative Index NOT CALCULATED  0.0 - 2.5   Comment: Performed at [facility]  ANA     Status: None   Collection Time    12/03/13  5:43 AM      Result Value Ref Range   ANA NEGATIVE  NEGATIVE   Comment: Performed at [place]     Status: Abnormal   Collection Time    12/03/13  5:43 AM      Result Value Ref Range   Sed Rate 37 (*) 0 - 16 mm/hr  GLUCOSE, CAPILLARY     Status: None   Collection Time    12/03/13 10:00 PM      Result Value Ref Range   Glucose-Capillary 81  70 - 99 mg/dL  BASIC METABOLIC PANEL     Status: Abnormal   Collection Time    12/04/13  7:17 AM      Result Value Ref Range   Sodium 147  137 - 147 mEq/L   Potassium 5.1  3.7 - 5.3 mEq/L   Chloride 113 (*) 96 - 112 mEq/L   CO2 25  19 - 32 mEq/L   Glucose, Bld 90  70 - 99 mg/dL   BUN 16  6 - 23 mg/dL   Creatinine, Ser 1.13  0.50 - 1.35 mg/dL   Calcium 8.8  8.4 - 10.5 mg/dL   GFR calc non Af Amer 76 (*) >90 mL/min   GFR calc Af Amer 88 (*) >90 mL/min   Comment: (NOTE)     The eGFR has been calculated using the CKD EPI equation.     This calculation has not been validated in all clinical situations.     eGFR's persistently <90 mL/min signify possible Chronic Kidney     Disease.   Anion gap 9  5 - 15  CSF CELL COUNT WITH DIFFERENTIAL     Status: Abnormal   Collection Time    12/04/13  1:00 PM      Result Value Ref Range   Tube # 3     Color, CSF COLORLESS  COLORLESS   Appearance, CSF CLEAR  CLEAR   Supernatant NOT INDICATED     RBC Count, CSF 4 (*) 0 /cu mm   WBC, CSF 2  0 - 5 /cu mm   Segmented Neutrophils-CSF TOO FEW TO COUNT, SMEAR AVAILABLE FOR REVIEW  0 - 6 %    GLUCOSE, CSF     Status: None   Collection Time    12/04/13  1:00 PM      Result Value Ref Range   Glucose, CSF 74  43 - 76 mg/dL  PROTEIN, CSF     Status: Abnormal   Collection Time    12/04/13  1:00 PM      Result Value Ref Range   Total  Protein, CSF 52 (*) 15 - 45 mg/dL    Lipid Panel No results found for this basename: CHOL, TRIG, HDL, CHOLHDL, VLDL, LDLCALC,  in the last 72 hours  Studies/Results: Ct Cervical Spine W Contrast  12/04/2013   CLINICAL DATA:  Cervical myelopathy. Lower extremity weakness. Spasticity.  EXAM: CT MYELOGRAPHY CERVICAL AND THORACIC SPINE  TECHNIQUE: CT imaging of the cervical and thoracic spine was performed after intrathecal contrast administration. Multiplanar CT image reconstructions were also generated.  COMPARISON:  CT cervical  and lumbar spine 12/02/2013  FINDINGS: CT myelogram cervical spine:  Lumbar puncture and injection of contrast by Dr. Thornton Papas. The patient was not able to get into the magnet for MRI.  Mild retrolisthesis at C3-4 secondary to disc and facet degeneration. Negative for fracture or mass. No bony destruction.  C2-3:  Small central disc protrusion without cord deformity  C3-4: Marked disc degeneration with disc space narrowing endplate sclerosis and endplate cystic changes. Cervical spondylosis with diffuse uncinate spurring. Bilateral facet hypertrophy. There is moderate spinal stenosis with flattening of the cord. There is foraminal encroachment bilaterally secondary to spurring.  C4-5: Diffuse disc degeneration with a left paracentral disc protrusion causing mild flattening of the cord. Mild spinal stenosis. Neural foramina widely patent.  C5-6:  Small central disc protrusion with mild spinal stenosis noted  C6-7: Small central disc protrusion without significant spinal stenosis  C7-T1:  Negative  CT myelogram thoracic spine:  Negative for fracture or mass. No cord compression. Spinal cord morphology is normal and no mass lesion is identified  in the cord.  Diffuse thoracic disc degeneration with disc space narrowing in the mid and lower thoracic spine. This is most severe at T12-L1 and L1-2 were there is marked disc space narrowing, endplate sclerosis and cystic change. There is mild osteophyte at T12-L1 and L1-2 without significant spinal stenosis or cord deformity. No disc protrusion identified.  IMPRESSION: Cervical spondylosis most severe at C3-4 where there is moderate spinal stenosis and foraminal encroachment bilaterally. There is a left paracentral disc protrusion at C4-5 with mild spinal stenosis. Mild spinal stenosis at C5-6 also noted.  Thoracic degenerative changes most prominent at T12-L1 and L1-2. No cord compression or significant thoracic spinal stenosis.   Electronically Signed   By: Franchot Gallo M.D.   On: 12/04/2013 14:19   Ct Thoracic Spine W Contrast  12/04/2013   CLINICAL DATA:  Cervical myelopathy. Lower extremity weakness. Spasticity.  EXAM: CT MYELOGRAPHY CERVICAL AND THORACIC SPINE  TECHNIQUE: CT imaging of the cervical and thoracic spine was performed after intrathecal contrast administration. Multiplanar CT image reconstructions were also generated.  COMPARISON:  CT cervical and lumbar spine 12/02/2013  FINDINGS: CT myelogram cervical spine:  Lumbar puncture and injection of contrast by Dr. Thornton Papas. The patient was not able to get into the magnet for MRI.  Mild retrolisthesis at C3-4 secondary to disc and facet degeneration. Negative for fracture or mass. No bony destruction.  C2-3:  Small central disc protrusion without cord deformity  C3-4: Marked disc degeneration with disc space narrowing endplate sclerosis and endplate cystic changes. Cervical spondylosis with diffuse uncinate spurring. Bilateral facet hypertrophy. There is moderate spinal stenosis with flattening of the cord. There is foraminal encroachment bilaterally secondary to spurring.  C4-5: Diffuse disc degeneration with a left paracentral disc protrusion  causing mild flattening of the cord. Mild spinal stenosis. Neural foramina widely patent.  C5-6:  Small central disc protrusion with mild spinal stenosis noted  C6-7: Small central disc protrusion without significant spinal stenosis  C7-T1:  Negative  CT myelogram thoracic spine:  Negative for fracture or mass. No cord compression. Spinal cord morphology is normal and no mass lesion is identified in the cord.  Diffuse thoracic disc degeneration with disc space narrowing in the mid and lower thoracic spine. This is most severe at T12-L1 and L1-2 were there is marked disc space narrowing, endplate sclerosis and cystic change. There is mild osteophyte at T12-L1 and L1-2 without significant spinal stenosis or cord deformity. No disc  protrusion identified.  IMPRESSION: Cervical spondylosis most severe at C3-4 where there is moderate spinal stenosis and foraminal encroachment bilaterally. There is a left paracentral disc protrusion at C4-5 with mild spinal stenosis. Mild spinal stenosis at C5-6 also noted.  Thoracic degenerative changes most prominent at T12-L1 and L1-2. No cord compression or significant thoracic spinal stenosis.   Electronically Signed   By: Franchot Gallo M.D.   On: 12/04/2013 14:19    Medications:  Scheduled Meds: . budesonide  0.25 mg Nebulization BID  . diazepam  10 mg Oral QID  . methylPREDNISolone (SOLU-MEDROL) injection  1,000 mg Intravenous Daily  . senna  1 tablet Oral QHS   Continuous Infusions: . sodium chloride 75 mL/hr at 12/04/13 1623   PRN Meds:.acetaminophen, acetaminophen, albuterol, alum & mag hydroxide-simeth, guaiFENesin-dextromethorphan, HYDROcodone-acetaminophen, morphine injection, ondansetron (ZOFRAN) IV, ondansetron     LOS: 2 days   Janea Schwenn A. Merlene Laughter, M.D.  Diplomate, Tax adviser of Psychiatry and Neurology ( Neurology).

## 2013-12-04 NOTE — Progress Notes (Signed)
[facility] PROGRESS NOTE  [name] ZOX:[id number] DOB: 1965/08/21 DOA: 12/02/2013 PCP: Default, Provider, MD    Code Status: Full code Family Communication: Family not available; discussed with patient. Disposition Plan: Patient will likely need skilled nursing facility placement upon discharge in a few days.   Consultants:  Neurology  Procedures:  None  Antibiotics:  None  HPI/Subjective: The patient is laying in bed. Reports that it is too painful to straighten his legs  Objective: Filed Vitals:   12/04/13 1430  BP: 122/68  Pulse: 100  Temp: 98.5 F (36.9 C)  Resp: 20    Intake/Output Summary (Last 24 hours) at 12/04/13 1547 Last data filed at 12/03/13 1900  Gross per 24 hour  Intake 746.25 ml  Output    950 ml  Net -203.75 ml   Filed Weights   12/02/13 1526 12/03/13 0300 12/04/13 0418  Weight: 86.773 kg (191 lb 4.8 oz) 86.7 kg (191 lb 2.2 oz) 85.367 kg (188 lb 3.2 oz)    Exam:   General:  48 year-old man laying in bed with his legs flexed, in no acute distress.  Cardiovascular: S1, S2, with no murmurs rubs or gallops.  Respiratory: Occasional crackles noted mid lobes. Breathing is nonlabored.  Abdomen: Positive bowel sounds, soft, nontender, nondistended.  Skin: Stable abrasions on his frontal scalp, for head, and bridge of nose. Also a few excoriations on his legs bilaterally without drainage or significant erythema.  Musculoskeletal/extremities: Trace of pedal edema. Moderate tenderness over palpation of his quadriceps muscles right greater than left. The patient resists having both legs extended.  Neurologic: He is alert and oriented x3. Cranial nerves II through XII are intact. Plantar reflexes with upgoing toes upon new examination. Upper extremity strength and sensation intact with strength 5 over 5. The patient resists testing of his lower extremities.   Data Reviewed: Basic Metabolic Panel:  Recent Labs Lab 11/30/13 1250  12/02/13 1234 12/03/13 0543 12/04/13 0717  NA 148* 150* 150* 147  K 4.4 4.4 4.7 5.1  CL 112 114* 116* 113*  CO2 26 26 24 25   GLUCOSE 143* 92 72 90  BUN 24* 23 19 16   CREATININE 1.28 1.35 1.21 1.13  CALCIUM 9.2 8.9 8.8 8.8   Liver Function Tests:  Recent Labs Lab 11/30/13 1250 12/03/13 0543  AST 39* 66*  ALT 29 41  ALKPHOS 117 106  BILITOT <0.2* 0.3  PROT 7.2 6.4  ALBUMIN 3.5 3.1*   No results found for this basename: LIPASE, AMYLASE,  in the last 168 hours No results found for this basename: AMMONIA,  in the last 168 hours CBC:  Recent Labs Lab 11/30/13 1250 12/02/13 1234 12/03/13 0543  WBC 10.0 11.1* 9.4  NEUTROABS 6.9 7.9*  --   HGB 12.6* 11.7* 11.8*  HCT 38.7* 37.0* 37.0*  MCV 99.0 100.0 101.6*  PLT 238 237 217   Cardiac Enzymes:  Recent Labs Lab 12/02/13 1234 12/03/13 0543  CKTOTAL 2997* 12  CKMB  --  <1.0   BNP (last 3 results) No results found for this basename: PROBNP,  in the last 8760 hours CBG:  Recent Labs Lab 12/03/13 2200  GLUCAP 81    No results found for this or any previous visit (from the past 240 hour(s)).   Studies: Ct Cervical Spine Wo Contrast  12/02/2013   CLINICAL DATA:  Fall.  Evaluate for injury.  EXAM: CT CERVICAL, THORACIC, AND LUMBAR SPINE WITHOUT CONTRAST  TECHNIQUE: Multidetector CT imaging of the cervical, thoracic and  lumbar spine was performed without intravenous contrast. Multiplanar CT image reconstructions were also generated.  COMPARISON:  CT cervical spine 08/08/2013 and CT chest 09/19/2012. CT pelvis 11/29/2013.  FINDINGS: CT CERVICAL SPINE FINDINGS  There is straightening of the normal cervical lordosis. Slight flattening of the C5 superior endplate is new from 08/08/2013. Vertebral body height is otherwise maintained and unchanged. Advanced degenerative disc disease at C3-4 with marked loss of disc space height. Neural foramina appear widely patent. Visualized portions of the intracranial contents show no acute  findings. Soft tissues are unremarkable.  CT THORACIC SPINE FINDINGS  Alignment is anatomic, including at the cervicothoracic junction. Vertebral body height is maintained. Scattered endplate degenerative changes. Visualized portions of the ribs show no fracture. Visualized portions of the lungs shows scattered subsegmental atelectasis or scarring. No pleural fluid.  CT LUMBAR SPINE FINDINGS  Straightening of the normal lumbar lordosis. Approximately 12 mm anterolisthesis of L5 on S1 with severe degenerative disc disease and obliteration of the joint space, as on 11/29/2013. There are chronic bilateral L5 pars defects. Vertebral body height and alignment are otherwise maintained. Endplate degenerative changes and prominent Schmorl's nodes are seen of the thoracolumbar junction as well as at L1-2 and L2-3.  Visualized portions of the kidneys and retroperitoneum show no acute findings. Bladder is distended.  IMPRESSION: 1. C5 superior endplate appears slightly compressed when compared with 08/08/2013. Otherwise, no evidence of an acute fracture or subluxation. 2. Multilevel spondylosis. 3. Grade 2 anterolisthesis of L5 on S1, secondary to chronic bilateral pars defects, with advanced secondary degenerative disc disease.   Electronically Signed   By: Leanna Battles M.D.   On: 12/02/2013 17:46   Ct Cervical Spine W Contrast  12/04/2013   CLINICAL DATA:  Cervical myelopathy. Lower extremity weakness. Spasticity.  EXAM: CT MYELOGRAPHY CERVICAL AND THORACIC SPINE  TECHNIQUE: CT imaging of the cervical and thoracic spine was performed after intrathecal contrast administration. Multiplanar CT image reconstructions were also generated.  COMPARISON:  CT cervical and lumbar spine 12/02/2013  FINDINGS: CT myelogram cervical spine:  Lumbar puncture and injection of contrast by Dr. Tyron Russell. The patient was not able to get into the magnet for MRI.  Mild retrolisthesis at C3-4 secondary to disc and facet degeneration. Negative for  fracture or mass. No bony destruction.  C2-3:  Small central disc protrusion without cord deformity  C3-4: Marked disc degeneration with disc space narrowing endplate sclerosis and endplate cystic changes. Cervical spondylosis with diffuse uncinate spurring. Bilateral facet hypertrophy. There is moderate spinal stenosis with flattening of the cord. There is foraminal encroachment bilaterally secondary to spurring.  C4-5: Diffuse disc degeneration with a left paracentral disc protrusion causing mild flattening of the cord. Mild spinal stenosis. Neural foramina widely patent.  C5-6:  Small central disc protrusion with mild spinal stenosis noted  C6-7: Small central disc protrusion without significant spinal stenosis  C7-T1:  Negative  CT myelogram thoracic spine:  Negative for fracture or mass. No cord compression. Spinal cord morphology is normal and no mass lesion is identified in the cord.  Diffuse thoracic disc degeneration with disc space narrowing in the mid and lower thoracic spine. This is most severe at T12-L1 and L1-2 were there is marked disc space narrowing, endplate sclerosis and cystic change. There is mild osteophyte at T12-L1 and L1-2 without significant spinal stenosis or cord deformity. No disc protrusion identified.  IMPRESSION: Cervical spondylosis most severe at C3-4 where there is moderate spinal stenosis and foraminal encroachment bilaterally. There is a left  paracentral disc protrusion at C4-5 with mild spinal stenosis. Mild spinal stenosis at C5-6 also noted.  Thoracic degenerative changes most prominent at T12-L1 and L1-2. No cord compression or significant thoracic spinal stenosis.   Electronically Signed   By: Marlan Palau M.D.   On: 12/04/2013 14:19   Ct Thoracic Spine Wo Contrast  12/02/2013   CLINICAL DATA:  Fall.  Evaluate for injury.  EXAM: CT CERVICAL, THORACIC, AND LUMBAR SPINE WITHOUT CONTRAST  TECHNIQUE: Multidetector CT imaging of the cervical, thoracic and lumbar spine was  performed without intravenous contrast. Multiplanar CT image reconstructions were also generated.  COMPARISON:  CT cervical spine 08/08/2013 and CT chest 09/19/2012. CT pelvis 11/29/2013.  FINDINGS: CT CERVICAL SPINE FINDINGS  There is straightening of the normal cervical lordosis. Slight flattening of the C5 superior endplate is new from 08/08/2013. Vertebral body height is otherwise maintained and unchanged. Advanced degenerative disc disease at C3-4 with marked loss of disc space height. Neural foramina appear widely patent. Visualized portions of the intracranial contents show no acute findings. Soft tissues are unremarkable.  CT THORACIC SPINE FINDINGS  Alignment is anatomic, including at the cervicothoracic junction. Vertebral body height is maintained. Scattered endplate degenerative changes. Visualized portions of the ribs show no fracture. Visualized portions of the lungs shows scattered subsegmental atelectasis or scarring. No pleural fluid.  CT LUMBAR SPINE FINDINGS  Straightening of the normal lumbar lordosis. Approximately 12 mm anterolisthesis of L5 on S1 with severe degenerative disc disease and obliteration of the joint space, as on 11/29/2013. There are chronic bilateral L5 pars defects. Vertebral body height and alignment are otherwise maintained. Endplate degenerative changes and prominent Schmorl's nodes are seen of the thoracolumbar junction as well as at L1-2 and L2-3.  Visualized portions of the kidneys and retroperitoneum show no acute findings. Bladder is distended.  IMPRESSION: 1. C5 superior endplate appears slightly compressed when compared with 08/08/2013. Otherwise, no evidence of an acute fracture or subluxation. 2. Multilevel spondylosis. 3. Grade 2 anterolisthesis of L5 on S1, secondary to chronic bilateral pars defects, with advanced secondary degenerative disc disease.   Electronically Signed   By: Leanna Battles M.D.   On: 12/02/2013 17:46   Ct Thoracic Spine W  Contrast  12/04/2013   CLINICAL DATA:  Cervical myelopathy. Lower extremity weakness. Spasticity.  EXAM: CT MYELOGRAPHY CERVICAL AND THORACIC SPINE  TECHNIQUE: CT imaging of the cervical and thoracic spine was performed after intrathecal contrast administration. Multiplanar CT image reconstructions were also generated.  COMPARISON:  CT cervical and lumbar spine 12/02/2013  FINDINGS: CT myelogram cervical spine:  Lumbar puncture and injection of contrast by Dr. Tyron Russell. The patient was not able to get into the magnet for MRI.  Mild retrolisthesis at C3-4 secondary to disc and facet degeneration. Negative for fracture or mass. No bony destruction.  C2-3:  Small central disc protrusion without cord deformity  C3-4: Marked disc degeneration with disc space narrowing endplate sclerosis and endplate cystic changes. Cervical spondylosis with diffuse uncinate spurring. Bilateral facet hypertrophy. There is moderate spinal stenosis with flattening of the cord. There is foraminal encroachment bilaterally secondary to spurring.  C4-5: Diffuse disc degeneration with a left paracentral disc protrusion causing mild flattening of the cord. Mild spinal stenosis. Neural foramina widely patent.  C5-6:  Small central disc protrusion with mild spinal stenosis noted  C6-7: Small central disc protrusion without significant spinal stenosis  C7-T1:  Negative  CT myelogram thoracic spine:  Negative for fracture or mass. No cord compression. Spinal cord  morphology is normal and no mass lesion is identified in the cord.  Diffuse thoracic disc degeneration with disc space narrowing in the mid and lower thoracic spine. This is most severe at T12-L1 and L1-2 were there is marked disc space narrowing, endplate sclerosis and cystic change. There is mild osteophyte at T12-L1 and L1-2 without significant spinal stenosis or cord deformity. No disc protrusion identified.  IMPRESSION: Cervical spondylosis most severe at C3-4 where there is moderate  spinal stenosis and foraminal encroachment bilaterally. There is a left paracentral disc protrusion at C4-5 with mild spinal stenosis. Mild spinal stenosis at C5-6 also noted.  Thoracic degenerative changes most prominent at T12-L1 and L1-2. No cord compression or significant thoracic spinal stenosis.   Electronically Signed   By: Marlan Palau M.D.   On: 12/04/2013 14:19   Ct Lumbar Spine Wo Contrast  12/02/2013   CLINICAL DATA:  Fall.  Evaluate for injury.  EXAM: CT CERVICAL, THORACIC, AND LUMBAR SPINE WITHOUT CONTRAST  TECHNIQUE: Multidetector CT imaging of the cervical, thoracic and lumbar spine was performed without intravenous contrast. Multiplanar CT image reconstructions were also generated.  COMPARISON:  CT cervical spine 08/08/2013 and CT chest 09/19/2012. CT pelvis 11/29/2013.  FINDINGS: CT CERVICAL SPINE FINDINGS  There is straightening of the normal cervical lordosis. Slight flattening of the C5 superior endplate is new from 08/08/2013. Vertebral body height is otherwise maintained and unchanged. Advanced degenerative disc disease at C3-4 with marked loss of disc space height. Neural foramina appear widely patent. Visualized portions of the intracranial contents show no acute findings. Soft tissues are unremarkable.  CT THORACIC SPINE FINDINGS  Alignment is anatomic, including at the cervicothoracic junction. Vertebral body height is maintained. Scattered endplate degenerative changes. Visualized portions of the ribs show no fracture. Visualized portions of the lungs shows scattered subsegmental atelectasis or scarring. No pleural fluid.  CT LUMBAR SPINE FINDINGS  Straightening of the normal lumbar lordosis. Approximately 12 mm anterolisthesis of L5 on S1 with severe degenerative disc disease and obliteration of the joint space, as on 11/29/2013. There are chronic bilateral L5 pars defects. Vertebral body height and alignment are otherwise maintained. Endplate degenerative changes and prominent  Schmorl's nodes are seen of the thoracolumbar junction as well as at L1-2 and L2-3.  Visualized portions of the kidneys and retroperitoneum show no acute findings. Bladder is distended.  IMPRESSION: 1. C5 superior endplate appears slightly compressed when compared with 08/08/2013. Otherwise, no evidence of an acute fracture or subluxation. 2. Multilevel spondylosis. 3. Grade 2 anterolisthesis of L5 on S1, secondary to chronic bilateral pars defects, with advanced secondary degenerative disc disease.   Electronically Signed   By: Leanna Battles M.D.   On: 12/02/2013 17:46    Scheduled Meds: . budesonide  0.25 mg Nebulization BID  . diazepam  10 mg Oral BID  . senna  1 tablet Oral QHS   Continuous Infusions: . 0.45 % NaCl with KCl 20 mEq / L 125 mL/hr at 12/03/13 1457   Assessment and plan:  Principal Problem:   Multiple falls Active Problems:   OSA (obstructive sleep apnea)   Dehydration with hypernatremia   Rhabdomyolysis   Bilateral leg pain   Skin abrasion   COPD with chronic bronchitis   Tobacco abuse   Degenerative joint disease of spine   Lumbosacral spondylosis without myelopathy   Cervical spondylosis with myelopathy    Multiple falls with gait disorder After multiple falls at the assisted living, the patient has been relegated to a wheelchair  because of severe pain of his lower extremities. He attributes the falls to obstructive sleep apnea. He was unable to tolerate the MRI in the ED, so CT of his spine was ordered. The results were significant for severe degenerative changes with spondylosis throughout the spine. There was no indication of cord impingement or compromise. CT of his head was nonacute on admission. Dr. Gerilyn Pilgrimoonquah was consulted and believed that the patient has a cervical myelopathy until proven otherwise. MRI of the cervical spine was ordered, but patient was not able to comply and lie in an acceptable position. In place of an MRI, Neurology has ordered CT  myelogram of cervical and thoracic spine. Blood work has been unremarkable and patient refused EEG. PT has recommended SNF placement.  Will follow with neurology regarding further management.  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 As above.   Bilateral leg pain.  As with Dr. Gerilyn Pilgrimoonquah, there is concern that the patient may have a spinal cord injury. He is unable to or refuses to extend his legs bilaterally. He has exquisite pain in his proximal legs when I attempted to extend them. This appears to be worse on the right. He does not complain of neck pain or lower back pain in particular. Of note, the patient had been ambulating with a walker just last week. The other possibility is that he could have a tendon tear or muscle rupture. We will treat his pain with as needed analgesics, but with caution as the patient is very sensitive to large doses of opiates.   Mild rhabdomyolysis, secondary to falls.  I do not believe the rhabdomyolysis is attributing to his symptomatology. CK level normalized. Will decreased IV fluids.   Dehydration with hypernatremia. The patient has a history of dehydration/hypernatremia in May of 2014. He denies vomiting or diarrhea. Half-normal saline was started on admission with improvement in his serum sodium.    Obstructive sleep apnea.  The patient was diagnosed last year. He says that he is supposed to be on CPAP, but apparently it has not been ordered. CPAP was ordered.   COPD with tobacco abuse.  Currently stable. We'll continue his chronic inhalers. The patient was strongly advised to stop smoking. He declines a nicotine patch  Mild leukocytosis. Likely secondary to stress reaction. Now within normal limits following hydration. No evidence of active infection.   Mild anemia. His anemia panel was virtually within normal limits last year. We'll continue to follow. Vitamin B12 and TSH are in normal range.  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    Time spent: 35 minutes.    MEMON,JEHANZEB  [facility] Pager [phone] If 7PM-7AM, please contact night-coverage at [email or web address], password [email or web address] 12/04/2013, 3:47 PM  LOS: 2 days

## 2013-12-04 NOTE — Clinical Social Work Note (Addendum)
CSW presented bed offers to pt's sister, Efraim KaufmannMelissa who [facility]. Facility notified and were made aware of 30 day pasarr. Awaiting stability for d/c. With Melissa's permission, CSW also notified [facility] of need for higher level of care and [facility]. CSW to continue to follow.  Derenda FennelKara Lovina Zuver, [place] [phone]

## 2013-12-05 ENCOUNTER — Other Ambulatory Visit (HOSPITAL_COMMUNITY): Payer: Self-pay

## 2013-12-05 DIAGNOSIS — G4733 Obstructive sleep apnea (adult) (pediatric): Secondary | ICD-10-CM

## 2013-12-05 LAB — BASIC METABOLIC PANEL
Anion gap: 10 (ref 5–15)
BUN: 21 mg/dL (ref 6–23)
CALCIUM: 9 mg/dL (ref 8.4–10.5)
CO2: 24 meq/L (ref 19–32)
Chloride: 114 mEq/L — ABNORMAL HIGH (ref 96–112)
Creatinine, Ser: 1.19 mg/dL (ref 0.50–1.35)
GFR calc Af Amer: 83 mL/min — ABNORMAL LOW (ref >90)
GFR calc non Af Amer: 71 mL/min — ABNORMAL LOW (ref >90)
Glucose, Bld: 157 mg/dL — ABNORMAL HIGH (ref 70–99)
POTASSIUM: 5.5 meq/L — AB (ref 3.7–5.3)
Sodium: 148 mEq/L — ABNORMAL HIGH (ref 137–147)

## 2013-12-05 MED ORDER — HEPARIN SODIUM (PORCINE) 5000 UNIT/ML IJ SOLN
5000.0000 [IU] | Freq: Three times a day (TID) | INTRAMUSCULAR | Status: DC
  Administered 2013-12-05 – 2013-12-11 (×16): 5000 [IU] via SUBCUTANEOUS
  Filled 2013-12-05 (×17): qty 1

## 2013-12-05 MED ORDER — DEXTROSE 5 % IV SOLN
INTRAVENOUS | Status: DC
  Administered 2013-12-05 – 2013-12-06 (×2): via INTRAVENOUS
  Administered 2013-12-06: 1000 mL via INTRAVENOUS
  Administered 2013-12-07 (×2): via INTRAVENOUS

## 2013-12-05 MED ORDER — DEXTROSE 5 % IV SOLN: INTRAVENOUS | Status: DC

## 2013-12-05 NOTE — Progress Notes (Addendum)
[facility] PROGRESS NOTE  [name] LKG:[id number] DOB: 1966/01/25 DOA: 12/02/2013 PCP: Default, Provider, MD  Summary: This is a 48 year old gentleman with mild developmental delay who is a resident of [facility]. Up until 2 weeks ago, Patient was apparently walking with the assistance of a walker. He began developing severe pain in his thighs bilaterally. This was associated with weakness and spasticity in his legs. The patient was repeatedly falling and was subsequently required a wheelchair to get around. He had several falls from a wheelchair which she attributes to falling asleep and falling out of the chair. He has obstructive sleep apnea and has been somewhat noncompliant with his CPAP. When his pain continued to get worse, as his weakness he presents to the emergency room for evaluation. Initial imaging with noncontrast CTs of the spine did not show any acute findings. He was seen by neurology who ordered CT myelogram of the cervical thoracic spine. It was noted that patient had severe canal stenosis with encroachment on the spinal cord at C3-C4. Neurology felt this was likely culprit of the patient's spasticity, leg pain and weakness. We will start empirically on IV steroids. Recommendations were for neurosurgical evaluation. I discussed the case with Dr. Conchita Paris, on call for neurosurgery who has agreed to see the patient in transfer. I also discussed the case with Dr. Jerral Ralph who is accepting the patient in transfer to [facility]. Of note, patient also had some mild rhabdomyolysis with elevated CK levels. These have improved with IV fluids and his urine output has been adequate. He does have some hypernatremia, likely related to decreased by mouth intake. His fluids have been adjusted to dextrose infusion. Patient is also somewhat tachycardic, but this is likely reactive to his underlying pain. When he is ready for discharge to [facility], plan will be to discharge to [facility]  nursing facility.  Assessment and plan:  Multiple falls with gait disorder, possibly related to cervical myelopathy After multiple falls at the assisted living, the patient has been relegated to a wheelchair because of severe pain of his lower extremities. He attributes the falls to obstructive sleep apnea. He was unable to tolerate the MRI on admission, so CT of his spine was ordered. The results were significant for severe degenerative changes with spondylosis throughout the spine. There was no indication of cord impingement or compromise on initial noncontrast CT. CT of his head was nonacute on admission. Dr. Gerilyn Pilgrim was consulted and believed that the patient has a cervical myelopathy until proven otherwise. MRI of the cervical spine was ordered, but patient was not able to comply and lie in an acceptable position. In place of an MRI, CT myelogram was ordered by neurology. Findings indicated severe canal stenosis with encroachment on spinal cord at C3-C4. Recommendations by neurology were for transfer to [facility], for neurosurgical evaluation. He was started empirically on steroids. Blood work has been unremarkable and patient refused EEG. PT has recommended SNF placement.    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 As above.   Bilateral leg pain.  Discussed with Dr. Gerilyn Pilgrim, there is concern that the patient may have a spinal cord injury. He is unable to/refuses to extend his legs bilaterally. He has exquisite pain in his proximal legs when I attempted to extend them and there appears to be increased tone bilaterally. This appears to be worse on the right. He does not complain of neck pain or lower back pain in particular. Deep tendon reflexes are  brisk bilaterally and Babinski's are upgoing bilaterally. Of note, the patient had been ambulating with a walker approximately 2 weeks ago. Initially MRI was attempted, but patient was unable to lie flat. He underwent CT myelogram  of cervical thoracic spine. It was indicated that he may have a C3-C4 severe canal stenosis with encroachment on the spinal cord. Per neurology, this is felt to be the etiology of the patient's spasticity/thigh pain and weakness. He was started empirically on 1 g of Solu-Medrol daily. Case was discussed with Dr. Conchita Paris, on call for neurosurgery at [facility]. Images reviewed and it was felt that patient may benefit from a neurosurgical evaluation. Patient will be transferred to [facility] for further evaluation.   Mild rhabdomyolysis, secondary to falls. I do not believe the rhabdomyolysis is attributing to his symptomatology. CK level normalized. Will decreased IV fluids.   Dehydration with hypernatremia. The patient has a history of dehydration/hypernatremia in May of 2014. He denies vomiting or diarrhea. Half-normal saline was started on admission with some improvement, although sodium still remains elevated. He also has hyperkalemia. We will change IV fluids to dextrose infusion.    Obstructive sleep apnea.  The patient was diagnosed last year. CPAP has been ordered for him each bedtime, patient often declines.   COPD with tobacco abuse.  Currently stable. We'll continue his chronic inhalers. The patient was strongly advised to stop smoking. He declines a nicotine patch  Mild leukocytosis. Likely secondary to stress reaction. Now within normal limits following hydration. No evidence of active infection.   Mild anemia. His anemia panel was virtually within normal limits last year. We'll continue to follow. Vitamin B12 and TSH are in normal range.  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  Sinus tachycardia. Likely related to acute pain. Continue to monitor. Blood pressure stable.  Code Status: Full code Family Communication: Discussed with patient's sister over the phone. Disposition Plan: Patient will be transferred to [facility] for further  treatments   Consultants:  Neurology  Procedures:  None  Antibiotics:  None  HPI/Subjective: Patient feels that Valium may be helping his pain in his legs. Continues to have pain and unable to straighten legs.  Objective: Filed Vitals:   12/05/13 1504  BP: 120/73  Pulse: 120  Temp: 98.9 F (37.2 C)  Resp:     Intake/Output Summary (Last 24 hours) at 12/05/13 1507 Last data filed at 12/05/13 0600  Gross per 24 hour  Intake 1469.25 ml  Output   1200 ml  Net 269.25 ml   Filed Weights   12/03/13 0300 12/04/13 0418 12/05/13 0421  Weight: 86.7 kg (191 lb 2.2 oz) 85.367 kg (188 lb 3.2 oz) 87.862 kg (193 lb 11.2 oz)    Exam:   General:  48 year-old man laying in bed with his legs flexed, in no acute distress.  Cardiovascular: S1, S2, with no murmurs rubs or gallops.  Respiratory: Occasional crackles noted mid lobes. Breathing is nonlabored.  Abdomen: Positive bowel sounds, soft, nontender, nondistended.  Skin: Stable abrasions on his frontal scalp, for head, and bridge of nose. Also a few excoriations on his legs bilaterally without drainage or significant erythema.  Musculoskeletal/extremities: Trace of pedal edema. Moderate tenderness over palpation of his quadriceps muscles right greater than left. The patient resists having both legs extended.  Neurologic: He is alert and oriented x3. Cranial nerves II through XII are intact. Plantar reflexes with upgoing toes upon new examination. Upper extremity strength and sensation intact with  strength 5 over 5. The patient resists testing of his lower extremities.   Data Reviewed: Basic Metabolic Panel:  Recent Labs Lab 11/30/13 1250 12/02/13 1234 12/03/13 0543 12/04/13 0717 12/05/13 0556  NA 148* 150* 150* 147 148*  K 4.4 4.4 4.7 5.1 5.5*  CL 112 114* 116* 113* 114*  CO2 26 26 24 25 24   GLUCOSE 143* 92 72 90 157*  BUN 24* 23 19 16 21   CREATININE 1.28 1.35 1.21 1.13 1.19  CALCIUM 9.2 8.9 8.8 8.8 9.0    Liver Function Tests:  Recent Labs Lab 11/30/13 1250 12/03/13 0543  AST 39* 66*  ALT 29 41  ALKPHOS 117 106  BILITOT <0.2* 0.3  PROT 7.2 6.4  ALBUMIN 3.5 3.1*   No results found for this basename: LIPASE, AMYLASE,  in the last 168 hours No results found for this basename: AMMONIA,  in the last 168 hours CBC:  Recent Labs Lab 11/30/13 1250 12/02/13 1234 12/03/13 0543  WBC 10.0 11.1* 9.4  NEUTROABS 6.9 7.9*  --   HGB 12.6* 11.7* 11.8*  HCT 38.7* 37.0* 37.0*  MCV 99.0 100.0 101.6*  PLT 238 237 217   Cardiac Enzymes:  Recent Labs Lab 12/02/13 1234 12/03/13 0543  CKTOTAL 2997* 12  CKMB  --  <1.0   BNP (last 3 results) No results found for this basename: PROBNP,  in the last 8760 hours CBG:  Recent Labs Lab 12/03/13 2200  GLUCAP 81    No results found for this or any previous visit (from the past 240 hour(s)).   Studies: Ct Cervical Spine W Contrast  12/04/2013   CLINICAL DATA:  Cervical myelopathy. Lower extremity weakness. Spasticity.  EXAM: CT MYELOGRAPHY CERVICAL AND THORACIC SPINE  TECHNIQUE: CT imaging of the cervical and thoracic spine was performed after intrathecal contrast administration. Multiplanar CT image reconstructions were also generated.  COMPARISON:  CT cervical and lumbar spine 12/02/2013  FINDINGS: CT myelogram cervical spine:  Lumbar puncture and injection of contrast by Dr. Tyron RussellBoles. The patient was not able to get into the magnet for MRI.  Mild retrolisthesis at C3-4 secondary to disc and facet degeneration. Negative for fracture or mass. No bony destruction.  C2-3:  Small central disc protrusion without cord deformity  C3-4: Marked disc degeneration with disc space narrowing endplate sclerosis and endplate cystic changes. Cervical spondylosis with diffuse uncinate spurring. Bilateral facet hypertrophy. There is moderate spinal stenosis with flattening of the cord. There is foraminal encroachment bilaterally secondary to spurring.  C4-5: Diffuse  disc degeneration with a left paracentral disc protrusion causing mild flattening of the cord. Mild spinal stenosis. Neural foramina widely patent.  C5-6:  Small central disc protrusion with mild spinal stenosis noted  C6-7: Small central disc protrusion without significant spinal stenosis  C7-T1:  Negative  CT myelogram thoracic spine:  Negative for fracture or mass. No cord compression. Spinal cord morphology is normal and no mass lesion is identified in the cord.  Diffuse thoracic disc degeneration with disc space narrowing in the mid and lower thoracic spine. This is most severe at T12-L1 and L1-2 were there is marked disc space narrowing, endplate sclerosis and cystic change. There is mild osteophyte at T12-L1 and L1-2 without significant spinal stenosis or cord deformity. No disc protrusion identified.  IMPRESSION: Cervical spondylosis most severe at C3-4 where there is moderate spinal stenosis and foraminal encroachment bilaterally. There is a left paracentral disc protrusion at C4-5 with mild spinal stenosis. Mild spinal stenosis at C5-6 also noted.  Thoracic degenerative changes most prominent at T12-L1 and L1-2. No cord compression or significant thoracic spinal stenosis.   Electronically Signed   By: Marlan Palau M.D.   On: 12/04/2013 14:19   Ct Thoracic Spine W Contrast  12/04/2013   CLINICAL DATA:  Cervical myelopathy. Lower extremity weakness. Spasticity.  EXAM: CT MYELOGRAPHY CERVICAL AND THORACIC SPINE  TECHNIQUE: CT imaging of the cervical and thoracic spine was performed after intrathecal contrast administration. Multiplanar CT image reconstructions were also generated.  COMPARISON:  CT cervical and lumbar spine 12/02/2013  FINDINGS: CT myelogram cervical spine:  Lumbar puncture and injection of contrast by Dr. Tyron Russell. The patient was not able to get into the magnet for MRI.  Mild retrolisthesis at C3-4 secondary to disc and facet degeneration. Negative for fracture or mass. No bony  destruction.  C2-3:  Small central disc protrusion without cord deformity  C3-4: Marked disc degeneration with disc space narrowing endplate sclerosis and endplate cystic changes. Cervical spondylosis with diffuse uncinate spurring. Bilateral facet hypertrophy. There is moderate spinal stenosis with flattening of the cord. There is foraminal encroachment bilaterally secondary to spurring.  C4-5: Diffuse disc degeneration with a left paracentral disc protrusion causing mild flattening of the cord. Mild spinal stenosis. Neural foramina widely patent.  C5-6:  Small central disc protrusion with mild spinal stenosis noted  C6-7: Small central disc protrusion without significant spinal stenosis  C7-T1:  Negative  CT myelogram thoracic spine:  Negative for fracture or mass. No cord compression. Spinal cord morphology is normal and no mass lesion is identified in the cord.  Diffuse thoracic disc degeneration with disc space narrowing in the mid and lower thoracic spine. This is most severe at T12-L1 and L1-2 were there is marked disc space narrowing, endplate sclerosis and cystic change. There is mild osteophyte at T12-L1 and L1-2 without significant spinal stenosis or cord deformity. No disc protrusion identified.  IMPRESSION: Cervical spondylosis most severe at C3-4 where there is moderate spinal stenosis and foraminal encroachment bilaterally. There is a left paracentral disc protrusion at C4-5 with mild spinal stenosis. Mild spinal stenosis at C5-6 also noted.  Thoracic degenerative changes most prominent at T12-L1 and L1-2. No cord compression or significant thoracic spinal stenosis.   Electronically Signed   By: Marlan Palau M.D.   On: 12/04/2013 14:19   Dg Myelography Lumbar Inj Multi Region  12/05/2013   CLINICAL DATA:  BILATERAL lower extremity weakness, hyperreflexia with up going toes, question cord compression, cervical spinal stenosis on noncontrast CT, inability to have MRI  FLUOROSCOPY TIME:  dictate in  minutes and seconds  PROCEDURE: LUMBAR PUNCTURE FOR CERVICAL AND THORACIC MYELOGRAM  After thorough discussion of risks and benefits of the procedure including bleeding, infection, injury to nerves, blood vessels, adjacent structures as well as headache and CSF leak, written and oral informed consent was obtained. Timeout protocol followed.  Due to patient inability to extend hips and lie prone, patient was placed in a RIGHT lower decubitus position. Patient prepped and draped in usual sterile fashion. Local anesthesia was provided with 3 mL of 1% lidocaine without epinephrine. Lumbar puncture was performed at L3-L4 using a 3 1/2 inch 22-gauge spinal needle via a midline approach. Using a single pass through the dura, the needle was placed within the thecal sac, with return of clear colorless CSF. Opening pressure 18 cm H2O. 8 mL of CSF was obtained for requested laboratory analysis. 10 mL of Omnipaque-300 was injected into the thecal sac, with normal opacification of  the nerve roots and cauda equina consistent with free flow within the subarachnoid space. The patient was then moved to the Trendelenburg position and contrast flowed into the thoracic and cervical spine.  I personally performed the lumbar puncture and administered the intrathecal contrast.  TECHNIQUE: Contiguous axial images were obtained through the Cervical and Thoracic spine after the intrathecal infusion of infusion. Coronal and sagittal reconstructions were obtained of the axial image sets. Cervical and thoracic CT myelography images are reported separately.  FINDINGS: Free flow of contrast was identified from the the injection site at the lumbar spine to the cervical spine.  Indentation of the ventral aspect of the thecal sac is identified at the L1-L2 level by bulging disc and endplate spur formation, please refer to CT.  Grade 2 anterolisthesis L5-S1.  No immediate complications.  IMPRESSION: Lumbar puncture and contrast injection for cervical  and thoracic CT myelography as above.   Electronically Signed   By: Ulyses Southward M.D.   On: 12/05/2013 08:19    Scheduled Meds: . budesonide  0.25 mg Nebulization BID  . diazepam  10 mg Oral QID  . methylPREDNISolone (SOLU-MEDROL) injection  1,000 mg Intravenous Q24H  . senna  1 tablet Oral QHS   Continuous Infusions: . dextrose     Assessment and plan:  Principal Problem:   Multiple falls Active Problems:   OSA (obstructive sleep apnea)   Dehydration with hypernatremia   Rhabdomyolysis   Bilateral leg pain   Skin abrasion   COPD with chronic bronchitis   Tobacco abuse   Degenerative joint disease of spine   Lumbosacral spondylosis without myelopathy   Cervical spondylosis with myelopathy      Time spent: 45 minutes coordinating care between neurosurgery and hospitalists in [place].    MEMON,JEHANZEB  [facility] Pager [phone] If 7PM-7AM, please contact night-coverage at [email or web address], password [facility] 12/05/2013, 3:07 PM  LOS: 3 days

## 2013-12-05 NOTE — Progress Notes (Signed)
Patient ID: [name], male   DOB: 1965/12/20, 48 y.o.   MRN: [id number]  Joshua A. Merlene Laughter, MD     [email or web address]          [name] is an 48 y.o. male.   Assessment/Plan:    Subacute gait disorder of unclear etiology. However, the exam suggests cervical myelopathy with brisk reflexes and upgoing toes. 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 This is the most likely culprit for the patient's spasticity, leg pain and weakness. Consequently, the patient be given high-dose steroids. The patient should be setup for neurosurgical opinion regarding this lesion. The patient's spasticity is also severe limiting factor. The dose of Valium will be increased.  Primary Patient does have an elevated CPK which is likely due to rhabdomyolysis as opposed to a myopathy. Hypersomnia suspected due to sleep apnea possibly medication effect. The patient has been placed on positive pressure.   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    The myelogram is reviewed in person. There is multilevel disc ossified complex and facet hypertrophy. The most likely finding however he is severe canal stenosis with encroachment on the spinal cord at C3-C4. At the most severe point of encroachment, there is almost complete obliteration of the spinal fluid. This therefore it is the most likely exhalation for the patient myelopathic symptoms.   GENERAL: Pleasant man who seems to be in significant pain involving the legs.  HEENT: Supple. There are significant bruises noted that the nasal bridge and for head.  ABDOMEN: soft  EXTREMITIES: No edema  BACK: Normal.  SKIN: Normal by inspection.  MENTAL STATUS: Alert and oriented To hospital month and year. He also is oriented to his overall medical condition. Speech, language and cognition  are generally Unremarkable. Judgment and insight Fair.  CRANIAL NERVES: Pupils are equal, round and reactive to light and accommodation; extra ocular movements are full, there is no significant nystagmus; visual fields are full; upper and lower facial muscles are normal in strength and symmetric, there is no flattening of the nasolabial folds; tongue is midline; uvula is midline; shoulder elevation is normal.  MOTOR: Normal tone, bulk and strength- In the upper extremities; no pronator drift. He has at least antigravity strength in the legs but there appears to be contracture at the knees which is new per the patient. He is complaining of severe pain in the legs. There appears to be increased tone in the legs. He has significantly reduced contracture on the right but they're still contracture and flexure spasm of the left lower extremity. COORDINATION: Left finger to nose is normal, right finger to nose is normal, No rest tremor; no intention tremor; no postural tremor; no bradykinesia.  REFLEXES: Deep tendon reflexes are symmetrical But pathologically brisk throughout including the upper extremities. Babinski reflexes are Extensor bilaterally.  SENSATION: Normal to light touch.        Objective: Vital signs in last 24 hours: Temp:  [97.8 F (36.6 C)-98.7 F (37.1 C)] 97.8 F (36.6 C) (07/30 0421) Pulse Rate:  [92-113] 92 (07/30 0421) Resp:  [20] 20 (07/30 0421) BP: (91-122)/(54-69) 91/54 mmHg (07/30 0421) SpO2:  [90 %-97 %] 90 % (07/30 0652) Weight:  [87.862 kg (193 lb 11.2 oz)] 87.862 kg (193 lb 11.2 oz) (07/30 0421)  Intake/Output from previous day: 07/29 0701 - 07/30 0700 In:  1949.3 [P.O.:870; I.V.:1021.3; IV Piggyback:58] Out: 1200 [Urine:1200] Intake/Output this shift:   Nutritional status: Full Liquid   Lab Results: Results for orders placed during the hospital encounter of 12/02/13 (from the past 48 hour(s))  GLUCOSE, CAPILLARY     Status: None   Collection Time     12/03/13 10:00 PM      Result Value Ref Range   Glucose-Capillary 81  70 - 99 mg/dL  BASIC METABOLIC PANEL     Status: Abnormal   Collection Time    12/04/13  7:17 AM      Result Value Ref Range   Sodium 147  137 - 147 mEq/L   Potassium 5.1  3.7 - 5.3 mEq/L   Chloride 113 (*) 96 - 112 mEq/L   CO2 25  19 - 32 mEq/L   Glucose, Bld 90  70 - 99 mg/dL   BUN 16  6 - 23 mg/dL   Creatinine, Ser 1.13  0.50 - 1.35 mg/dL   Calcium 8.8  8.4 - 10.5 mg/dL   GFR calc non Af Amer 76 (*) >90 mL/min   GFR calc Af Amer 88 (*) >90 mL/min   Comment: (NOTE)     The eGFR has been calculated using the CKD EPI equation.     This calculation has not been validated in all clinical situations.     eGFR's persistently <90 mL/min signify possible Chronic Kidney     Disease.   Anion gap 9  5 - 15  CSF CELL COUNT WITH DIFFERENTIAL     Status: Abnormal   Collection Time    12/04/13  1:00 PM      Result Value Ref Range   Tube # 3     Color, CSF COLORLESS  COLORLESS   Appearance, CSF CLEAR  CLEAR   Supernatant NOT INDICATED     RBC Count, CSF 4 (*) 0 /cu mm   WBC, CSF 2  0 - 5 /cu mm   Segmented Neutrophils-CSF TOO FEW TO COUNT, SMEAR AVAILABLE FOR REVIEW  0 - 6 %  GLUCOSE, CSF     Status: None   Collection Time    12/04/13  1:00 PM      Result Value Ref Range   Glucose, CSF 74  43 - 76 mg/dL  PROTEIN, CSF     Status: Abnormal   Collection Time    12/04/13  1:00 PM      Result Value Ref Range   Total  Protein, CSF 52 (*) 15 - 45 mg/dL  BASIC METABOLIC PANEL     Status: Abnormal   Collection Time    12/05/13  5:56 AM      Result Value Ref Range   Sodium 148 (*) 137 - 147 mEq/L   Potassium 5.5 (*) 3.7 - 5.3 mEq/L   Chloride 114 (*) 96 - 112 mEq/L   CO2 24  19 - 32 mEq/L   Glucose, Bld 157 (*) 70 - 99 mg/dL   BUN 21  6 - 23 mg/dL   Creatinine, Ser 1.19  0.50 - 1.35 mg/dL   Calcium 9.0  8.4 - 10.5 mg/dL   GFR calc non Af Amer 71 (*) >90 mL/min   GFR calc Af Amer 83 (*) >90 mL/min   Comment: (NOTE)      The eGFR has been calculated using the CKD EPI equation.     This calculation has not been validated in all clinical situations.     eGFR's persistently <90  mL/min signify possible Chronic Kidney     Disease.   Anion gap 10  5 - 15    Lipid Panel No results found for this basename: CHOL, TRIG, HDL, CHOLHDL, VLDL, LDLCALC,  in the last 72 hours  Studies/Results: Ct Cervical Spine W Contrast  12/04/2013   CLINICAL DATA:  Cervical myelopathy. Lower extremity weakness. Spasticity.  EXAM: CT MYELOGRAPHY CERVICAL AND THORACIC SPINE  TECHNIQUE: CT imaging of the cervical and thoracic spine was performed after intrathecal contrast administration. Multiplanar CT image reconstructions were also generated.  COMPARISON:  CT cervical and lumbar spine 12/02/2013  FINDINGS: CT myelogram cervical spine:  Lumbar puncture and injection of contrast by Dr. Thornton Papas. The patient was not able to get into the magnet for MRI.  Mild retrolisthesis at C3-4 secondary to disc and facet degeneration. Negative for fracture or mass. No bony destruction.  C2-3:  Small central disc protrusion without cord deformity  C3-4: Marked disc degeneration with disc space narrowing endplate sclerosis and endplate cystic changes. Cervical spondylosis with diffuse uncinate spurring. Bilateral facet hypertrophy. There is moderate spinal stenosis with flattening of the cord. There is foraminal encroachment bilaterally secondary to spurring.  C4-5: Diffuse disc degeneration with a left paracentral disc protrusion causing mild flattening of the cord. Mild spinal stenosis. Neural foramina widely patent.  C5-6:  Small central disc protrusion with mild spinal stenosis noted  C6-7: Small central disc protrusion without significant spinal stenosis  C7-T1:  Negative  CT myelogram thoracic spine:  Negative for fracture or mass. No cord compression. Spinal cord morphology is normal and no mass lesion is identified in the cord.  Diffuse thoracic disc  degeneration with disc space narrowing in the mid and lower thoracic spine. This is most severe at T12-L1 and L1-2 were there is marked disc space narrowing, endplate sclerosis and cystic change. There is mild osteophyte at T12-L1 and L1-2 without significant spinal stenosis or cord deformity. No disc protrusion identified.  IMPRESSION: Cervical spondylosis most severe at C3-4 where there is moderate spinal stenosis and foraminal encroachment bilaterally. There is a left paracentral disc protrusion at C4-5 with mild spinal stenosis. Mild spinal stenosis at C5-6 also noted.  Thoracic degenerative changes most prominent at T12-L1 and L1-2. No cord compression or significant thoracic spinal stenosis.   Electronically Signed   By: Franchot Gallo M.D.   On: 12/04/2013 14:19   Ct Thoracic Spine W Contrast  12/04/2013   CLINICAL DATA:  Cervical myelopathy. Lower extremity weakness. Spasticity.  EXAM: CT MYELOGRAPHY CERVICAL AND THORACIC SPINE  TECHNIQUE: CT imaging of the cervical and thoracic spine was performed after intrathecal contrast administration. Multiplanar CT image reconstructions were also generated.  COMPARISON:  CT cervical and lumbar spine 12/02/2013  FINDINGS: CT myelogram cervical spine:  Lumbar puncture and injection of contrast by Dr. Thornton Papas. The patient was not able to get into the magnet for MRI.  Mild retrolisthesis at C3-4 secondary to disc and facet degeneration. Negative for fracture or mass. No bony destruction.  C2-3:  Small central disc protrusion without cord deformity  C3-4: Marked disc degeneration with disc space narrowing endplate sclerosis and endplate cystic changes. Cervical spondylosis with diffuse uncinate spurring. Bilateral facet hypertrophy. There is moderate spinal stenosis with flattening of the cord. There is foraminal encroachment bilaterally secondary to spurring.  C4-5: Diffuse disc degeneration with a left paracentral disc protrusion causing mild flattening of the cord.  Mild spinal stenosis. Neural foramina widely patent.  C5-6:  Small central disc protrusion with mild  spinal stenosis noted  C6-7: Small central disc protrusion without significant spinal stenosis  C7-T1:  Negative  CT myelogram thoracic spine:  Negative for fracture or mass. No cord compression. Spinal cord morphology is normal and no mass lesion is identified in the cord.  Diffuse thoracic disc degeneration with disc space narrowing in the mid and lower thoracic spine. This is most severe at T12-L1 and L1-2 were there is marked disc space narrowing, endplate sclerosis and cystic change. There is mild osteophyte at T12-L1 and L1-2 without significant spinal stenosis or cord deformity. No disc protrusion identified.  IMPRESSION: Cervical spondylosis most severe at C3-4 where there is moderate spinal stenosis and foraminal encroachment bilaterally. There is a left paracentral disc protrusion at C4-5 with mild spinal stenosis. Mild spinal stenosis at C5-6 also noted.  Thoracic degenerative changes most prominent at T12-L1 and L1-2. No cord compression or significant thoracic spinal stenosis.   Electronically Signed   By: Franchot Gallo M.D.   On: 12/04/2013 14:19   Dg Myelography Lumbar Inj Multi Region  12/05/2013   CLINICAL DATA:  BILATERAL lower extremity weakness, hyperreflexia with up going toes, question cord compression, cervical spinal stenosis on noncontrast CT, inability to have MRI  FLUOROSCOPY TIME:  dictate in minutes and seconds  PROCEDURE: LUMBAR PUNCTURE FOR CERVICAL AND THORACIC MYELOGRAM  After thorough discussion of risks and benefits of the procedure including bleeding, infection, injury to nerves, blood vessels, adjacent structures as well as headache and CSF leak, written and oral informed consent was obtained. Timeout protocol followed.  Due to patient inability to extend hips and lie prone, patient was placed in a RIGHT lower decubitus position. Patient prepped and draped in usual sterile  fashion. Local anesthesia was provided with 3 mL of 1% lidocaine without epinephrine. Lumbar puncture was performed at L3-L4 using a 3 1/2 inch 22-gauge spinal needle via a midline approach. Using a single pass through the dura, the needle was placed within the thecal sac, with return of clear colorless CSF. Opening pressure 18 cm H2O. 8 mL of CSF was obtained for requested laboratory analysis. 10 mL of Omnipaque-300 was injected into the thecal sac, with normal opacification of the nerve roots and cauda equina consistent with free flow within the subarachnoid space. The patient was then moved to the Trendelenburg position and contrast flowed into the thoracic and cervical spine.  I personally performed the lumbar puncture and administered the intrathecal contrast.  TECHNIQUE: Contiguous axial images were obtained through the Cervical and Thoracic spine after the intrathecal infusion of infusion. Coronal and sagittal reconstructions were obtained of the axial image sets. Cervical and thoracic CT myelography images are reported separately.  FINDINGS: Free flow of contrast was identified from the the injection site at the lumbar spine to the cervical spine.  Indentation of the ventral aspect of the thecal sac is identified at the L1-L2 level by bulging disc and endplate spur formation, please refer to CT.  Grade 2 anterolisthesis L5-S1.  No immediate complications.  IMPRESSION: Lumbar puncture and contrast injection for cervical and thoracic CT myelography as above.   Electronically Signed   By: Lavonia Dana M.D.   On: 12/05/2013 08:19    Medications:  Scheduled Meds: . budesonide  0.25 mg Nebulization BID  . diazepam  10 mg Oral QID  . methylPREDNISolone (SOLU-MEDROL) injection  1,000 mg Intravenous Q24H  . senna  1 tablet Oral QHS   Continuous Infusions: . sodium chloride 75 mL/hr at 12/05/13 0600   PRN  Meds:.acetaminophen, acetaminophen, albuterol, alum & mag hydroxide-simeth,  guaiFENesin-dextromethorphan, HYDROcodone-acetaminophen, morphine injection, ondansetron (ZOFRAN) IV, ondansetron     LOS: 3 days   Riyansh Gerstner A. Merlene Cummings, M.D.  Diplomate, Tax adviser of Psychiatry and Neurology ( Neurology).

## 2013-12-05 NOTE — Consult Note (Signed)
CC:  Chief Complaint  Patient presents with  . Fall    HPI: [name] is a 48 y.o. male transferred from [facility] for leg pain. The patient is a somewhat unreliable historian but states that he has fallen several times recently. Today he says he has had pain primarily in the front of his thighs for the past few weeks. He denies pain below the knees or any real back pain. He also denies any neck pain. He has no arm pain. He says his leg pain is worst when he tries to move his legs, especially straighten his legs. He currently has a foley but says prior to his admission to the hospital he did not have any difficulty with bladder control or emptying his bladder. He is denying any numbness or tingling of the arms, legs, or groin.   Review of the EMR reveals he does live at an assisted living facility, and apparently has bee seen numerous times recently in the ED for falls. He is noted to walk with a walker at baseline and is "hunched over" when he walks. There is also note of BLE spasticity previously.  PMH: Past Medical History  Diagnosis Date  . Acute respiratory failure   . Hypoxemia   . Encephalopathy, unspecified   . Pain in limb   . Hyperosmolality and/or hypernatremia   . Dehydration   . Altered mental status   . Other specific developmental learning difficulties   . Acidosis   . Gallstone ileus   . Unspecified vitamin B deficiency   . Tobacco use disorder   . Acute conjunctivitis, unspecified   . Other chronic allergic conjunctivitis   . Unspecified essential hypertension   . Cellulitis and abscess of unspecified site   . Muscle weakness (generalized)   . Cognitive communication deficit   . COPD (chronic obstructive pulmonary disease)   . Sleep apnea   . 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 Acute respiratory failure with hypoxia 09/20/2012  . Acute respiratory acidosis 09/20/2012  . Cellulitis of left lower extremity 09/20/2012  . OSA (obstructive sleep apnea) 09/20/2012  .  Lumbosacral spondylosis without myelopathy 12/03/2013  . Degenerative joint disease of spine 12/03/2013  . Cervical spondylosis with myelopathy 12/03/2013    PSH: Past Surgical History  Procedure Laterality Date  . Eye surgery      cataracts    SH: History  Substance Use Topics  . Smoking status: Current Every Day Smoker  . Smokeless tobacco: Not on file  . Alcohol Use: No    MEDS: Prior to Admission medications   Medication Sig Start Date End Date Taking? Authorizing Provider  albuterol (PROVENTIL) (5 MG/ML) 0.5% nebulizer solution Take 0.5 mLs (2.5 mg total) by nebulization every 6 (six) hours as needed for wheezing. 09/24/12  Yes Marianne L York, PA-C  budesonide (PULMICORT) 0.25 MG/2ML nebulizer solution Take 2 mLs (0.25 mg total) by nebulization 2 (two) times daily. 09/24/12  Yes Marianne L York, PA-C  budesonide (PULMICORT) 180 MCG/ACT inhaler Inhale into the lungs 2 (two) times daily.   Yes Historical Provider, MD  cyclobenzaprine (FLEXERIL) 10 MG tablet Take 1 tablet (10 mg total) by mouth 2 (two) times daily as needed for muscle spasms. 11/28/13  Yes Vida RollerBrian D Miller, MD  ibuprofen (ADVIL,MOTRIN) 800 MG tablet Take 800 mg by mouth every 8 (eight) hours as needed.   Yes Historical Provider, MD  HYDROcodone-acetaminophen (NORCO/VICODIN) 5-325 MG per tablet Take 1 tablet by mouth every 4 (four) hours as needed for  moderate pain. 12/02/13   Burgess Amor, PA-C    ALLERGY: No Known Allergies  ROS: Review of Systems  Constitutional: Positive for malaise/fatigue.  Eyes: Negative for blurred vision and double vision.  Respiratory: Negative for cough.   Cardiovascular: Negative for chest pain, palpitations and leg swelling.  Gastrointestinal: Negative for heartburn, nausea and vomiting.  Genitourinary: Negative.   Musculoskeletal: Positive for falls and myalgias. Negative for back pain, joint pain and neck pain.  Skin: Negative for itching and rash.  Neurological: Positive for focal  weakness. Negative for dizziness, tingling, tremors, sensory change, speech change, seizures, loss of consciousness and headaches.  Endo/Heme/Allergies: Negative.   Psychiatric/Behavioral: Negative for memory loss.    NEUROLOGIC EXAM: Awake, alert, oriented Memory and concentration grossly intact Speech fluent, appropriate CN grossly intact Motor exam: Upper Extremities Deltoid Bicep Tricep Grip  Right 5/5 5/5 5/5 5/5  Left 5/5 5/5 5/5 5/5   Lower extremities demonstrate increased tone/contracture in hip flexors, knee flexors, and platar flexors.    Pt able to wiggle toes, decreased strength in dorsiflexion, knee ext, and hip flexion  Sensation grossly intact to LT  [facility]: Previous CT L-spine shows grade 2 spondylolisthesis L5-S1 with likely severe canal stenosis.   CT myelogram C-spine shows spondylotic change at C3-4 with disc herniation and effacement of ventral CSF space and mild cord flattening/moderate central stenosis. C4-5 demonstrates similar findings with moderate, slightly less severe canal stenosis.  CT myelogram T-spine does not demonstrate any cord compression.  IMPRESSION: - 48 y.o. male with what appear to be muscle spasms and diffuse BLE spasticity/contracture which is indicative of a long-term chronic issue - The L5-S1 degenerative spondylolisthesis does not explain his diffuse proximal and distal BLE weakness. The C3-C5 stenosis could explain his BLE findings but seems less likely without any significant upper extremity findings. There has not been any imaging of the upper lumbar spine for eval of the lowerspinal core/conus and upper cauda equina.  PLAN: - Would recommend MRI L-spine with anesthesia if required - Spasmolytic for leg cramps/spams

## 2013-12-05 NOTE — Clinical Social Work Note (Signed)
Pt transferring to [facility] this afternoon. [facility] [place] [facility]   Derenda FennelKara Hoa Deriso, [place] [phone]

## 2013-12-05 NOTE — Progress Notes (Signed)
[facility]  Pt oriented to the room and the unit.  VSS, assessment done.  Will continue to monitor. Sondra ComeSilva, Amaree Loisel M, RN 12/05/2013

## 2013-12-05 NOTE — Progress Notes (Signed)
[facility] Patient being transferred for further evaluation. Spoke with next of kin and informed of transfer. MD aware.

## 2013-12-05 NOTE — Progress Notes (Signed)
Pt has been refusing CPAP at night

## 2013-12-06 DIAGNOSIS — T796XXA Traumatic ischemia of muscle, initial encounter: Secondary | ICD-10-CM

## 2013-12-06 DIAGNOSIS — G4730 Sleep apnea, unspecified: Secondary | ICD-10-CM

## 2013-12-06 DIAGNOSIS — IMO0002 Reserved for concepts with insufficient information to code with codable children: Secondary | ICD-10-CM

## 2013-12-06 DIAGNOSIS — G9340 Encephalopathy, unspecified: Secondary | ICD-10-CM

## 2013-12-06 DIAGNOSIS — F17200 Nicotine dependence, unspecified, uncomplicated: Secondary | ICD-10-CM

## 2013-12-06 NOTE — Evaluation (Signed)
Occupational Therapy Evaluation Patient Details Name: [name] MRN: [id number] DOB: February 13, 1966 Today's Date: 12/06/2013    History of Present Illness  48 y.o. male admitted from [facility]. Pt with multiple falls one from bed level and two from w/c seated position. Pt has been w/c dependent due to LE weakness.Prior to his recent ER visits, he had been ambulating with a walker. Pt from [facility] assisted living facility. CT reveals probable fracture in low thoracic and upper lumbar spine and anterolisthesis of L5 on S1; imaging difficulty due to flexed position.  Pt possibly being sent for myelogram of spine per neurology. PMH developmental disability, obstructive sleep apnea, COPD,  tobacco abuse, acute respiratory failure with hypoxia secondary to obstructive sleep apnea and opiate-induced altered mentation during the hospitalization in May of 2014   Clinical Impression   PT admitted with bil Le weakness. Neurosurgery workup underway.  Pt currently with functional limitiations due to the deficits listed below (see OT problem list). PTA living at ALF and walking with RW. Pt recently required w/c use.  Pt will benefit from skilled OT to increase their independence and safety with adls and balance to allow discharge SNF. Ot to follow acutely for adl retraining and basic transfer.      Follow Up Recommendations  SNF;Supervision/Assistance - 24 hour    Equipment Recommendations  Wheelchair cushion (measurements OT);Wheelchair (measurements OT);Hospital bed    Recommendations for Other Services       Precautions / Restrictions Precautions Precautions: Fall Precaution Comments: h/o mult falls at  ALF Restrictions Weight Bearing Restrictions: No      Mobility Bed Mobility Overal bed mobility: Needs Assistance Bed Mobility: Rolling;Supine to Sit Rolling: Total assist   Supine to sit: +2 for physical assistance;Max assist     General bed mobility comments: pt reaching with RT  UE for bed rail. Pt unable to log roll toward rail  Transfers Overall transfer level: Needs assistance Equipment used: 2 person hand held assist Transfers: Squat Pivot Transfers Sit to Stand: Total assist;+2 physical assistance   Squat pivot transfers: Total assist;+2 physical assistance     General transfer comment: anterior weight shift required and bed elelvated. Pt unable to place bil LE on floor Pt pivoted on Rt LE only with LE blocked    Balance Overall balance assessment: Needs assistance Sitting-balance support: Feet supported;Bilateral upper extremity supported Sitting balance-Leahy Scale: Zero Sitting balance - Comments: Patient requiring mod assist to maintain balance at edge of chair.  Difficulty using UE's to assist. Postural control: Posterior lean Standing balance support: Bilateral upper extremity supported;During functional activity Standing balance-Leahy Scale: Zero                              ADL Overall ADL's : Needs assistance/impaired     Grooming: Wash/dry face;Maximal assistance                   Toilet Transfer: +2 for physical assistance;Total assistance;Squat-pivot Toilet Transfer Details (indicate cue type and reason): Pt unable to place LT LE on floor. Pt required RT LE blcoked. Pt total (A)t o swing hips to chair. Hoyer pad placed in chair and RN notified hoyer lift for all transfers         Functional mobility during ADLs:  (unabel to complete) General ADL Comments: pt unable to reposition self in bed on arrival. pt with very rigid extensor tone throughout trunk. pt with bil LE knee  fleixon and hip flexion in supine. pt is unable to extend bil LE . pt transfer to EOB and then to chair. Pt is total (A) for all adls. Question PTA levels of assistance from staff. Question length of progressive weakness with incr assistance     Vision                     Perception     Praxis      Pertinent Vitals/Pain Pain with  any ROM of BIL LE     Hand Dominance Right   Extremity/Trunk Assessment Upper Extremity Assessment Upper Extremity Assessment: RUE deficits/detail;LUE deficits/detail RUE Deficits / Details: ataxic movement, elbow flexed LUE Deficits / Details: ataxic movement   Lower Extremity Assessment Lower Extremity Assessment: Defer to PT evaluation LLE Deficits / Details: hip internal rotation of hip and LT LE cross RT LE. OT unable to reposition hip to neutral position   Cervical / Trunk Assessment Cervical / Trunk Assessment: Kyphotic   Communication Communication Communication: No difficulties   Cognition Arousal/Alertness: Awake/alert Behavior During Therapy: WFL for tasks assessed/performed;Impulsive Overall Cognitive Status: History of cognitive impairments - at baseline                     General Comments       Exercises       Shoulder Instructions      Home Living Family/patient expects to be discharged to:: Skilled nursing facility                                 Additional Comments: Pt lives at [facility].  Per facility, pt was able to ambulate with use of RW without limitations.  Due to multiple falls, pt was placed in W/C for safety, however has had falls out of W/C.       Prior Functioning/Environment Level of Independence: Independent with assistive device(s)        Comments: pt reports independence with ADLs.  May have been receiving assist with IADLs (was living in assisted living). Reports using walker to ambulate.    OT Diagnosis: Generalized weakness;Acute pain;Ataxia   OT Problem List: Decreased strength;Decreased activity tolerance;Impaired balance (sitting and/or standing);Decreased safety awareness;Decreased knowledge of use of DME or AE;Decreased knowledge of precautions;Pain;Decreased cognition;Decreased coordination;Decreased range of motion;Impaired UE functional use   OT Treatment/Interventions: Self-care/ADL  training;Therapeutic exercise;Neuromuscular education;DME and/or AE instruction;Therapeutic activities;Cognitive remediation/compensation;Patient/family education;Balance training    OT Goals(Current goals can be found in the care plan section) Acute Rehab OT Goals OT Goal Formulation: Patient unable to participate in goal setting Time For Goal Achievement: Jan 10, 2014 Potential to Achieve Goals: Fair  OT Frequency: Min 2X/week   Barriers to D/C:            Co-evaluation              End of Session Equipment Utilized During Treatment: Gait belt Nurse Communication: Mobility status;Need for lift equipment;Precautions  Activity Tolerance: Patient tolerated treatment well Patient left: in chair;with call bell/phone within reach;with chair alarm set   Time: 1610-9604 OT Time Calculation (min): 17 min Charges:  OT General Charges $OT Visit: 1 Procedure OT Evaluation $Initial OT Evaluation Tier I: 1 Procedure OT Treatments $Self Care/Home Management : 8-22 mins G-Codes:    Harolyn Rutherford 2013/12/27, 3:11 PM Pager: [phone]

## 2013-12-06 NOTE — Progress Notes (Signed)
[facility]                                                                              Patient Demographics  [name], is a 48 y.o. male, DOB - July 01, 1965, ZOX:[id number]  Admit date - 12/02/2013   Admitting Physician [name] Joshua Dredge, MD  Outpatient Primary MD for the patient is [name], Provider, MD  LOS - 4   Chief Complaint  Patient presents with  . Fall      HPI on 12/02/2013 [name] is a 48 y.o. male with a history of developmental disability, obstructive sleep apnea, COPD, and tobacco abuse. He also has a history of acute respiratory failure with hypoxia secondary to obstructive sleep apnea and opiate-induced altered mentation during the hospitalization in May of 2014. Prior to his recent ER visits, he had been ambulating with a walker with no difficulty per history. The patient initially presented to the emergency department on 11/28/2013 with a fall. Apparently, at that time, he rolled out of bed and fell on the floor. He complained of cramping in his left thigh. X-rays at that time revealed no acute fractures. He was given Flexeril and then discharged back to [facility] assisted living. Apparently, he was instructed to ambulate via a wheelchair. He presented again on 7/25 and 7/26 for recurrent falls from his wheelchair. More x-rays were ordered and revealed no acute fractures. CT of his head was negative. He returned on 12/02/2013 for the same issues. He reported falling asleep in the wheelchair and falling on the floor. He does not recall passing out. He has no history of seizures. He denied any prior headache, chest pain, palpitations, shortness of breath, fever, chills, nausea, vomiting, or diarrhea. He complained of severe cramping pain in both of his legs mostly over his thighs, and so painful that he cannot walk. He also cannot straighten out his legs without pain being excruciating. It radiated to his groin. He denied neck pain or lower back pain.  In the ED,  he was mildly tachycardic, otherwise hemodynamically stable. He was afebrile. His lab data are significant for total CK of 2997, WBC of 11.1, hemoglobin 11.7, and sodium of 150. X-ray of his lumbar spine reveals probable fractures in the lower thoracic and upper lumbar region. He was admitted for further evaluation and management.  Interim history Patient was sent to [facility] cone for further evaluation by neurosurgery. Patient was to have an MRI of the lumbar spine.  Assessment & Plan   Multiple falls with gait disorder, possibly related to cervical myelopathy -Patient had multiple falls at assisted living, had also fallen out of his wheelchair, and continues to complain of lower extremity pain and spasms. Patient attributes his falls his obstructive sleep apnea. -MRI was attempted upon admission however patient was unable to tolerate it. -CT of the spine was ordered and showed severe degenerative changes with spondylosis throughout the spine. There was no indication of cord impingement or compromise on initial noncontrast CT. -CT of the head was negative on admission -Neurology at [facility], [name] was consulted and believed the patient had a cervical myelopathy -MRI of the cervical spine was ordered, patient was unable  to lie flat, CT myelogram was ordered by neurology, findings are indicative of severe canal stenosis with encroachment on the spinal cord at C3-C4.  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 Conchita Cummings for further recommendations.  Mild rhabdomyolysis -Likely secondary to falls -CPK levels have normalized  Dehydration with hypernatremia -Patient has a history of this. Denied any nausea or vomiting or diarrhea -Was placed on half-normal saline on admission and transitioned to dextrose infusion -Hypernatremia, resolved  Obstructive sleep apnea -Continue CPAP however patient's daughter declined  COPD with tobacco abuse -Continue inhalers -Patient declines nicotine patch -Smoking cessation counseling given  Mild Leukocytosis -Resolved, Likely secondary to stress reaction versus steroids -No evidence of active infection  Macrocytic Anemia -Hemoglobin remained stable at 11.8 -Anemia panel was normal last year -Thiamine B12 and TSH are within normal limits  Developmental disability -Stable  Sinus tachycardia -Likely secondary to pain, TSH normal  Code Status: Full  Family Communication: None at bedside  Disposition Plan: Admitted, pending further workup  Time Spent in minutes   30 minutes  Procedures  None  Consults   Neurology Neurosurgery  DVT Prophylaxis  Heparin  Lab Results  Component Value Date   PLT 217 12/03/2013    Medications  Scheduled Meds: . budesonide  0.25 mg Nebulization BID  . diazepam  10 mg Oral QID  . heparin subcutaneous  5,000 Units Subcutaneous 3 times per day  . methylPREDNISolone (SOLU-MEDROL) injection  1,000 mg Intravenous Q24H  . senna  1 tablet Oral QHS   Continuous Infusions: . dextrose 1,000 mL (12/06/13 0714)   PRN Meds:.acetaminophen, acetaminophen, albuterol, alum & mag hydroxide-simeth, guaiFENesin-dextromethorphan, HYDROcodone-acetaminophen, morphine injection, ondansetron (ZOFRAN) IV, ondansetron  Antibiotics    Anti-infectives    None      Subjective:   [name] seen and examined today.  Patient complains of pain and spasticity. He is unable to straighten out his legs. Patient currently complains of feeling very sleepy. Denies any dizziness, chest pain, headache, shortness of breath, abdominal pain.  Objective:   Filed Vitals:   12/06/13 0200 12/06/13 0500 12/06/13 0600 12/06/13 0936  BP: 109/46  95/49 117/79  Pulse: 106  88 106  Temp: 98.2 F (36.8 C)  96.1 F (35.6 C) 97.9 F (36.6 C)  TempSrc: Oral  Oral Oral  Resp: 18  18 20   Height:      Weight:  88.497 kg (195 lb 1.6 oz) 88.451 kg (195 lb)   SpO2: 97%  95% 94%    Wt Readings from Last 3 Encounters:  12/06/13 88.451 kg (195 lb)  12/01/13 90.719 kg (200 lb)  11/30/13 90.719 kg (200 lb)     Intake/Output Summary (Last 24 hours) at 12/06/13 1202 Last data filed at 12/06/13 0714  Gross per 24 hour  Intake   1385 ml  Output      0 ml  Net   1385 ml  Exam  General: Well developed, well nourished, NAD, appears stated age  HEENT: [place], abrasions on the frontal scalp, nasal bridge, forehead, PERRLA, EOMI, Anicteic Sclera, mucous membranes moist.   Neck: Supple, no JVD, no masses  Cardiovascular: S1 S2 auscultated, no rubs, murmurs or gallops. Regular rate and rhythm.  Respiratory: Clear to auscultation bilaterally with equal chest rise  Abdomen: Soft, nontender, nondistended, + bowel sounds  Extremities: warm dry without cyanosis clubbing. Trace edema in lower extremities bilaterally.  Neuro: AAOx3, cranial nerves grossly intact. Able to move extremities sporadically. Patient will not extend his legs.  Data Review   Micro Results No results found for this or any previous visit (from the past 240 hour(s)).  Radiology Reports Dg Lumbar Spine 2-3 Views  12/02/2013   CLINICAL DATA:  Pain post trauma  EXAM: LUMBAR SPINE - 2-3 VIEW  COMPARISON:  Report of prior lumbar series June 18, 2011 available ; images from that study  cannot be retrieved at this time.  FINDINGS: Frontal and lateral views were obtained. There are 5 non-rib-bearing lumbar type vertebral bodies. There is lumbar levoscoliosis. There is remodeling of the T11, T12, L1, and L2 vertebral bodies with what appears to be chronic wedging at these levels. No acute appearing fracture is seen. There is 1.7 cm of anterolisthesis of L5 on S1. No other spondylolisthesis is seen.  There is moderately severe disc space narrowing at all levels. No erosive change.  IMPRESSION: Probable fractures in the lower thoracic and upper lumbar region. 1.7 cm of anterolisthesis of L5 on S1. By report, there was 1.1 cm of anterolisthesis of L5 on S1 on the previous study. Suspect progression of arthropathy as the cause of the increase in spondylolisthesis, although traumatic etiology cannot be excluded. There is scoliosis as well as multilevel arthropathy.   Electronically Signed   By: Bretta Bang M.D.   On: 12/02/2013 09:47   Dg Pelvis 1-2 Views  11/30/2013   CLINICAL DATA:  Pelvic pain.  EXAM: PELVIS - 1-2 VIEW  COMPARISON:  None.  FINDINGS: Please note that the patient was very combative and refused to comply with the examination. A limited view of the pelvis was obtained.  No acute bony abnormalities are noted.  Degenerative changes in the lower lumbar spine a present.  Please note that the inferior portion the pelvis left hip are off the field of view.  IMPRESSION: Limited evaluation secondary to patient's inability to comply with examination. No acute bony abnormalities noted.   Electronically Signed   By: Laveda Abbe M.D.   On: 11/30/2013 14:49   Dg Hip Complete Left  12/02/2013   CLINICAL DATA:  Left hip pain status post fall  EXAM: LEFT HIP - COMPLETE 2+ VIEW  COMPARISON:  None.  FINDINGS: There is no obvious hip fracture or dislocation. There is no lytic or blastic osseous lesion. Examination is limited secondary to difficulty in patient positioning secondary to pain.   Degenerative changes of the lower lumbar spine.  IMPRESSION: 1. Limited exam secondary to suboptimal positioning. No evidence of acute fracture or dislocation. If there is persistent clinical concern, and patient is unable to tolerate satisfactory positioning further evaluation with MRI may be helpful.   Electronically Signed   By: Elige Ko   On: 12/02/2013 09:45   Dg Hip Complete Left  11/28/2013   CLINICAL DATA:  Status post fall; left hip pain.  EXAM: LEFT HIP - COMPLETE 2+ VIEW  COMPARISON:  None.  FINDINGS: There is no  evidence of fracture or dislocation. Both femoral heads are seated normally within their respective acetabula. The proximal left femur appears intact. No significant degenerative change is appreciated. The sacroiliac joints are unremarkable in appearance. An apparent loose body is noted at the superior rim of the left acetabulum. Alternatively, this could reflect a small avulsion fracture.  The visualized bowel gas pattern is grossly unremarkable in appearance.  IMPRESSION: 1. No definite evidence of fracture or dislocation. 2. Apparent loose body noted at the superior rim of the left acetabulum. Alternatively, this could reflect a small avulsion fracture.   Electronically Signed   By: Roanna Raider M.D.   On: 11/28/2013 04:08   Dg Femur Left  11/30/2013   CLINICAL DATA:  Fall, combative, confusion, groin pain  EXAM: LEFT FEMUR - 2 VIEW  COMPARISON:  11/29/2013  FINDINGS: Limited exam because of positioning and body habitus. Proximal aspect of the left femur and hip region are obscured by soft tissues and overlapping shadows. No gross displaced fracture evident. Degenerative changes noted of the left knee joint. No definite soft tissue abnormality.  IMPRESSION: Very limited exam because of positioning.  Left knee tricompartmental osteoarthritis.   Electronically Signed   By: Ruel Favors M.D.   On: 11/30/2013 14:48   Dg Femur Left  11/29/2013   CLINICAL DATA:  Pain extending from  the left hip to the knee after falling yesterday.  EXAM: LEFT FEMUR - 2 VIEW  COMPARISON:  Radiographs 12/01/2003.  FINDINGS: Stable coxa magna deformity. No evidence of acute fracture, dislocation or femoral head avascular necrosis. Os acetabuli appears unchanged. Tricompartmental degenerative changes are present at the knee, most advanced within the patellofemoral compartment.  IMPRESSION: No acute osseous findings demonstrated.   Electronically Signed   By: Roxy Horseman M.D.   On: 11/29/2013 02:01   Ct Head Wo Contrast  12/01/2013   CLINICAL DATA:  Headache and weakness. Status post fall out of bed. Skin tear at the mid forehead.  EXAM: CT HEAD WITHOUT CONTRAST  TECHNIQUE: Contiguous axial images were obtained from the base of the skull through the vertex without intravenous contrast.  COMPARISON:  CT of the head performed 11/30/2013  FINDINGS: There is no evidence of acute infarction, mass lesion, or intra- or extra-axial hemorrhage on CT.  The posterior fossa, including the cerebellum, brainstem and fourth ventricle, is within normal limits. The third and lateral ventricles, and basal ganglia are unremarkable in appearance. The cerebral hemispheres are symmetric in appearance, with normal gray-white differentiation. No mass effect or midline shift is seen.  There is no evidence of fracture; visualized osseous structures are unremarkable in appearance. Bilateral proptosis is noted. A small mucus retention cyst or polyp is noted at the left maxillary sinus. The remaining paranasal sinuses and mastoid air cells are well-aerated. Mild soft tissue swelling is noted overlying the left frontal calvarium.  IMPRESSION: 1. No evidence of traumatic intracranial injury or fracture. 2. Mild soft tissue swelling overlying the left frontal calvarium. 3. Bilateral proptosis noted. 4. Small mucus retention cyst or polyp at the left maxillary sinus.   Electronically Signed   By: Roanna Raider M.D.   On: 12/01/2013 04:58    Ct Head Wo Contrast  11/30/2013   CLINICAL DATA:  Pain post trauma ; altered mental status  EXAM: CT HEAD WITHOUT CONTRAST  TECHNIQUE: Contiguous axial images were obtained from the base of the skull through the vertex without intravenous contrast.  COMPARISON:  August 08, 2013  FINDINGS: The ventricles are  normal in size and configuration. There is a rather minimal cavum septum pellucidum, an anatomic variant. There is no mass, hemorrhage, extra-axial fluid collection, or midline shift. Wallace Cullens and white compartments appear within normal limits. No apparent acute infarct. There is a small left frontal scalp hematoma. Bony calvarium appears intact. The mastoid air cells are clear. There is debris in the external auditory canal on the left.  IMPRESSION: No intracranial mass, hemorrhage, or extra-axial fluid. Gray-white compartments appear normal. Probable cerumen in the left external auditory canal.   Electronically Signed   By: Bretta Bang M.D.   On: 11/30/2013 14:34   Ct Cervical Spine Wo Contrast  12/02/2013   CLINICAL DATA:  Fall.  Evaluate for injury.  EXAM: CT CERVICAL, THORACIC, AND LUMBAR SPINE WITHOUT CONTRAST  TECHNIQUE: Multidetector CT imaging of the cervical, thoracic and lumbar spine was performed without intravenous contrast. Multiplanar CT image reconstructions were also generated.  COMPARISON:  CT cervical spine 08/08/2013 and CT chest 09/19/2012. CT pelvis 11/29/2013.  FINDINGS: CT CERVICAL SPINE FINDINGS  There is straightening of the normal cervical lordosis. Slight flattening of the C5 superior endplate is new from 08/08/2013. Vertebral body height is otherwise maintained and unchanged. Advanced degenerative disc disease at C3-4 with marked loss of disc space height. Neural foramina appear widely patent. Visualized portions of the intracranial contents show no acute findings. Soft tissues are unremarkable.  CT THORACIC SPINE FINDINGS  Alignment is anatomic, including at the  cervicothoracic junction. Vertebral body height is maintained. Scattered endplate degenerative changes. Visualized portions of the ribs show no fracture. Visualized portions of the lungs shows scattered subsegmental atelectasis or scarring. No pleural fluid.  CT LUMBAR SPINE FINDINGS  Straightening of the normal lumbar lordosis. Approximately 12 mm anterolisthesis of L5 on S1 with severe degenerative disc disease and obliteration of the joint space, as on 11/29/2013. There are chronic bilateral L5 pars defects. Vertebral body height and alignment are otherwise maintained. Endplate degenerative changes and prominent Schmorl's nodes are seen of the thoracolumbar junction as well as at L1-2 and L2-3.  Visualized portions of the kidneys and retroperitoneum show no acute findings. Bladder is distended.  IMPRESSION: 1. C5 superior endplate appears slightly compressed when compared with 08/08/2013. Otherwise, no evidence of an acute fracture or subluxation. 2. Multilevel spondylosis. 3. Grade 2 anterolisthesis of L5 on S1, secondary to chronic bilateral pars defects, with advanced secondary degenerative disc disease.   Electronically Signed   By: Leanna Battles M.D.   On: 12/02/2013 17:46   Ct Cervical Spine W Contrast  12/04/2013   CLINICAL DATA:  Cervical myelopathy. Lower extremity weakness. Spasticity.  EXAM: CT MYELOGRAPHY CERVICAL AND THORACIC SPINE  TECHNIQUE: CT imaging of the cervical and thoracic spine was performed after intrathecal contrast administration. Multiplanar CT image reconstructions were also generated.  COMPARISON:  CT cervical and lumbar spine 12/02/2013  FINDINGS: CT myelogram cervical spine:  Lumbar puncture and injection of contrast by Dr. Tyron Russell. The patient was not able to get into the magnet for MRI.  Mild retrolisthesis at C3-4 secondary to disc and facet degeneration. Negative for fracture or mass. No bony destruction.  C2-3:  Small central disc protrusion without cord deformity  C3-4:  Marked disc degeneration with disc space narrowing endplate sclerosis and endplate cystic changes. Cervical spondylosis with diffuse uncinate spurring. Bilateral facet hypertrophy. There is moderate spinal stenosis with flattening of the cord. There is foraminal encroachment bilaterally secondary to spurring.  C4-5: Diffuse disc degeneration with a left paracentral disc protrusion  causing mild flattening of the cord. Mild spinal stenosis. Neural foramina widely patent.  C5-6:  Small central disc protrusion with mild spinal stenosis noted  C6-7: Small central disc protrusion without significant spinal stenosis  C7-T1:  Negative  CT myelogram thoracic spine:  Negative for fracture or mass. No cord compression. Spinal cord morphology is normal and no mass lesion is identified in the cord.  Diffuse thoracic disc degeneration with disc space narrowing in the mid and lower thoracic spine. This is most severe at T12-L1 and L1-2 were there is marked disc space narrowing, endplate sclerosis and cystic change. There is mild osteophyte at T12-L1 and L1-2 without significant spinal stenosis or cord deformity. No disc protrusion identified.  IMPRESSION: Cervical spondylosis most severe at C3-4 where there is moderate spinal stenosis and foraminal encroachment bilaterally. There is a left paracentral disc protrusion at C4-5 with mild spinal stenosis. Mild spinal stenosis at C5-6 also noted.  Thoracic degenerative changes most prominent at T12-L1 and L1-2. No cord compression or significant thoracic spinal stenosis.   Electronically Signed   By: Marlan Palau M.D.   On: 12/04/2013 14:19   Ct Thoracic Spine Wo Contrast  12/02/2013   CLINICAL DATA:  Fall.  Evaluate for injury.  EXAM: CT CERVICAL, THORACIC, AND LUMBAR SPINE WITHOUT CONTRAST  TECHNIQUE: Multidetector CT imaging of the cervical, thoracic and lumbar spine was performed without intravenous contrast. Multiplanar CT image reconstructions were also generated.   COMPARISON:  CT cervical spine 08/08/2013 and CT chest 09/19/2012. CT pelvis 11/29/2013.  FINDINGS: CT CERVICAL SPINE FINDINGS  There is straightening of the normal cervical lordosis. Slight flattening of the C5 superior endplate is new from 08/08/2013. Vertebral body height is otherwise maintained and unchanged. Advanced degenerative disc disease at C3-4 with marked loss of disc space height. Neural foramina appear widely patent. Visualized portions of the intracranial contents show no acute findings. Soft tissues are unremarkable.  CT THORACIC SPINE FINDINGS  Alignment is anatomic, including at the cervicothoracic junction. Vertebral body height is maintained. Scattered endplate degenerative changes. Visualized portions of the ribs show no fracture. Visualized portions of the lungs shows scattered subsegmental atelectasis or scarring. No pleural fluid.  CT LUMBAR SPINE FINDINGS  Straightening of the normal lumbar lordosis. Approximately 12 mm anterolisthesis of L5 on S1 with severe degenerative disc disease and obliteration of the joint space, as on 11/29/2013. There are chronic bilateral L5 pars defects. Vertebral body height and alignment are otherwise maintained. Endplate degenerative changes and prominent Schmorl's nodes are seen of the thoracolumbar junction as well as at L1-2 and L2-3.  Visualized portions of the kidneys and retroperitoneum show no acute findings. Bladder is distended.  IMPRESSION: 1. C5 superior endplate appears slightly compressed when compared with 08/08/2013. Otherwise, no evidence of an acute fracture or subluxation. 2. Multilevel spondylosis. 3. Grade 2 anterolisthesis of L5 on S1, secondary to chronic bilateral pars defects, with advanced secondary degenerative disc disease.   Electronically Signed   By: Leanna Battles M.D.   On: 12/02/2013 17:46   Ct Thoracic Spine W Contrast  12/04/2013   CLINICAL DATA:  Cervical myelopathy. Lower extremity weakness. Spasticity.  EXAM: CT  MYELOGRAPHY CERVICAL AND THORACIC SPINE  TECHNIQUE: CT imaging of the cervical and thoracic spine was performed after intrathecal contrast administration. Multiplanar CT image reconstructions were also generated.  COMPARISON:  CT cervical and lumbar spine 12/02/2013  FINDINGS: CT myelogram cervical spine:  Lumbar puncture and injection of contrast by Dr. Tyron Russell. The patient was not able  to get into the magnet for MRI.  Mild retrolisthesis at C3-4 secondary to disc and facet degeneration. Negative for fracture or mass. No bony destruction.  C2-3:  Small central disc protrusion without cord deformity  C3-4: Marked disc degeneration with disc space narrowing endplate sclerosis and endplate cystic changes. Cervical spondylosis with diffuse uncinate spurring. Bilateral facet hypertrophy. There is moderate spinal stenosis with flattening of the cord. There is foraminal encroachment bilaterally secondary to spurring.  C4-5: Diffuse disc degeneration with a left paracentral disc protrusion causing mild flattening of the cord. Mild spinal stenosis. Neural foramina widely patent.  C5-6:  Small central disc protrusion with mild spinal stenosis noted  C6-7: Small central disc protrusion without significant spinal stenosis  C7-T1:  Negative  CT myelogram thoracic spine:  Negative for fracture or mass. No cord compression. Spinal cord morphology is normal and no mass lesion is identified in the cord.  Diffuse thoracic disc degeneration with disc space narrowing in the mid and lower thoracic spine. This is most severe at T12-L1 and L1-2 were there is marked disc space narrowing, endplate sclerosis and cystic change. There is mild osteophyte at T12-L1 and L1-2 without significant spinal stenosis or cord deformity. No disc protrusion identified.  IMPRESSION: Cervical spondylosis most severe at C3-4 where there is moderate spinal stenosis and foraminal encroachment bilaterally. There is a left paracentral disc protrusion at C4-5 with  mild spinal stenosis. Mild spinal stenosis at C5-6 also noted.  Thoracic degenerative changes most prominent at T12-L1 and L1-2. No cord compression or significant thoracic spinal stenosis.   Electronically Signed   By: Marlan Palau M.D.   On: 12/04/2013 14:19   Ct Lumbar Spine Wo Contrast  12/02/2013   CLINICAL DATA:  Fall.  Evaluate for injury.  EXAM: CT CERVICAL, THORACIC, AND LUMBAR SPINE WITHOUT CONTRAST  TECHNIQUE: Multidetector CT imaging of the cervical, thoracic and lumbar spine was performed without intravenous contrast. Multiplanar CT image reconstructions were also generated.  COMPARISON:  CT cervical spine 08/08/2013 and CT chest 09/19/2012. CT pelvis 11/29/2013.  FINDINGS: CT CERVICAL SPINE FINDINGS  There is straightening of the normal cervical lordosis. Slight flattening of the C5 superior endplate is new from 08/08/2013. Vertebral body height is otherwise maintained and unchanged. Advanced degenerative disc disease at C3-4 with marked loss of disc space height. Neural foramina appear widely patent. Visualized portions of the intracranial contents show no acute findings. Soft tissues are unremarkable.  CT THORACIC SPINE FINDINGS  Alignment is anatomic, including at the cervicothoracic junction. Vertebral body height is maintained. Scattered endplate degenerative changes. Visualized portions of the ribs show no fracture. Visualized portions of the lungs shows scattered subsegmental atelectasis or scarring. No pleural fluid.  CT LUMBAR SPINE FINDINGS  Straightening of the normal lumbar lordosis. Approximately 12 mm anterolisthesis of L5 on S1 with severe degenerative disc disease and obliteration of the joint space, as on 11/29/2013. There are chronic bilateral L5 pars defects. Vertebral body height and alignment are otherwise maintained. Endplate degenerative changes and prominent Schmorl's nodes are seen of the thoracolumbar junction as well as at L1-2 and L2-3.  Visualized portions of the  kidneys and retroperitoneum show no acute findings. Bladder is distended.  IMPRESSION: 1. C5 superior endplate appears slightly compressed when compared with 08/08/2013. Otherwise, no evidence of an acute fracture or subluxation. 2. Multilevel spondylosis. 3. Grade 2 anterolisthesis of L5 on S1, secondary to chronic bilateral pars defects, with advanced secondary degenerative disc disease.   Electronically Signed   By: Juliette Alcide  Blietz M.D.   On: 12/02/2013 17:46   Ct Pelvis Wo Contrast  11/29/2013   CLINICAL DATA:  Left hip pain after falling from bed. Mentally challenged.  EXAM: CT PELVIS WITHOUT CONTRAST  TECHNIQUE: Multidetector CT imaging of the pelvis was performed following the standard protocol without intravenous contrast.  COMPARISON:  Radiographs 12/01/2003 and 11/29/2013.  FINDINGS: There are apparent bilateral hip contractures. The utilized thin section technique is suboptimal for the patient's body habitus and these contractures, resulting in decreased signal to noise.  There is no evidence of acute fracture or dislocation. There is no evidence of femoral head avascular necrosis. 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  There is a grade 2 anterolisthesis at L5-S1 secondary to bilateral L5 pars defects. There is severe degenerative disc disease at L5-S1 with endplate sclerosis and vacuum phenomenon. There is severe biforaminal stenosis and probable bilateral L5 nerve root encroachment.  No periarticular hematoma is identified. Central prostatic calcifications and moderate bladder distention are noted. The visualized internal pelvic contents are otherwise unremarkable.  IMPRESSION: 1. No demonstrated acute osseous findings in the pelvis. 2. 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 3. Bilateral L5 pars defects with resulting grade 2 anterolisthesis and severe biforaminal stenosis at L5-S1. There is  probable bilateral L5 nerve root encroachment. 4. Central prostatic calcifications.   Electronically Signed   By: Roxy HorsemanBill  Veazey M.D.   On: 11/29/2013 02:14   Dg Myelography Lumbar Inj Multi Region  12/05/2013   CLINICAL DATA:  BILATERAL lower extremity weakness, hyperreflexia with up going toes, question cord compression, cervical spinal stenosis on noncontrast CT, inability to have MRI  FLUOROSCOPY TIME:  dictate in minutes and seconds  PROCEDURE: LUMBAR PUNCTURE FOR CERVICAL AND THORACIC MYELOGRAM  After thorough discussion of risks and benefits of the procedure including bleeding, infection, injury to nerves, blood vessels, adjacent structures as well as headache and CSF leak, written and oral informed consent was obtained. Timeout protocol followed.  Due to patient inability to extend hips and lie prone, patient was placed in a RIGHT lower decubitus position. Patient prepped and draped in usual sterile fashion. Local anesthesia was provided with 3 mL of 1% lidocaine without epinephrine. Lumbar puncture was performed at L3-L4 using a 3 1/2 inch 22-gauge spinal needle via a midline approach. Using a single pass through the dura, the needle was placed within the thecal sac, with return of clear colorless CSF. Opening pressure 18 cm H2O. 8 mL of CSF was obtained for requested laboratory analysis. 10 mL of Omnipaque-300 was injected into the thecal sac, with normal opacification of the nerve roots and cauda equina consistent with free flow within the subarachnoid space. The patient was then moved to the Trendelenburg position and contrast flowed into the thoracic and cervical spine.  I personally performed the lumbar puncture and administered the intrathecal contrast.  TECHNIQUE: Contiguous axial images were obtained through the Cervical and Thoracic spine after the intrathecal infusion of infusion. Coronal and sagittal reconstructions were obtained of the axial image sets. Cervical and thoracic CT myelography  images are reported separately.  FINDINGS: Free flow of contrast was identified from the the injection site at the lumbar spine to the cervical spine.  Indentation of the ventral aspect of the thecal sac is identified at the L1-L2 level by bulging disc and endplate spur formation, please refer to CT.  Grade 2 anterolisthesis L5-S1.  No immediate complications.  IMPRESSION: Lumbar puncture and contrast injection for cervical and thoracic CT myelography as above.   Electronically Signed   By: Ulyses Southward M.D.   On: 12/05/2013 08:19    CBC  Recent Labs Lab 11/30/13 1250 12/02/13 1234 12/03/13 0543  WBC 10.0 11.1* 9.4  HGB 12.6* 11.7* 11.8*  HCT 38.7* 37.0* 37.0*  PLT 238 237 217  MCV 99.0 100.0 101.6*  MCH 32.2 31.6 32.4  MCHC 32.6 31.6 31.9  RDW 14.7 14.9 14.9  LYMPHSABS 1.9 2.1  --   MONOABS 0.9 1.0  --   EOSABS 0.2 0.1  --   BASOSABS 0.0 0.0  --     Chemistries   Recent Labs Lab 11/30/13 1250 12/02/13 1234 12/03/13 0543 12/04/13 0717 12/05/13 0556  NA 148* 150* 150* 147 148*  K 4.4 4.4 4.7 5.1 5.5*  CL 112 114* 116* 113* 114*  CO2 26 26 24 25 24   GLUCOSE 143* 92 72 90 157*  BUN 24* 23 19 16 21   CREATININE 1.28 1.35 1.21 1.13 1.19  CALCIUM 9.2 8.9 8.8 8.8 9.0  AST 39*  --  66*  --   --   ALT 29  --  41  --   --   ALKPHOS 117  --  106  --   --   BILITOT <0.2*  --  0.3  --   --    ------------------------------------------------------------------------------------------------------------------ estimated creatinine clearance is 74 ml/min (by C-G formula based on Cr of 1.19). ------------------------------------------------------------------------------------------------------------------ No results found for this basename: HGBA1C,  in the last 72 hours ------------------------------------------------------------------------------------------------------------------ No results found for this basename: CHOL, HDL, LDLCALC, TRIG, CHOLHDL, LDLDIRECT,  in the last 72  hours ------------------------------------------------------------------------------------------------------------------ No results found for this basename: TSH, T4TOTAL, FREET3, T3FREE, THYROIDAB,  in the last 72 hours ------------------------------------------------------------------------------------------------------------------ No results found for this basename: VITAMINB12, FOLATE, FERRITIN, TIBC, IRON, RETICCTPCT,  in the last 72 hours  Coagulation profile No results found for this basename: INR, PROTIME,  in the last 168 hours  No results found for this basename: DDIMER,  in the last 72 hours  Cardiac Enzymes  Recent Labs Lab 12/03/13 0543  CKMB <1.0   ------------------------------------------------------------------------------------------------------------------ No components found with this basename: POCBNP,     Amrit Erck D.O. on 12/06/2013 at 12:02 PM  Between 7am to 7pm - Pager - [phone]  After 7pm go to [email or web address] - password [email or web address]  And look for the night coverage person covering for me after hours  [facility] Office  [phone]

## 2013-12-06 NOTE — Progress Notes (Signed)
Physical Therapy Treatment Patient Details Name: Joshua BishopMichael D Cummings MRN: [id number] DOB: 23-Apr-1966 Today's Date: 12/06/2013    History of Present Illness [name] is a 48 y.o. male with a history of developmental disability, obstructive sleep apnea, COPD, and tobacco abuse. He also has a history of acute respiratory failure with hypoxia secondary to obstructive sleep apnea and opiate-induced altered mentation during the hospitalization in May of 2014. Prior to his recent ER visits, he had been ambulating with a walker with no difficulty per history. The patient initially presented to the emergency department on 11/28/2013 with a fall. Apparently, at that time, he rolled out of bed and fell on the floor. He complained of cramping in his left thigh. X-rays at that time revealed no acute fractures. He was given Flexeril and then discharged back to [facility] assisted living. Apparently, he was instructed to ambulate via a wheelchair. He presented again on 7/25 and 7/26 for recurrent falls from his wheelchair. More x-rays were ordered and revealed no acute fractures. CT of his head was negative. He returns again today for the same.  He reports falling asleep in the wheelchair and falling on the floor. He does not recall passing out. He has no history of seizures. He denies any prior headache, chest pain, palpitations, shortness of breath, fever, chills, nausea, vomiting, or diarrhea. He complains of severe cramping pain in both of his legs mostly over his thighs. It is so painful that he cannot walk. He also cannot straighten out his legs without pain being excruciating. It radiates to his groin. He denies neck pain or lower back pain.   CT revela sprobable fracture in low thoracic and upper lumbar spine and anterolisthesis of L5 on S1; imaging difficulty due to flexed position.  Pt possibly being sent for myelogram of spine per neurology.       PT Comments    Patient with great difficulty with mobility  due to BUE and BLE involvement.  Noted decreased strength and extensor tone in BUE's, and decreased strength and flexor tone BLE's.  Patient requiring +2 total assist for mobility and attempts at standing.  Recommend lift equipment for OOB.  Agree with need for SNF at discharge.   Follow Up Recommendations  SNF     Equipment Recommendations  Other (comment) (TBD )    Recommendations for Other Services       Precautions / Restrictions Precautions Precautions: Fall Precaution Comments: h/o mult falls at  ALF Restrictions Weight Bearing Restrictions: No    Mobility  Bed Mobility                  Transfers Overall transfer level: Needs assistance Equipment used: Rolling walker (2 wheeled) Transfers: Sit to/from Stand Sit to Stand: Total assist;+2 physical assistance         General transfer comment: Assist required to have patient lean forward in recliner to get feet to floor.  Difficulty with sitting balance at edge of chair, requiring mod assist.  Assist to place feet in position for standing attempts.  Noted patient keeps RLE flexed and externally rotated.  LLE externally rotated with Lt foot crossing Rt foot.  Placed feet flat with support given to maintain position.  Cues to place hands on armrest of chair to push up to standing.  Patient with difficulty positioning UE's due to increased extensor tone and weakness.  Max assist to place and support hands.  Attempted standing from chair with +2 total assist.  Patient unable  to clear hips from chair.  Assisted patient to scoot back in to chair with +2 total assist.  Ambulation/Gait                 Stairs            Wheelchair Mobility    Modified Rankin (Stroke Patients Only)       Balance Overall balance assessment: Needs assistance Sitting-balance support: Bilateral upper extremity supported;Feet supported Sitting balance-Leahy Scale: Poor Sitting balance - Comments: Patient requiring mod assist to  maintain balance at edge of chair.  Difficulty using UE's to assist. Postural control: Posterior lean                          Cognition Arousal/Alertness: Awake/alert Behavior During Therapy: WFL for tasks assessed/performed;Impulsive Overall Cognitive Status: History of cognitive impairments - at baseline                      Exercises      General Comments        Pertinent Vitals/Pain     Home Living                      Prior Function            PT Goals (current goals can now be found in the care plan section) Progress towards PT goals: Progressing toward goals    Frequency  Min 3X/week    PT Plan Current plan remains appropriate    Co-evaluation             End of Session Equipment Utilized During Treatment: Gait belt;Oxygen Activity Tolerance: Patient limited by fatigue Patient left: in chair;with call bell/phone within reach;with chair alarm set (with lift pad in place)     Time: 1610-9604 PT Time Calculation (min): 9 min  Charges:  $Therapeutic Activity: 8-22 mins                    G Codes:      [name] 12/26/13, 12:01 PM [name], [facility] Acute Rehab Services Pager [phone]

## 2013-12-07 LAB — BASIC METABOLIC PANEL
Anion gap: 11 (ref 5–15)
BUN: 32 mg/dL — AB (ref 6–23)
CO2: 26 meq/L (ref 19–32)
Calcium: 8.8 mg/dL (ref 8.4–10.5)
Chloride: 108 mEq/L (ref 96–112)
Creatinine, Ser: 1.12 mg/dL (ref 0.50–1.35)
GFR calc Af Amer: 89 mL/min — ABNORMAL LOW (ref >90)
GFR calc non Af Amer: 77 mL/min — ABNORMAL LOW (ref >90)
GLUCOSE: 167 mg/dL — AB (ref 70–99)
POTASSIUM: 5 meq/L (ref 3.7–5.3)
SODIUM: 145 meq/L (ref 137–147)

## 2013-12-07 LAB — CBC
HEMATOCRIT: 34.8 % — AB (ref 39.0–52.0)
Hemoglobin: 11.1 g/dL — ABNORMAL LOW (ref 13.0–17.0)
MCH: 32 pg (ref 26.0–34.0)
MCHC: 31.9 g/dL (ref 30.0–36.0)
MCV: 100.3 fL — ABNORMAL HIGH (ref 78.0–100.0)
Platelets: 248 10*3/uL (ref 150–400)
RBC: 3.47 MIL/uL — ABNORMAL LOW (ref 4.22–5.81)
RDW: 14.2 % (ref 11.5–15.5)
WBC: 7.4 10*3/uL (ref 4.0–10.5)

## 2013-12-07 NOTE — Progress Notes (Signed)
Overall stable. Awaiting MRI scan on Monday. No new recommendations.

## 2013-12-07 NOTE — Progress Notes (Signed)
[facility]                                                                              Patient Demographics  [name], is a 48 y.o. male, DOB - 1965-09-17, ZOX:[id number]  Admit date - 12/02/2013   Admitting Physician Dewayne Shorter Levora Dredge, MD  Outpatient Primary MD for the patient is Default, Provider, MD  LOS - 5   Chief Complaint  Patient presents with  . Fall      HPI on 12/02/2013 [name] is a 48 y.o. male with a history of developmental disability, obstructive sleep apnea, COPD, and tobacco abuse. He also has a history of acute respiratory failure with hypoxia secondary to obstructive sleep apnea and opiate-induced altered mentation during the hospitalization in May of 2014. Prior to his recent ER visits, he had been ambulating with a walker with no difficulty per history. The patient initially presented to the emergency department on 11/28/2013 with a fall. Apparently, at that time, he rolled out of bed and fell on the floor. He complained of cramping in his left thigh. X-rays at that time revealed no acute fractures. He was given Flexeril and then discharged back to [facility] assisted living. Apparently, he was instructed to ambulate via a wheelchair. He presented again on 7/25 and 7/26 for recurrent falls from his wheelchair. More x-rays were ordered and revealed no acute fractures. CT of his head was negative. He returned on 12/02/2013 for the same issues. He reported falling asleep in the wheelchair and falling on the floor. He does not recall passing out. He has no history of seizures. He denied any prior headache, chest pain, palpitations, shortness of breath, fever, chills, nausea, vomiting, or diarrhea. He complained of severe cramping pain in both of his legs mostly over his thighs, and so painful that he cannot walk. He also cannot straighten out his legs without pain being excruciating. It radiated to his groin. He denied neck pain or lower back pain.  In the ED,  he was mildly tachycardic, otherwise hemodynamically stable. He was afebrile. His lab data are significant for total CK of 2997, WBC of 11.1, hemoglobin 11.7, and sodium of 150. X-ray of his lumbar spine reveals probable fractures in the lower thoracic and upper lumbar region. He was admitted for further evaluation and management.  Interim history Patient was sent to [facility] for further evaluation by neurosurgery. Patient was to have an MRI of the lumbar spine.  Assessment & Plan   Multiple falls with gait disorder, possibly related to cervical myelopathy -Patient had multiple falls at assisted living, had also fallen out of his wheelchair, and continues to complain of lower extremity pain and spasms. Patient attributes his falls his obstructive sleep apnea. -MRI was attempted upon admission however patient was unable to tolerate it. -CT of the spine was ordered and showed severe degenerative changes with spondylosis throughout the spine. There was no indication of cord impingement or compromise on initial noncontrast CT. -CT of the head was negative on admission -Neurology at [facility], Dr. Gerilyn Pilgrim was consulted and believed the patient had a cervical myelopathy -MRI of the cervical spine was ordered, patient was unable  to lie flat, CT myelogram was ordered by neurology, findings are indicative of severe canal stenosis with encroachment on the spinal cord at C3-C4.  -Patient was [facility] -Neurosurgery, Dr.Nundkumar was consulted and recommended MRI of the lumbar spine as well as anti-spasmodic medications for his leg cramps and spasms -EEG was refused by patient -Patient was seen by physical therapy at [facility] and SNF was recommended -MRI lumbar spine OB/GYN on Monday, 12/09/2013 under anesthesia.  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  Mild rhabdomyolysis -Likely secondary to falls -CPK levels have normalized  Dehydration with hypernatremia -Patient has a history of this. Denied any nausea or vomiting or diarrhea -Was placed on half-normal saline on admission and transitioned to dextrose infusion -Hypernatremia, resolved  Obstructive sleep apnea -Continue CPAP however patient's daughter declined  COPD with tobacco abuse -Continue inhalers -Patient declines nicotine patch -Smoking cessation counseling given  Mild Leukocytosis -Resolved, Likely secondary to stress reaction versus steroids -No evidence of active infection  Macrocytic Anemia -Hemoglobin remained stable at 11.8 -Anemia panel was normal last year -Thiamine B12 and TSH are within normal limits  Developmental disability -Stable  Sinus tachycardia -Likely secondary to pain, TSH normal  Code Status: Full  Family Communication: None at bedside  Disposition Plan: Admitted, pending further workup  Time Spent in minutes   20 minutes  Procedures  None  Consults   Neurology Neurosurgery  DVT Prophylaxis  Heparin  Lab Results  Component Value Date   PLT 248 12/07/2013    Medications  Scheduled Meds: . budesonide  0.25 mg Nebulization BID  . diazepam  10 mg Oral QID  . heparin subcutaneous  5,000 Units Subcutaneous 3 times per day  . methylPREDNISolone (SOLU-MEDROL) injection  1,000 mg Intravenous Q24H  . senna  1 tablet Oral QHS   Continuous Infusions: . dextrose 75 mL/hr at 12/06/13 2131   PRN Meds:.acetaminophen, acetaminophen, albuterol, alum & mag hydroxide-simeth, guaiFENesin-dextromethorphan, HYDROcodone-acetaminophen, morphine injection, ondansetron (ZOFRAN) IV,  ondansetron  Antibiotics    Anti-infectives   None      Subjective:   [name] seen and examined today.  Patient continues to complain of pain. He is unable to straighten out his legs. Patient states he is tired. Patient denies any shortness of breath, headaches, dizziness, chest pain, abdominal pain.  Objective:   Filed Vitals:   12/06/13 2124 12/07/13 0126 12/07/13 0500 12/07/13 0541  BP: 133/78 139/65  138/83  Pulse: 93 89  91  Temp: 98.2 F (36.8 C) 98.2 F (36.8 C)  98.1 F (36.7 C)  TempSrc: Axillary Axillary  Axillary  Resp: 16 16  18   Height:      Weight:   87.227 kg (192 lb 4.8 oz)   SpO2: 91% 92%  93%    Wt Readings from Last 3 Encounters:  12/07/13 87.227 kg (192 lb 4.8 oz)  12/01/13 90.719 kg (200 lb)  11/30/13 90.719 kg (200 lb)     Intake/Output Summary (Last 24 hours) at 12/07/13 1100 Last data filed at 12/07/13 0550  Gross per 24 hour  Intake    960 ml  Output   2750 ml  Net  -1790 ml  Exam  General: Well developed, well nourished, NAD, appears stated age  HEENT: [place], abrasions on the frontal scalp, nasal bridge, forehead,  mucous membranes moist.   Cardiovascular: S1 S2 auscultated, no rubs, murmurs or gallops. Regular rate and rhythm.  Respiratory: Clear to auscultation bilaterally with equal chest rise  Abdomen: Soft, nontender, nondistended, + bowel sounds  Extremities: warm dry without cyanosis clubbing. Trace edema in lower extremities bilaterally.  Neuro: AAOx3, no new focal deficits. Patient will not extend his legs.  Data Review   Micro Results No results found for this or any previous visit (from the past 240 hour(s)).  Radiology Reports Dg Lumbar Spine 2-3 Views  12/02/2013   CLINICAL DATA:  Pain post trauma  EXAM: LUMBAR SPINE - 2-3 VIEW  COMPARISON:  Report of prior lumbar series June 18, 2011 available ; images from that study cannot be retrieved at this time.  FINDINGS: Frontal and lateral views were  obtained. There are 5 non-rib-bearing lumbar type vertebral bodies. There is lumbar levoscoliosis. There is remodeling of the T11, T12, L1, and L2 vertebral bodies with what appears to be chronic wedging at these levels. No acute appearing fracture is seen. There is 1.7 cm of anterolisthesis of L5 on S1. No other spondylolisthesis is seen.  There is moderately severe disc space narrowing at all levels. No erosive change.  IMPRESSION: Probable fractures in the lower thoracic and upper lumbar region. 1.7 cm of anterolisthesis of L5 on S1. By report, there was 1.1 cm of anterolisthesis of L5 on S1 on the previous study. Suspect progression of arthropathy as the cause of the increase in spondylolisthesis, although traumatic etiology cannot be excluded. There is scoliosis as well as multilevel arthropathy.   Electronically Signed   By: Bretta Bang M.D.   On: 12/02/2013 09:47   Dg Pelvis 1-2 Views  11/30/2013   CLINICAL DATA:  Pelvic pain.  EXAM: PELVIS - 1-2 VIEW  COMPARISON:  None.  FINDINGS: Please note that the patient was very combative and refused to comply with the examination. A limited view of the pelvis was obtained.  No acute bony abnormalities are noted.  Degenerative changes in the lower lumbar spine a present.  Please note that the inferior portion the pelvis left hip are off the field of view.  IMPRESSION: Limited evaluation secondary to patient's inability to comply with examination. No acute bony abnormalities noted.   Electronically Signed   By: Laveda Abbe M.D.   On: 11/30/2013 14:49   Dg Hip Complete Left  12/02/2013   CLINICAL DATA:  Left hip pain status post fall  EXAM: LEFT HIP - COMPLETE 2+ VIEW  COMPARISON:  None.  FINDINGS: There is no obvious hip fracture or dislocation. There is no lytic or blastic osseous lesion. Examination is limited secondary to difficulty in patient positioning secondary to pain.  Degenerative changes of the lower lumbar spine.  IMPRESSION: 1. Limited exam  secondary to suboptimal positioning. No evidence of acute fracture or dislocation. If there is persistent clinical concern, and patient is unable to tolerate satisfactory positioning further evaluation with MRI may be helpful.   Electronically Signed   By: Elige Ko   On: 12/02/2013 09:45   Dg Hip Complete Left  11/28/2013   CLINICAL DATA:  Status post fall; left hip pain.  EXAM: LEFT HIP - COMPLETE 2+ VIEW  COMPARISON:  None.  FINDINGS: There is no evidence of fracture or dislocation. Both femoral heads are seated normally within their respective acetabula.  The proximal left femur appears intact. No significant degenerative change is appreciated. The sacroiliac joints are unremarkable in appearance. An apparent loose body is noted at the superior rim of the left acetabulum. Alternatively, this could reflect a small avulsion fracture.  The visualized bowel gas pattern is grossly unremarkable in appearance.  IMPRESSION: 1. No definite evidence of fracture or dislocation. 2. Apparent loose body noted at the superior rim of the left acetabulum. Alternatively, this could reflect a small avulsion fracture.   Electronically Signed   By: Roanna Raider M.D.   On: 11/28/2013 04:08   Dg Femur Left  11/30/2013   CLINICAL DATA:  Fall, combative, confusion, groin pain  EXAM: LEFT FEMUR - 2 VIEW  COMPARISON:  11/29/2013  FINDINGS: Limited exam because of positioning and body habitus. Proximal aspect of the left femur and hip region are obscured by soft tissues and overlapping shadows. No gross displaced fracture evident. Degenerative changes noted of the left knee joint. No definite soft tissue abnormality.  IMPRESSION: Very limited exam because of positioning.  Left knee tricompartmental osteoarthritis.   Electronically Signed   By: Ruel Favors M.D.   On: 11/30/2013 14:48   Dg Femur Left  11/29/2013   CLINICAL DATA:  Pain extending from the left hip to the knee after falling yesterday.  EXAM: LEFT FEMUR - 2 VIEW   COMPARISON:  Radiographs 12/01/2003.  FINDINGS: Stable coxa magna deformity. No evidence of acute fracture, dislocation or femoral head avascular necrosis. Os acetabuli appears unchanged. Tricompartmental degenerative changes are present at the knee, most advanced within the patellofemoral compartment.  IMPRESSION: No acute osseous findings demonstrated.   Electronically Signed   By: Roxy Horseman M.D.   On: 11/29/2013 02:01   Ct Head Wo Contrast  12/01/2013   CLINICAL DATA:  Headache and weakness. Status post fall out of bed. Skin tear at the mid forehead.  EXAM: CT HEAD WITHOUT CONTRAST  TECHNIQUE: Contiguous axial images were obtained from the base of the skull through the vertex without intravenous contrast.  COMPARISON:  CT of the head performed 11/30/2013  FINDINGS: There is no evidence of acute infarction, mass lesion, or intra- or extra-axial hemorrhage on CT.  The posterior fossa, including the cerebellum, brainstem and fourth ventricle, is within normal limits. The third and lateral ventricles, and basal ganglia are unremarkable in appearance. The cerebral hemispheres are symmetric in appearance, with normal gray-white differentiation. No mass effect or midline shift is seen.  There is no evidence of fracture; visualized osseous structures are unremarkable in appearance. Bilateral proptosis is noted. A small mucus retention cyst or polyp is noted at the left maxillary sinus. The remaining paranasal sinuses and mastoid air cells are well-aerated. Mild soft tissue swelling is noted overlying the left frontal calvarium.  IMPRESSION: 1. No evidence of traumatic intracranial injury or fracture. 2. Mild soft tissue swelling overlying the left frontal calvarium. 3. Bilateral proptosis noted. 4. Small mucus retention cyst or polyp at the left maxillary sinus.   Electronically Signed   By: Roanna Raider M.D.   On: 12/01/2013 04:58   Ct Head Wo Contrast  11/30/2013   CLINICAL DATA:  Pain post trauma ; altered  mental status  EXAM: CT HEAD WITHOUT CONTRAST  TECHNIQUE: Contiguous axial images were obtained from the base of the skull through the vertex without intravenous contrast.  COMPARISON:  August 08, 2013  FINDINGS: The ventricles are normal in size and configuration. There is a rather minimal cavum septum pellucidum, an anatomic  variant. There is no mass, hemorrhage, extra-axial fluid collection, or midline shift. Wallace Cullens and white compartments appear within normal limits. No apparent acute infarct. There is a small left frontal scalp hematoma. Bony calvarium appears intact. The mastoid air cells are clear. There is debris in the external auditory canal on the left.  IMPRESSION: No intracranial mass, hemorrhage, or extra-axial fluid. Gray-white compartments appear normal. Probable cerumen in the left external auditory canal.   Electronically Signed   By: Bretta Bang M.D.   On: 11/30/2013 14:34   Ct Cervical Spine Wo Contrast  12/02/2013   CLINICAL DATA:  Fall.  Evaluate for injury.  EXAM: CT CERVICAL, THORACIC, AND LUMBAR SPINE WITHOUT CONTRAST  TECHNIQUE: Multidetector CT imaging of the cervical, thoracic and lumbar spine was performed without intravenous contrast. Multiplanar CT image reconstructions were also generated.  COMPARISON:  CT cervical spine 08/08/2013 and CT chest 09/19/2012. CT pelvis 11/29/2013.  FINDINGS: CT CERVICAL SPINE FINDINGS  There is straightening of the normal cervical lordosis. Slight flattening of the C5 superior endplate is new from 08/08/2013. Vertebral body height is otherwise maintained and unchanged. Advanced degenerative disc disease at C3-4 with marked loss of disc space height. Neural foramina appear widely patent. Visualized portions of the intracranial contents show no acute findings. Soft tissues are unremarkable.  CT THORACIC SPINE FINDINGS  Alignment is anatomic, including at the cervicothoracic junction. Vertebral body height is maintained. Scattered endplate degenerative  changes. Visualized portions of the ribs show no fracture. Visualized portions of the lungs shows scattered subsegmental atelectasis or scarring. No pleural fluid.  CT LUMBAR SPINE FINDINGS  Straightening of the normal lumbar lordosis. Approximately 12 mm anterolisthesis of L5 on S1 with severe degenerative disc disease and obliteration of the joint space, as on 11/29/2013. There are chronic bilateral L5 pars defects. Vertebral body height and alignment are otherwise maintained. Endplate degenerative changes and prominent Schmorl's nodes are seen of the thoracolumbar junction as well as at L1-2 and L2-3.  Visualized portions of the kidneys and retroperitoneum show no acute findings. Bladder is distended.  IMPRESSION: 1. C5 superior endplate appears slightly compressed when compared with 08/08/2013. Otherwise, no evidence of an acute fracture or subluxation. 2. Multilevel spondylosis. 3. Grade 2 anterolisthesis of L5 on S1, secondary to chronic bilateral pars defects, with advanced secondary degenerative disc disease.   Electronically Signed   By: Leanna Battles M.D.   On: 12/02/2013 17:46   Ct Cervical Spine W Contrast  12/04/2013   CLINICAL DATA:  Cervical myelopathy. Lower extremity weakness. Spasticity.  EXAM: CT MYELOGRAPHY CERVICAL AND THORACIC SPINE  TECHNIQUE: CT imaging of the cervical and thoracic spine was performed after intrathecal contrast administration. Multiplanar CT image reconstructions were also generated.  COMPARISON:  CT cervical and lumbar spine 12/02/2013  FINDINGS: CT myelogram cervical spine:  Lumbar puncture and injection of contrast by Dr. Tyron Russell. The patient was not able to get into the magnet for MRI.  Mild retrolisthesis at C3-4 secondary to disc and facet degeneration. Negative for fracture or mass. No bony destruction.  C2-3:  Small central disc protrusion without cord deformity  C3-4: Marked disc degeneration with disc space narrowing endplate sclerosis and endplate cystic  changes. Cervical spondylosis with diffuse uncinate spurring. Bilateral facet hypertrophy. There is moderate spinal stenosis with flattening of the cord. There is foraminal encroachment bilaterally secondary to spurring.  C4-5: Diffuse disc degeneration with a left paracentral disc protrusion causing mild flattening of the cord. Mild spinal stenosis. Neural foramina widely patent.  C5-6:  Small central disc protrusion with mild spinal stenosis noted  C6-7: Small central disc protrusion without significant spinal stenosis  C7-T1:  Negative  CT myelogram thoracic spine:  Negative for fracture or mass. No cord compression. Spinal cord morphology is normal and no mass lesion is identified in the cord.  Diffuse thoracic disc degeneration with disc space narrowing in the mid and lower thoracic spine. This is most severe at T12-L1 and L1-2 were there is marked disc space narrowing, endplate sclerosis and cystic change. There is mild osteophyte at T12-L1 and L1-2 without significant spinal stenosis or cord deformity. No disc protrusion identified.  IMPRESSION: Cervical spondylosis most severe at C3-4 where there is moderate spinal stenosis and foraminal encroachment bilaterally. There is a left paracentral disc protrusion at C4-5 with mild spinal stenosis. Mild spinal stenosis at C5-6 also noted.  Thoracic degenerative changes most prominent at T12-L1 and L1-2. No cord compression or significant thoracic spinal stenosis.   Electronically Signed   By: Marlan Palau M.D.   On: 12/04/2013 14:19   Ct Thoracic Spine Wo Contrast  12/02/2013   CLINICAL DATA:  Fall.  Evaluate for injury.  EXAM: CT CERVICAL, THORACIC, AND LUMBAR SPINE WITHOUT CONTRAST  TECHNIQUE: Multidetector CT imaging of the cervical, thoracic and lumbar spine was performed without intravenous contrast. Multiplanar CT image reconstructions were also generated.  COMPARISON:  CT cervical spine 08/08/2013 and CT chest 09/19/2012. CT pelvis 11/29/2013.  FINDINGS:  CT CERVICAL SPINE FINDINGS  There is straightening of the normal cervical lordosis. Slight flattening of the C5 superior endplate is new from 08/08/2013. Vertebral body height is otherwise maintained and unchanged. Advanced degenerative disc disease at C3-4 with marked loss of disc space height. Neural foramina appear widely patent. Visualized portions of the intracranial contents show no acute findings. Soft tissues are unremarkable.  CT THORACIC SPINE FINDINGS  Alignment is anatomic, including at the cervicothoracic junction. Vertebral body height is maintained. Scattered endplate degenerative changes. Visualized portions of the ribs show no fracture. Visualized portions of the lungs shows scattered subsegmental atelectasis or scarring. No pleural fluid.  CT LUMBAR SPINE FINDINGS  Straightening of the normal lumbar lordosis. Approximately 12 mm anterolisthesis of L5 on S1 with severe degenerative disc disease and obliteration of the joint space, as on 11/29/2013. There are chronic bilateral L5 pars defects. Vertebral body height and alignment are otherwise maintained. Endplate degenerative changes and prominent Schmorl's nodes are seen of the thoracolumbar junction as well as at L1-2 and L2-3.  Visualized portions of the kidneys and retroperitoneum show no acute findings. Bladder is distended.  IMPRESSION: 1. C5 superior endplate appears slightly compressed when compared with 08/08/2013. Otherwise, no evidence of an acute fracture or subluxation. 2. Multilevel spondylosis. 3. Grade 2 anterolisthesis of L5 on S1, secondary to chronic bilateral pars defects, with advanced secondary degenerative disc disease.   Electronically Signed   By: Leanna Battles M.D.   On: 12/02/2013 17:46   Ct Thoracic Spine W Contrast  12/04/2013   CLINICAL DATA:  Cervical myelopathy. Lower extremity weakness. Spasticity.  EXAM: CT MYELOGRAPHY CERVICAL AND THORACIC SPINE  TECHNIQUE: CT imaging of the cervical and thoracic spine was  performed after intrathecal contrast administration. Multiplanar CT image reconstructions were also generated.  COMPARISON:  CT cervical and lumbar spine 12/02/2013  FINDINGS: CT myelogram cervical spine:  Lumbar puncture and injection of contrast by Dr. Tyron Russell. The patient was not able to get into the magnet for MRI.  Mild retrolisthesis at C3-4 secondary to disc and  facet degeneration. Negative for fracture or mass. No bony destruction.  C2-3:  Small central disc protrusion without cord deformity  C3-4: Marked disc degeneration with disc space narrowing endplate sclerosis and endplate cystic changes. Cervical spondylosis with diffuse uncinate spurring. Bilateral facet hypertrophy. There is moderate spinal stenosis with flattening of the cord. There is foraminal encroachment bilaterally secondary to spurring.  C4-5: Diffuse disc degeneration with a left paracentral disc protrusion causing mild flattening of the cord. Mild spinal stenosis. Neural foramina widely patent.  C5-6:  Small central disc protrusion with mild spinal stenosis noted  C6-7: Small central disc protrusion without significant spinal stenosis  C7-T1:  Negative  CT myelogram thoracic spine:  Negative for fracture or mass. No cord compression. Spinal cord morphology is normal and no mass lesion is identified in the cord.  Diffuse thoracic disc degeneration with disc space narrowing in the mid and lower thoracic spine. This is most severe at T12-L1 and L1-2 were there is marked disc space narrowing, endplate sclerosis and cystic change. There is mild osteophyte at T12-L1 and L1-2 without significant spinal stenosis or cord deformity. No disc protrusion identified.  IMPRESSION: Cervical spondylosis most severe at C3-4 where there is moderate spinal stenosis and foraminal encroachment bilaterally. There is a left paracentral disc protrusion at C4-5 with mild spinal stenosis. Mild spinal stenosis at C5-6 also noted.  Thoracic degenerative changes most  prominent at T12-L1 and L1-2. No cord compression or significant thoracic spinal stenosis.   Electronically Signed   By: Marlan Palau M.D.   On: 12/04/2013 14:19   Ct Lumbar Spine Wo Contrast  12/02/2013   CLINICAL DATA:  Fall.  Evaluate for injury.  EXAM: CT CERVICAL, THORACIC, AND LUMBAR SPINE WITHOUT CONTRAST  TECHNIQUE: Multidetector CT imaging of the cervical, thoracic and lumbar spine was performed without intravenous contrast. Multiplanar CT image reconstructions were also generated.  COMPARISON:  CT cervical spine 08/08/2013 and CT chest 09/19/2012. CT pelvis 11/29/2013.  FINDINGS: CT CERVICAL SPINE FINDINGS  There is straightening of the normal cervical lordosis. Slight flattening of the C5 superior endplate is new from 08/08/2013. Vertebral body height is otherwise maintained and unchanged. Advanced degenerative disc disease at C3-4 with marked loss of disc space height. Neural foramina appear widely patent. Visualized portions of the intracranial contents show no acute findings. Soft tissues are unremarkable.  CT THORACIC SPINE FINDINGS  Alignment is anatomic, including at the cervicothoracic junction. Vertebral body height is maintained. Scattered endplate degenerative changes. Visualized portions of the ribs show no fracture. Visualized portions of the lungs shows scattered subsegmental atelectasis or scarring. No pleural fluid.  CT LUMBAR SPINE FINDINGS  Straightening of the normal lumbar lordosis. Approximately 12 mm anterolisthesis of L5 on S1 with severe degenerative disc disease and obliteration of the joint space, as on 11/29/2013. There are chronic bilateral L5 pars defects. Vertebral body height and alignment are otherwise maintained. Endplate degenerative changes and prominent Schmorl's nodes are seen of the thoracolumbar junction as well as at L1-2 and L2-3.  Visualized portions of the kidneys and retroperitoneum show no acute findings. Bladder is distended.  IMPRESSION: 1. C5 superior  endplate appears slightly compressed when compared with 08/08/2013. Otherwise, no evidence of an acute fracture or subluxation. 2. Multilevel spondylosis. 3. Grade 2 anterolisthesis of L5 on S1, secondary to chronic bilateral pars defects, with advanced secondary degenerative disc disease.   Electronically Signed   By: Leanna Battles M.D.   On: 12/02/2013 17:46   Ct Pelvis Wo Contrast  11/29/2013  CLINICAL DATA:  Left hip pain after falling from bed. Mentally challenged.  EXAM: CT PELVIS WITHOUT CONTRAST  TECHNIQUE: Multidetector CT imaging of the pelvis was performed following the standard protocol without intravenous contrast.  COMPARISON:  Radiographs 12/01/2003 and 11/29/2013.  FINDINGS: There are apparent bilateral hip contractures. The utilized thin section technique is suboptimal for the patient's body habitus and these contractures, resulting in decreased signal to noise.  There is no evidence of acute fracture or dislocation. There is no evidence of femoral head avascular necrosis. 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  There is a grade 2 anterolisthesis at L5-S1 secondary to bilateral L5 pars defects. There is severe degenerative disc disease at L5-S1 with endplate sclerosis and vacuum phenomenon. There is severe biforaminal stenosis and probable bilateral L5 nerve root encroachment.  No periarticular hematoma is identified. Central prostatic calcifications and moderate bladder distention are noted. The visualized internal pelvic contents are otherwise unremarkable.  IMPRESSION: 1. No demonstrated acute osseous findings in the pelvis. 2. 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 3. Bilateral L5 pars defects with resulting grade 2 anterolisthesis and severe biforaminal stenosis at L5-S1. There is probable bilateral L5 nerve root encroachment. 4. Central prostatic calcifications.   Electronically Signed    By: Roxy Horseman M.D.   On: 11/29/2013 02:14   Dg Myelography Lumbar Inj Multi Region  12/05/2013   CLINICAL DATA:  BILATERAL lower extremity weakness, hyperreflexia with up going toes, question cord compression, cervical spinal stenosis on noncontrast CT, inability to have MRI  FLUOROSCOPY TIME:  dictate in minutes and seconds  PROCEDURE: LUMBAR PUNCTURE FOR CERVICAL AND THORACIC MYELOGRAM  After thorough discussion of risks and benefits of the procedure including bleeding, infection, injury to nerves, blood vessels, adjacent structures as well as headache and CSF leak, written and oral informed consent was obtained. Timeout protocol followed.  Due to patient inability to extend hips and lie prone, patient was placed in a RIGHT lower decubitus position. Patient prepped and draped in usual sterile fashion. Local anesthesia was provided with 3 mL of 1% lidocaine without epinephrine. Lumbar puncture was performed at L3-L4 using a 3 1/2 inch 22-gauge spinal needle via a midline approach. Using a single pass through the dura, the needle was placed within the thecal sac, with return of clear colorless CSF. Opening pressure 18 cm H2O. 8 mL of CSF was obtained for requested laboratory analysis. 10 mL of Omnipaque-300 was injected into the thecal sac, with normal opacification of the nerve roots and cauda equina consistent with free flow within the subarachnoid space. The patient was then moved to the Trendelenburg position and contrast flowed into the thoracic and cervical spine.  I personally performed the lumbar puncture and administered the intrathecal contrast.  TECHNIQUE: Contiguous axial images were obtained through the Cervical and Thoracic spine after the intrathecal infusion of infusion. Coronal and sagittal reconstructions were obtained of the axial image sets. Cervical and thoracic CT myelography images are reported separately.  FINDINGS: Free flow of contrast was identified from the the injection site at  the lumbar spine to the cervical spine.  Indentation of the ventral aspect of the thecal sac is identified at the L1-L2 level by bulging disc and endplate spur formation, please refer to CT.  Grade 2 anterolisthesis L5-S1.  No immediate complications.  IMPRESSION: Lumbar puncture and contrast injection for cervical and thoracic CT myelography as above.   Electronically  Signed   By: Ulyses Southward M.D.   On: 12/05/2013 08:19    CBC  Recent Labs Lab 11/30/13 1250 12/02/13 1234 12/03/13 0543 12/07/13 0455  WBC 10.0 11.1* 9.4 7.4  HGB 12.6* 11.7* 11.8* 11.1*  HCT 38.7* 37.0* 37.0* 34.8*  PLT 238 237 217 248  MCV 99.0 100.0 101.6* 100.3*  MCH 32.2 31.6 32.4 32.0  MCHC 32.6 31.6 31.9 31.9  RDW 14.7 14.9 14.9 14.2  LYMPHSABS 1.9 2.1  --   --   MONOABS 0.9 1.0  --   --   EOSABS 0.2 0.1  --   --   BASOSABS 0.0 0.0  --   --     Chemistries   Recent Labs Lab 11/30/13 1250 12/02/13 1234 12/03/13 0543 12/04/13 0717 12/05/13 0556 12/07/13 0455  NA 148* 150* 150* 147 148* 145  K 4.4 4.4 4.7 5.1 5.5* 5.0  CL 112 114* 116* 113* 114* 108  CO2 26 26 24 25 24 26   GLUCOSE 143* 92 72 90 157* 167*  BUN 24* 23 19 16 21  32*  CREATININE 1.28 1.35 1.21 1.13 1.19 1.12  CALCIUM 9.2 8.9 8.8 8.8 9.0 8.8  AST 39*  --  66*  --   --   --   ALT 29  --  41  --   --   --   ALKPHOS 117  --  106  --   --   --   BILITOT <0.2*  --  0.3  --   --   --    ------------------------------------------------------------------------------------------------------------------ estimated creatinine clearance is 78 ml/min (by C-G formula based on Cr of 1.12). ------------------------------------------------------------------------------------------------------------------ No results found for this basename: HGBA1C,  in the last 72 hours ------------------------------------------------------------------------------------------------------------------ No results found for this basename: CHOL, HDL, LDLCALC, TRIG, CHOLHDL,  LDLDIRECT,  in the last 72 hours ------------------------------------------------------------------------------------------------------------------ No results found for this basename: TSH, T4TOTAL, FREET3, T3FREE, THYROIDAB,  in the last 72 hours ------------------------------------------------------------------------------------------------------------------ No results found for this basename: VITAMINB12, FOLATE, FERRITIN, TIBC, IRON, RETICCTPCT,  in the last 72 hours  Coagulation profile No results found for this basename: INR, PROTIME,  in the last 168 hours  No results found for this basename: DDIMER,  in the last 72 hours  Cardiac Enzymes  Recent Labs Lab 12/03/13 0543  CKMB <1.0   ------------------------------------------------------------------------------------------------------------------ No components found with this basename: POCBNP,     Prescilla Monger D.O. on 12/07/2013 at 11:00 AM  Between 7am to 7pm - Pager - [phone]  After 7pm go to [email or web address] - password [email or web address]  And look for the night coverage person covering for me after hours  [facility] Office  [phone]

## 2013-12-07 NOTE — Progress Notes (Signed)
Patient alert and oriented x3 throughout shift (disoriented to time).  Vital signs stable.  Patient updated on plan of care and voices understanding of information.  Patient denies any questions or concerns at this time.  Will continue to monitor.

## 2013-12-07 NOTE — Progress Notes (Signed)
RT Note: Pt refusing CPAP at this time. 

## 2013-12-08 LAB — BASIC METABOLIC PANEL
ANION GAP: 12 (ref 5–15)
BUN: 31 mg/dL — ABNORMAL HIGH (ref 6–23)
CHLORIDE: 108 meq/L (ref 96–112)
CO2: 27 mEq/L (ref 19–32)
Calcium: 8.7 mg/dL (ref 8.4–10.5)
Creatinine, Ser: 1.12 mg/dL (ref 0.50–1.35)
GFR calc Af Amer: 89 mL/min — ABNORMAL LOW (ref >90)
GFR calc non Af Amer: 77 mL/min — ABNORMAL LOW (ref >90)
GLUCOSE: 159 mg/dL — AB (ref 70–99)
Potassium: 4.6 mEq/L (ref 3.7–5.3)
Sodium: 147 mEq/L (ref 137–147)

## 2013-12-08 LAB — GLUCOSE, CAPILLARY: Glucose-Capillary: 148 mg/dL — ABNORMAL HIGH (ref 70–99)

## 2013-12-08 NOTE — Progress Notes (Signed)
Patient ID: [name], male   DOB: 01/15/1966, 48 y.o.   MRN: [id number] BP 124/74  Pulse 92  Temp(Src) 97.8 F (36.6 C) (Oral)  Resp 18  Ht 5\' 2"  (1.575 m)  Wt 87.227 kg (192 lb 4.8 oz)  BMI 35.16 kg/m2  SpO2 98% Lethargic, following commands Will move lower extremities Weakness bilaterally lower extremities-effort is variable Awaiting MRI lumbar in anticipation of decompressive procedure next week.

## 2013-12-08 NOTE — Progress Notes (Signed)
Lenny Pastel, NP paged regarding new onset of numbness in bilateral fingers. Grips equal and moderate, have not changed. Claiborne Billings requested that RN make Neurosurgery aware since Neurosurgery is consulting. Neurosurgery paged at [phone]. Dr. Jordan Likes returned page. No orders given at this time. Will continue to monitor. Salvadore Oxford, RN 12/08/13

## 2013-12-08 NOTE — Progress Notes (Signed)
[facility]                                                                              Patient Demographics  [name], is a 48 y.o. male, DOB - Apr 13, 1966, ZOX:[id number]  Admit date - 12/02/2013   Admitting Physician Dewayne Shorter Levora Dredge, MD  Outpatient Primary MD for the patient is Default, Provider, MD  LOS - 6   Chief Complaint  Patient presents with  . Fall      HPI on 12/02/2013 [name] Deanda is a 48 y.o. male with a history of developmental disability, obstructive sleep apnea, COPD, and tobacco abuse. He also has a history of acute respiratory failure with hypoxia secondary to obstructive sleep apnea and opiate-induced altered mentation during the hospitalization in May of 2014. Prior to his recent ER visits, he had been ambulating with a walker with no difficulty per history. The patient initially presented to the emergency department on 11/28/2013 with a fall. Apparently, at that time, he rolled out of bed and fell on the floor. He complained of cramping in his left thigh. X-rays at that time revealed no acute fractures. He was given Flexeril and then discharged back to [facility] assisted living. Apparently, he was instructed to ambulate via a wheelchair. He presented again on 7/25 and 7/26 for recurrent falls from his wheelchair. More x-rays were ordered and revealed no acute fractures. CT of his head was negative. He returned on 12/02/2013 for the same issues. He reported falling asleep in the wheelchair and falling on the floor. He does not recall passing out. He has no history of seizures. He denied any prior headache, chest pain, palpitations, shortness of breath, fever, chills, nausea, vomiting, or diarrhea. He complained of severe cramping pain in both of his legs mostly over his thighs, and so painful that he cannot walk. He also cannot straighten out his legs without pain being excruciating. It radiated to his groin. He denied neck pain or lower back pain.  In the ED,  he was mildly tachycardic, otherwise hemodynamically stable. He was afebrile. His lab data are significant for total CK of 2997, WBC of 11.1, hemoglobin 11.7, and sodium of 150. X-ray of his lumbar spine reveals probable fractures in the lower thoracic and upper lumbar region. He was admitted for further evaluation and management.  Interim history Patient was sent to [facility] for further evaluation by neurosurgery. Patient was to have an MRI of the lumbar spine. Patient continues to have weakness in the LE B/L.    Assessment & Plan   Multiple falls with gait disorder, possibly related to cervical myelopathy -Patient had multiple falls at assisted living, had also fallen out of his wheelchair, and continues to complain of lower extremity pain and spasms. Patient attributes his falls his obstructive sleep apnea. -MRI was attempted upon admission however patient was unable to tolerate it. -CT of the spine was ordered and showed severe degenerative changes with spondylosis throughout the spine. There was no indication of cord impingement or compromise on initial noncontrast CT. -CT of the head was negative on admission -Neurology at [facility], Dr. Gerilyn Pilgrim was consulted and believed the patient had a cervical  myelopathy -MRI of the cervical spine was ordered, patient was unable to lie flat, CT myelogram was ordered by neurology, findings are indicative of severe canal stenosis with encroachment on the spinal cord at C3-C4.  -Patient was transferred from [facility] -Neurosurgery, Dr.Nundkumar was consulted and recommended MRI of the lumbar spine as well as anti-spasmodic medications for his leg cramps and spasms -EEG was refused by patient -Patient was seen by physical therapy at [facility] was recommended -MRI lumbar spine OB/GYN on Monday, 12/09/2013 under anesthesia.  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  Mild rhabdomyolysis -Likely secondary to falls -CPK levels have normalized  Dehydration with hypernatremia -Patient has a history of this. Denied any nausea or vomiting or diarrhea -Was placed on half-normal saline on admission and transitioned to dextrose infusion -Hypernatremia, resolved  Obstructive sleep apnea -Continue CPAP however patient's has been declining  COPD with tobacco abuse -Continue inhalers -Patient declines nicotine patch -Smoking cessation counseling given  Mild Leukocytosis -Resolved, Likely secondary to stress reaction versus steroids -No evidence of active infection  Macrocytic Anemia -Hemoglobin remained stable at 11.8 -Anemia panel was normal last year -Thiamine B12 and TSH are within normal limits  Developmental disability -Stable  Sinus tachycardia -Likely secondary to pain, TSH normal  Code Status: Full  Family Communication: None at bedside  Disposition Plan: Admitted, pending further workup  Time Spent in minutes   20 minutes  Procedures  None  Consults   Neurology Neurosurgery  DVT Prophylaxis  Heparin  Lab Results  Component Value Date   PLT 248 12/07/2013    Medications  Scheduled Meds: . budesonide  0.25 mg Nebulization BID  . diazepam  10 mg Oral QID  . heparin subcutaneous  5,000 Units Subcutaneous 3 times per day  . methylPREDNISolone (SOLU-MEDROL) injection  1,000 mg Intravenous Q24H  . senna  1 tablet Oral QHS   Continuous Infusions: . dextrose 75 mL/hr at 12/07/13 1922   PRN Meds:.acetaminophen, acetaminophen, albuterol, alum & mag hydroxide-simeth, guaiFENesin-dextromethorphan, HYDROcodone-acetaminophen, morphine  injection, ondansetron (ZOFRAN) IV, ondansetron  Antibiotics    Anti-infectives   None      Subjective:   Casimiro NeedleMichael Roebuck seen and examined today.  Patient continues to complain of pain, however, this morning he states he is sore. He is unable to straighten out his legs. Patient denies any shortness of breath, headaches, dizziness, chest pain, abdominal pain.  Objective:   Filed Vitals:   12/07/13 2144 12/08/13 0121 12/08/13 0519 12/08/13 0845  BP: 124/72 148/83 137/85 124/74  Pulse: 103 93 92 92  Temp: 97.4 F (36.3 C) 98 F (36.7 C) 97.7 F (36.5 C) 97.8 F (36.6 C)  TempSrc: Oral Oral Oral Oral  Resp: 18 18 18 18   Height:      Weight:      SpO2: 93% 94% 99% 98%    Wt Readings from Last 3 Encounters:  12/07/13 87.227 kg (192 lb 4.8 oz)  12/01/13 90.719 kg (200 lb)  11/30/13 90.719 kg (200 lb)     Intake/Output Summary (Last 24 hours) at 12/08/13 0952 Last data filed at 12/07/13 1604  Gross per 24 hour  Intake    750  ml  Output   1600 ml  Net   -850 ml    Exam  General: Well developed, well nourished, NAD, appears stated age  HEENT: [place], abrasions on the frontal scalp, nasal bridge, forehead,  mucous membranes moist.   Cardiovascular: S1 S2 auscultated, no rubs, murmurs or gallops. Regular rate and rhythm.  Respiratory: Clear to auscultation bilaterally with equal chest rise  Abdomen: Soft, nontender, nondistended, + bowel sounds  Extremities: warm dry without cyanosis clubbing. LE contracted, unable to straighten.   Neuro: AAOx3, no new focal deficits. Patient will not extend his legs.  Data Review   Micro Results No results found for this or any previous visit (from the past 240 hour(s)).  Radiology Reports Dg Lumbar Spine 2-3 Views  12/02/2013   CLINICAL DATA:  Pain post trauma  EXAM: LUMBAR SPINE - 2-3 VIEW  COMPARISON:  Report of prior lumbar series June 18, 2011 available ; images from that study cannot be retrieved at this time.  FINDINGS:  Frontal and lateral views were obtained. There are 5 non-rib-bearing lumbar type vertebral bodies. There is lumbar levoscoliosis. There is remodeling of the T11, T12, L1, and L2 vertebral bodies with what appears to be chronic wedging at these levels. No acute appearing fracture is seen. There is 1.7 cm of anterolisthesis of L5 on S1. No other spondylolisthesis is seen.  There is moderately severe disc space narrowing at all levels. No erosive change.  IMPRESSION: Probable fractures in the lower thoracic and upper lumbar region. 1.7 cm of anterolisthesis of L5 on S1. By report, there was 1.1 cm of anterolisthesis of L5 on S1 on the previous study. Suspect progression of arthropathy as the cause of the increase in spondylolisthesis, although traumatic etiology cannot be excluded. There is scoliosis as well as multilevel arthropathy.   Electronically Signed   By: Bretta Bang M.D.   On: 12/02/2013 09:47   Dg Pelvis 1-2 Views  11/30/2013   CLINICAL DATA:  Pelvic pain.  EXAM: PELVIS - 1-2 VIEW  COMPARISON:  None.  FINDINGS: Please note that the patient was very combative and refused to comply with the examination. A limited view of the pelvis was obtained.  No acute bony abnormalities are noted.  Degenerative changes in the lower lumbar spine a present.  Please note that the inferior portion the pelvis left hip are off the field of view.  IMPRESSION: Limited evaluation secondary to patient's inability to comply with examination. No acute bony abnormalities noted.   Electronically Signed   By: Laveda Abbe M.D.   On: 11/30/2013 14:49   Dg Hip Complete Left  12/02/2013   CLINICAL DATA:  Left hip pain status post fall  EXAM: LEFT HIP - COMPLETE 2+ VIEW  COMPARISON:  None.  FINDINGS: There is no obvious hip fracture or dislocation. There is no lytic or blastic osseous lesion. Examination is limited secondary to difficulty in patient positioning secondary to pain.  Degenerative changes of the lower lumbar spine.   IMPRESSION: 1. Limited exam secondary to suboptimal positioning. No evidence of acute fracture or dislocation. If there is persistent clinical concern, and patient is unable to tolerate satisfactory positioning further evaluation with MRI may be helpful.   Electronically Signed   By: Elige Ko   On: 12/02/2013 09:45   Dg Hip Complete Left  11/28/2013   CLINICAL DATA:  Status post fall; left hip pain.  EXAM: LEFT HIP - COMPLETE 2+ VIEW  COMPARISON:  None.  FINDINGS: There is  no evidence of fracture or dislocation. Both femoral heads are seated normally within their respective acetabula. The proximal left femur appears intact. No significant degenerative change is appreciated. The sacroiliac joints are unremarkable in appearance. An apparent loose body is noted at the superior rim of the left acetabulum. Alternatively, this could reflect a small avulsion fracture.  The visualized bowel gas pattern is grossly unremarkable in appearance.  IMPRESSION: 1. No definite evidence of fracture or dislocation. 2. Apparent loose body noted at the superior rim of the left acetabulum. Alternatively, this could reflect a small avulsion fracture.   Electronically Signed   By: Roanna Raider M.D.   On: 11/28/2013 04:08   Dg Femur Left  11/30/2013   CLINICAL DATA:  Fall, combative, confusion, groin pain  EXAM: LEFT FEMUR - 2 VIEW  COMPARISON:  11/29/2013  FINDINGS: Limited exam because of positioning and body habitus. Proximal aspect of the left femur and hip region are obscured by soft tissues and overlapping shadows. No gross displaced fracture evident. Degenerative changes noted of the left knee joint. No definite soft tissue abnormality.  IMPRESSION: Very limited exam because of positioning.  Left knee tricompartmental osteoarthritis.   Electronically Signed   By: Ruel Favors M.D.   On: 11/30/2013 14:48   Dg Femur Left  11/29/2013   CLINICAL DATA:  Pain extending from the left hip to the knee after falling yesterday.   EXAM: LEFT FEMUR - 2 VIEW  COMPARISON:  Radiographs 12/01/2003.  FINDINGS: Stable coxa magna deformity. No evidence of acute fracture, dislocation or femoral head avascular necrosis. Os acetabuli appears unchanged. Tricompartmental degenerative changes are present at the knee, most advanced within the patellofemoral compartment.  IMPRESSION: No acute osseous findings demonstrated.   Electronically Signed   By: Roxy Horseman M.D.   On: 11/29/2013 02:01   Ct Head Wo Contrast  12/01/2013   CLINICAL DATA:  Headache and weakness. Status post fall out of bed. Skin tear at the mid forehead.  EXAM: CT HEAD WITHOUT CONTRAST  TECHNIQUE: Contiguous axial images were obtained from the base of the skull through the vertex without intravenous contrast.  COMPARISON:  CT of the head performed 11/30/2013  FINDINGS: There is no evidence of acute infarction, mass lesion, or intra- or extra-axial hemorrhage on CT.  The posterior fossa, including the cerebellum, brainstem and fourth ventricle, is within normal limits. The third and lateral ventricles, and basal ganglia are unremarkable in appearance. The cerebral hemispheres are symmetric in appearance, with normal gray-white differentiation. No mass effect or midline shift is seen.  There is no evidence of fracture; visualized osseous structures are unremarkable in appearance. Bilateral proptosis is noted. A small mucus retention cyst or polyp is noted at the left maxillary sinus. The remaining paranasal sinuses and mastoid air cells are well-aerated. Mild soft tissue swelling is noted overlying the left frontal calvarium.  IMPRESSION: 1. No evidence of traumatic intracranial injury or fracture. 2. Mild soft tissue swelling overlying the left frontal calvarium. 3. Bilateral proptosis noted. 4. Small mucus retention cyst or polyp at the left maxillary sinus.   Electronically Signed   By: Roanna Raider M.D.   On: 12/01/2013 04:58   Ct Head Wo Contrast  11/30/2013   CLINICAL DATA:   Pain post trauma ; altered mental status  EXAM: CT HEAD WITHOUT CONTRAST  TECHNIQUE: Contiguous axial images were obtained from the base of the skull through the vertex without intravenous contrast.  COMPARISON:  August 08, 2013  FINDINGS: The ventricles  are normal in size and configuration. There is a rather minimal cavum septum pellucidum, an anatomic variant. There is no mass, hemorrhage, extra-axial fluid collection, or midline shift. Wallace Cullens and white compartments appear within normal limits. No apparent acute infarct. There is a small left frontal scalp hematoma. Bony calvarium appears intact. The mastoid air cells are clear. There is debris in the external auditory canal on the left.  IMPRESSION: No intracranial mass, hemorrhage, or extra-axial fluid. Gray-white compartments appear normal. Probable cerumen in the left external auditory canal.   Electronically Signed   By: Bretta Bang M.D.   On: 11/30/2013 14:34   Ct Cervical Spine Wo Contrast  12/02/2013   CLINICAL DATA:  Fall.  Evaluate for injury.  EXAM: CT CERVICAL, THORACIC, AND LUMBAR SPINE WITHOUT CONTRAST  TECHNIQUE: Multidetector CT imaging of the cervical, thoracic and lumbar spine was performed without intravenous contrast. Multiplanar CT image reconstructions were also generated.  COMPARISON:  CT cervical spine 08/08/2013 and CT chest 09/19/2012. CT pelvis 11/29/2013.  FINDINGS: CT CERVICAL SPINE FINDINGS  There is straightening of the normal cervical lordosis. Slight flattening of the C5 superior endplate is new from 08/08/2013. Vertebral body height is otherwise maintained and unchanged. Advanced degenerative disc disease at C3-4 with marked loss of disc space height. Neural foramina appear widely patent. Visualized portions of the intracranial contents show no acute findings. Soft tissues are unremarkable.  CT THORACIC SPINE FINDINGS  Alignment is anatomic, including at the cervicothoracic junction. Vertebral body height is maintained.  Scattered endplate degenerative changes. Visualized portions of the ribs show no fracture. Visualized portions of the lungs shows scattered subsegmental atelectasis or scarring. No pleural fluid.  CT LUMBAR SPINE FINDINGS  Straightening of the normal lumbar lordosis. Approximately 12 mm anterolisthesis of L5 on S1 with severe degenerative disc disease and obliteration of the joint space, as on 11/29/2013. There are chronic bilateral L5 pars defects. Vertebral body height and alignment are otherwise maintained. Endplate degenerative changes and prominent Schmorl's nodes are seen of the thoracolumbar junction as well as at L1-2 and L2-3.  Visualized portions of the kidneys and retroperitoneum show no acute findings. Bladder is distended.  IMPRESSION: 1. C5 superior endplate appears slightly compressed when compared with 08/08/2013. Otherwise, no evidence of an acute fracture or subluxation. 2. Multilevel spondylosis. 3. Grade 2 anterolisthesis of L5 on S1, secondary to chronic bilateral pars defects, with advanced secondary degenerative disc disease.   Electronically Signed   By: Leanna Battles M.D.   On: 12/02/2013 17:46   Ct Cervical Spine W Contrast  12/04/2013   CLINICAL DATA:  Cervical myelopathy. Lower extremity weakness. Spasticity.  EXAM: CT MYELOGRAPHY CERVICAL AND THORACIC SPINE  TECHNIQUE: CT imaging of the cervical and thoracic spine was performed after intrathecal contrast administration. Multiplanar CT image reconstructions were also generated.  COMPARISON:  CT cervical and lumbar spine 12/02/2013  FINDINGS: CT myelogram cervical spine:  Lumbar puncture and injection of contrast by Dr. Tyron Russell. The patient was not able to get into the magnet for MRI.  Mild retrolisthesis at C3-4 secondary to disc and facet degeneration. Negative for fracture or mass. No bony destruction.  C2-3:  Small central disc protrusion without cord deformity  C3-4: Marked disc degeneration with disc space narrowing endplate  sclerosis and endplate cystic changes. Cervical spondylosis with diffuse uncinate spurring. Bilateral facet hypertrophy. There is moderate spinal stenosis with flattening of the cord. There is foraminal encroachment bilaterally secondary to spurring.  C4-5: Diffuse disc degeneration with a left paracentral disc  protrusion causing mild flattening of the cord. Mild spinal stenosis. Neural foramina widely patent.  C5-6:  Small central disc protrusion with mild spinal stenosis noted  C6-7: Small central disc protrusion without significant spinal stenosis  C7-T1:  Negative  CT myelogram thoracic spine:  Negative for fracture or mass. No cord compression. Spinal cord morphology is normal and no mass lesion is identified in the cord.  Diffuse thoracic disc degeneration with disc space narrowing in the mid and lower thoracic spine. This is most severe at T12-L1 and L1-2 were there is marked disc space narrowing, endplate sclerosis and cystic change. There is mild osteophyte at T12-L1 and L1-2 without significant spinal stenosis or cord deformity. No disc protrusion identified.  IMPRESSION: Cervical spondylosis most severe at C3-4 where there is moderate spinal stenosis and foraminal encroachment bilaterally. There is a left paracentral disc protrusion at C4-5 with mild spinal stenosis. Mild spinal stenosis at C5-6 also noted.  Thoracic degenerative changes most prominent at T12-L1 and L1-2. No cord compression or significant thoracic spinal stenosis.   Electronically Signed   By: Marlan Palau M.D.   On: 12/04/2013 14:19   Ct Thoracic Spine Wo Contrast  12/02/2013   CLINICAL DATA:  Fall.  Evaluate for injury.  EXAM: CT CERVICAL, THORACIC, AND LUMBAR SPINE WITHOUT CONTRAST  TECHNIQUE: Multidetector CT imaging of the cervical, thoracic and lumbar spine was performed without intravenous contrast. Multiplanar CT image reconstructions were also generated.  COMPARISON:  CT cervical spine 08/08/2013 and CT chest 09/19/2012. CT  pelvis 11/29/2013.  FINDINGS: CT CERVICAL SPINE FINDINGS  There is straightening of the normal cervical lordosis. Slight flattening of the C5 superior endplate is new from 08/08/2013. Vertebral body height is otherwise maintained and unchanged. Advanced degenerative disc disease at C3-4 with marked loss of disc space height. Neural foramina appear widely patent. Visualized portions of the intracranial contents show no acute findings. Soft tissues are unremarkable.  CT THORACIC SPINE FINDINGS  Alignment is anatomic, including at the cervicothoracic junction. Vertebral body height is maintained. Scattered endplate degenerative changes. Visualized portions of the ribs show no fracture. Visualized portions of the lungs shows scattered subsegmental atelectasis or scarring. No pleural fluid.  CT LUMBAR SPINE FINDINGS  Straightening of the normal lumbar lordosis. Approximately 12 mm anterolisthesis of L5 on S1 with severe degenerative disc disease and obliteration of the joint space, as on 11/29/2013. There are chronic bilateral L5 pars defects. Vertebral body height and alignment are otherwise maintained. Endplate degenerative changes and prominent Schmorl's nodes are seen of the thoracolumbar junction as well as at L1-2 and L2-3.  Visualized portions of the kidneys and retroperitoneum show no acute findings. Bladder is distended.  IMPRESSION: 1. C5 superior endplate appears slightly compressed when compared with 08/08/2013. Otherwise, no evidence of an acute fracture or subluxation. 2. Multilevel spondylosis. 3. Grade 2 anterolisthesis of L5 on S1, secondary to chronic bilateral pars defects, with advanced secondary degenerative disc disease.   Electronically Signed   By: Leanna Battles M.D.   On: 12/02/2013 17:46   Ct Thoracic Spine W Contrast  12/04/2013   CLINICAL DATA:  Cervical myelopathy. Lower extremity weakness. Spasticity.  EXAM: CT MYELOGRAPHY CERVICAL AND THORACIC SPINE  TECHNIQUE: CT imaging of the  cervical and thoracic spine was performed after intrathecal contrast administration. Multiplanar CT image reconstructions were also generated.  COMPARISON:  CT cervical and lumbar spine 12/02/2013  FINDINGS: CT myelogram cervical spine:  Lumbar puncture and injection of contrast by Dr. Tyron Russell. The patient was not  able to get into the magnet for MRI.  Mild retrolisthesis at C3-4 secondary to disc and facet degeneration. Negative for fracture or mass. No bony destruction.  C2-3:  Small central disc protrusion without cord deformity  C3-4: Marked disc degeneration with disc space narrowing endplate sclerosis and endplate cystic changes. Cervical spondylosis with diffuse uncinate spurring. Bilateral facet hypertrophy. There is moderate spinal stenosis with flattening of the cord. There is foraminal encroachment bilaterally secondary to spurring.  C4-5: Diffuse disc degeneration with a left paracentral disc protrusion causing mild flattening of the cord. Mild spinal stenosis. Neural foramina widely patent.  C5-6:  Small central disc protrusion with mild spinal stenosis noted  C6-7: Small central disc protrusion without significant spinal stenosis  C7-T1:  Negative  CT myelogram thoracic spine:  Negative for fracture or mass. No cord compression. Spinal cord morphology is normal and no mass lesion is identified in the cord.  Diffuse thoracic disc degeneration with disc space narrowing in the mid and lower thoracic spine. This is most severe at T12-L1 and L1-2 were there is marked disc space narrowing, endplate sclerosis and cystic change. There is mild osteophyte at T12-L1 and L1-2 without significant spinal stenosis or cord deformity. No disc protrusion identified.  IMPRESSION: Cervical spondylosis most severe at C3-4 where there is moderate spinal stenosis and foraminal encroachment bilaterally. There is a left paracentral disc protrusion at C4-5 with mild spinal stenosis. Mild spinal stenosis at C5-6 also noted.   Thoracic degenerative changes most prominent at T12-L1 and L1-2. No cord compression or significant thoracic spinal stenosis.   Electronically Signed   By: Marlan Palau M.D.   On: 12/04/2013 14:19   Ct Lumbar Spine Wo Contrast  12/02/2013   CLINICAL DATA:  Fall.  Evaluate for injury.  EXAM: CT CERVICAL, THORACIC, AND LUMBAR SPINE WITHOUT CONTRAST  TECHNIQUE: Multidetector CT imaging of the cervical, thoracic and lumbar spine was performed without intravenous contrast. Multiplanar CT image reconstructions were also generated.  COMPARISON:  CT cervical spine 08/08/2013 and CT chest 09/19/2012. CT pelvis 11/29/2013.  FINDINGS: CT CERVICAL SPINE FINDINGS  There is straightening of the normal cervical lordosis. Slight flattening of the C5 superior endplate is new from 08/08/2013. Vertebral body height is otherwise maintained and unchanged. Advanced degenerative disc disease at C3-4 with marked loss of disc space height. Neural foramina appear widely patent. Visualized portions of the intracranial contents show no acute findings. Soft tissues are unremarkable.  CT THORACIC SPINE FINDINGS  Alignment is anatomic, including at the cervicothoracic junction. Vertebral body height is maintained. Scattered endplate degenerative changes. Visualized portions of the ribs show no fracture. Visualized portions of the lungs shows scattered subsegmental atelectasis or scarring. No pleural fluid.  CT LUMBAR SPINE FINDINGS  Straightening of the normal lumbar lordosis. Approximately 12 mm anterolisthesis of L5 on S1 with severe degenerative disc disease and obliteration of the joint space, as on 11/29/2013. There are chronic bilateral L5 pars defects. Vertebral body height and alignment are otherwise maintained. Endplate degenerative changes and prominent Schmorl's nodes are seen of the thoracolumbar junction as well as at L1-2 and L2-3.  Visualized portions of the kidneys and retroperitoneum show no acute findings. Bladder is  distended.  IMPRESSION: 1. C5 superior endplate appears slightly compressed when compared with 08/08/2013. Otherwise, no evidence of an acute fracture or subluxation. 2. Multilevel spondylosis. 3. Grade 2 anterolisthesis of L5 on S1, secondary to chronic bilateral pars defects, with advanced secondary degenerative disc disease.   Electronically Signed   By:  Leanna Battles M.D.   On: 12/02/2013 17:46   Ct Pelvis Wo Contrast  11/29/2013   CLINICAL DATA:  Left hip pain after falling from bed. Mentally challenged.  EXAM: CT PELVIS WITHOUT CONTRAST  TECHNIQUE: Multidetector CT imaging of the pelvis was performed following the standard protocol without intravenous contrast.  COMPARISON:  Radiographs 12/01/2003 and 11/29/2013.  FINDINGS: There are apparent bilateral hip contractures. The utilized thin section technique is suboptimal for the patient's body habitus and these contractures, resulting in decreased signal to noise.  There is no evidence of acute fracture or dislocation. There is no evidence of femoral head avascular necrosis. 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  There is a grade 2 anterolisthesis at L5-S1 secondary to bilateral L5 pars defects. There is severe degenerative disc disease at L5-S1 with endplate sclerosis and vacuum phenomenon. There is severe biforaminal stenosis and probable bilateral L5 nerve root encroachment.  No periarticular hematoma is identified. Central prostatic calcifications and moderate bladder distention are noted. The visualized internal pelvic contents are otherwise unremarkable.  IMPRESSION: 1. No demonstrated acute osseous findings in the pelvis. 2. 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 3. Bilateral L5 pars defects with resulting grade 2 anterolisthesis and severe biforaminal stenosis at L5-S1. There is probable bilateral L5 nerve root encroachment. 4. Central prostatic  calcifications.   Electronically Signed   By: Roxy Horseman M.D.   On: 11/29/2013 02:14   Dg Myelography Lumbar Inj Multi Region  12/05/2013   CLINICAL DATA:  BILATERAL lower extremity weakness, hyperreflexia with up going toes, question cord compression, cervical spinal stenosis on noncontrast CT, inability to have MRI  FLUOROSCOPY TIME:  dictate in minutes and seconds  PROCEDURE: LUMBAR PUNCTURE FOR CERVICAL AND THORACIC MYELOGRAM  After thorough discussion of risks and benefits of the procedure including bleeding, infection, injury to nerves, blood vessels, adjacent structures as well as headache and CSF leak, written and oral informed consent was obtained. Timeout protocol followed.  Due to patient inability to extend hips and lie prone, patient was placed in a RIGHT lower decubitus position. Patient prepped and draped in usual sterile fashion. Local anesthesia was provided with 3 mL of 1% lidocaine without epinephrine. Lumbar puncture was performed at L3-L4 using a 3 1/2 inch 22-gauge spinal needle via a midline approach. Using a single pass through the dura, the needle was placed within the thecal sac, with return of clear colorless CSF. Opening pressure 18 cm H2O. 8 mL of CSF was obtained for requested laboratory analysis. 10 mL of Omnipaque-300 was injected into the thecal sac, with normal opacification of the nerve roots and cauda equina consistent with free flow within the subarachnoid space. The patient was then moved to the Trendelenburg position and contrast flowed into the thoracic and cervical spine.  I personally performed the lumbar puncture and administered the intrathecal contrast.  TECHNIQUE: Contiguous axial images were obtained through the Cervical and Thoracic spine after the intrathecal infusion of infusion. Coronal and sagittal reconstructions were obtained of the axial image sets. Cervical and thoracic CT myelography images are reported separately.  FINDINGS: Free flow of contrast was  identified from the the injection site at the lumbar spine to the cervical spine.  Indentation of the ventral aspect of the thecal sac is identified at the L1-L2 level by bulging disc and endplate spur formation, please refer to CT.  Grade 2 anterolisthesis L5-S1.  No immediate  complications.  IMPRESSION: Lumbar puncture and contrast injection for cervical and thoracic CT myelography as above.   Electronically Signed   By: Ulyses Southward M.D.   On: 12/05/2013 08:19    CBC  Recent Labs Lab 12/02/13 1234 12/03/13 0543 12/07/13 0455  WBC 11.1* 9.4 7.4  HGB 11.7* 11.8* 11.1*  HCT 37.0* 37.0* 34.8*  PLT 237 217 248  MCV 100.0 101.6* 100.3*  MCH 31.6 32.4 32.0  MCHC 31.6 31.9 31.9  RDW 14.9 14.9 14.2  LYMPHSABS 2.1  --   --   MONOABS 1.0  --   --   EOSABS 0.1  --   --   BASOSABS 0.0  --   --     Chemistries   Recent Labs Lab 12/03/13 0543 12/04/13 0717 12/05/13 0556 12/07/13 0455 12/08/13 0347  NA 150* 147 148* 145 147  K 4.7 5.1 5.5* 5.0 4.6  CL 116* 113* 114* 108 108  CO2 24 25 24 26 27   GLUCOSE 72 90 157* 167* 159*  BUN 19 16 21  32* 31*  CREATININE 1.21 1.13 1.19 1.12 1.12  CALCIUM 8.8 8.8 9.0 8.8 8.7  AST 66*  --   --   --   --   ALT 41  --   --   --   --   ALKPHOS 106  --   --   --   --   BILITOT 0.3  --   --   --   --    ------------------------------------------------------------------------------------------------------------------ estimated creatinine clearance is 78 ml/min (by C-G formula based on Cr of 1.12). ------------------------------------------------------------------------------------------------------------------ No results found for this basename: HGBA1C,  in the last 72 hours ------------------------------------------------------------------------------------------------------------------ No results found for this basename: CHOL, HDL, LDLCALC, TRIG, CHOLHDL, LDLDIRECT,  in the last 72  hours ------------------------------------------------------------------------------------------------------------------ No results found for this basename: TSH, T4TOTAL, FREET3, T3FREE, THYROIDAB,  in the last 72 hours ------------------------------------------------------------------------------------------------------------------ No results found for this basename: VITAMINB12, FOLATE, FERRITIN, TIBC, IRON, RETICCTPCT,  in the last 72 hours  Coagulation profile No results found for this basename: INR, PROTIME,  in the last 168 hours  No results found for this basename: DDIMER,  in the last 72 hours  Cardiac Enzymes  Recent Labs Lab 12/03/13 0543  CKMB <1.0   ------------------------------------------------------------------------------------------------------------------ No components found with this basename: POCBNP,     Jalene Demo D.O. on 12/08/2013 at 9:52 AM  Between 7am to 7pm - Pager - [phone]  After 7pm go to [email or web address] - password [email or web address]  And look for the night coverage person covering for me after hours  [facility] Office  [phone]

## 2013-12-09 ENCOUNTER — Encounter (HOSPITAL_COMMUNITY): Payer: Self-pay | Admitting: Anesthesiology

## 2013-12-09 ENCOUNTER — Encounter (HOSPITAL_COMMUNITY)
Admission: EM | Disposition: A | Discharge status: Skilled Nursing Facility | Payer: Self-pay | Source: Home / Self Care | Attending: Internal Medicine

## 2013-12-09 ENCOUNTER — Inpatient Hospital Stay (HOSPITAL_COMMUNITY): Payer: Medicaid Other

## 2013-12-09 ENCOUNTER — Encounter (HOSPITAL_COMMUNITY): Payer: Medicaid Other | Admitting: Anesthesiology

## 2013-12-09 ENCOUNTER — Inpatient Hospital Stay (HOSPITAL_COMMUNITY): Payer: Medicaid Other | Admitting: Anesthesiology

## 2013-12-09 HISTORY — PX: RADIOLOGY WITH ANESTHESIA: SHX6223

## 2013-12-09 LAB — BASIC METABOLIC PANEL
Anion gap: 10 (ref 5–15)
BUN: 31 mg/dL — AB (ref 6–23)
CHLORIDE: 109 meq/L (ref 96–112)
CO2: 27 meq/L (ref 19–32)
Calcium: 8.8 mg/dL (ref 8.4–10.5)
Creatinine, Ser: 1.15 mg/dL (ref 0.50–1.35)
GFR calc non Af Amer: 74 mL/min — ABNORMAL LOW (ref >90)
GFR, EST AFRICAN AMERICAN: 86 mL/min — AB (ref >90)
Glucose, Bld: 162 mg/dL — ABNORMAL HIGH (ref 70–99)
POTASSIUM: 4.5 meq/L (ref 3.7–5.3)
Sodium: 146 mEq/L (ref 137–147)

## 2013-12-09 SURGERY — RADIOLOGY WITH ANESTHESIA
Anesthesia: General

## 2013-12-09 MED ORDER — MEPERIDINE HCL 25 MG/ML IJ SOLN: 6.2500 mg | INTRAMUSCULAR | Status: DC | PRN

## 2013-12-09 MED ORDER — LACTATED RINGERS IV SOLN
INTRAVENOUS | Status: DC
  Administered 2013-12-10: 01:00:00 via INTRAVENOUS

## 2013-12-09 MED ORDER — GADOBENATE DIMEGLUMINE 529 MG/ML IV SOLN
20.0000 mL | Freq: Once | INTRAVENOUS | Status: AC | PRN
  Administered 2013-12-09: 20 mL via INTRAVENOUS

## 2013-12-09 MED ORDER — BACLOFEN 10 MG PO TABS
5.0000 mg | ORAL_TABLET | Freq: Three times a day (TID) | ORAL | Status: DC
  Administered 2013-12-09: 5 mg via ORAL
  Filled 2013-12-09: qty 1

## 2013-12-09 MED ORDER — ONDANSETRON HCL 4 MG/2ML IJ SOLN: 4.0000 mg | Freq: Once | INTRAMUSCULAR | Status: DC | PRN

## 2013-12-09 MED ORDER — OXYCODONE HCL 5 MG/5ML PO SOLN: 5.0000 mg | Freq: Once | ORAL | Status: DC | PRN

## 2013-12-09 MED ORDER — HYDROMORPHONE HCL PF 1 MG/ML IJ SOLN: 0.2500 mg | INTRAMUSCULAR | Status: DC | PRN

## 2013-12-09 MED ORDER — OXYCODONE HCL 5 MG PO TABS: 5.0000 mg | ORAL_TABLET | Freq: Once | ORAL | Status: DC | PRN

## 2013-12-09 NOTE — Progress Notes (Signed)
Spoke with Dr. Conchita ParisNundkumar.  Patient's symptoms not likely related to MRI findings.  Will try patient on baclofen tablets.  If patient has relief of his spasms, may consider injections and/or pump at some point.

## 2013-12-09 NOTE — Anesthesia Postprocedure Evaluation (Signed)
Anesthesia Post Note  Patient: [name]  Procedure(s) Performed: Procedure(s) (LRB): MRI OF LUMBAR W/WO CONTRAST  (N/A)  Anesthesia type: general  Patient location: PACU  Post pain: Pain level controlled  Post assessment: Patient's Cardiovascular Status Stable  Last Vitals:  Filed Vitals:   12/09/13 1330  BP: 136/81  Pulse: 107  Temp:   Resp: 16    Post vital signs: Reviewed and stable  Level of consciousness: sedated  Complications: No apparent anesthesia complications

## 2013-12-09 NOTE — Clinical Social Work Note (Signed)
CSW attempted to speak with pt's sister, Luther HearingMelissa Hill [phone]). CSW has left message with pt's sister to confirm discharge disposition.  CSW contacted pt's residence [facility], [phone]). Per [facility] admissions liaison, [facility] at [facility] discharge and then transition [facility] once therapy completed.  CSW to continue to follow and assist with discharge once medically stable.  Marcelline DeistEmily Phoenyx Melka, MSW, [facility] Licensed Clinical Social Worker [phone] and [phone] [phone]

## 2013-12-09 NOTE — Anesthesia Preprocedure Evaluation (Addendum)
Anesthesia Evaluation  Patient identified by MRN, date of birth, ID band Patient confused    Reviewed: Allergy & Precautions, H&P , NPO status , Patient's Chart, lab work & pertinent test results  Airway Mallampati: II TM Distance: >3 FB Neck ROM: Full    Dental  (+) Teeth Intact, Dental Advisory Given   Pulmonary sleep apnea , COPDCurrent Smoker,          Cardiovascular hypertension, Pt. on medications     Neuro/Psych PSYCHIATRIC DISORDERS  Neuromuscular disease    GI/Hepatic   Endo/Other    Renal/GU      Musculoskeletal   Abdominal   Peds  Hematology   Anesthesia Other Findings Hx Acute respiratory failure Cognitive developmental delay Generalized muscle weakness Cervical and lumbosacral spondylosis   Reproductive/Obstetrics                          Anesthesia Physical Anesthesia Plan  ASA: III  Anesthesia Plan: General   Post-op Pain Management:    Induction: Intravenous  Airway Management Planned: Oral ETT  Additional Equipment:   Intra-op Plan:   Post-operative Plan: Extubation in OR  Informed Consent: I have reviewed the patients History and Physical, chart, labs and discussed the procedure including the risks, benefits and alternatives for the proposed anesthesia with the patient or authorized representative who has indicated his/her understanding and acceptance.   Dental advisory given  Plan Discussed with: CRNA, Anesthesiologist and Surgeon  Anesthesia Plan Comments:         Anesthesia Quick Evaluation

## 2013-12-09 NOTE — Progress Notes (Signed)
[facility]                                                                              Patient Demographics  [name], is a 48 y.o. male, DOB - November 30, 1965, ZOX:[id number]  Admit date - 12/02/2013   Admitting Physician Dewayne Shorter Levora Dredge, MD  Outpatient Primary MD for the patient is Default, Provider, MD  LOS - 7   Chief Complaint  Patient presents with  . Fall      HPI on 12/02/2013 [name] is a 48 y.o. male with a history of developmental disability, obstructive sleep apnea, COPD, and tobacco abuse. He also has a history of acute respiratory failure with hypoxia secondary to obstructive sleep apnea and opiate-induced altered mentation during the hospitalization in May of 2014. Prior to his recent ER visits, he had been ambulating with a walker with no difficulty per history. The patient initially presented to the emergency department on 11/28/2013 with a fall. Apparently, at that time, he rolled out of bed and fell on the floor. He complained of cramping in his left thigh. X-rays at that time revealed no acute fractures. He was given Flexeril and then discharged back to [facility] assisted living. Apparently, he was instructed to ambulate via a wheelchair. He presented again on 7/25 and 7/26 for recurrent falls from his wheelchair. More x-rays were ordered and revealed no acute fractures. CT of his head was negative. He returned on 12/02/2013 for the same issues. He reported falling asleep in the wheelchair and falling on the floor. He does not recall passing out. He has no history of seizures. He denied any prior headache, chest pain, palpitations, shortness of breath, fever, chills, nausea, vomiting, or diarrhea. He complained of severe cramping pain in both of his legs mostly over his thighs, and so painful that he cannot walk. He also cannot straighten out his legs without pain being excruciating. It radiated to his groin. He denied neck pain or lower back pain.  In the ED,  he was mildly tachycardic, otherwise hemodynamically stable. He was afebrile. His lab data are significant for total CK of 2997, WBC of 11.1, hemoglobin 11.7, and sodium of 150. X-ray of his lumbar spine reveals probable fractures in the lower thoracic and upper lumbar region. He was admitted for further evaluation and management.  Interim history Patient was sent to [facility] for further evaluation by neurosurgery.  Patient continues to have weakness in the LE B/L, but improving.  Patient is to have MRI of the lumbar spine today, under anesthesia.    Assessment & Plan   Multiple falls with gait disorder, possibly related to cervical myelopathy -Patient had multiple falls at assisted living, had also fallen out of his wheelchair, and continues to complain of lower extremity pain and spasms. Patient attributes his falls his obstructive sleep apnea. -MRI was attempted upon admission however patient was unable to tolerate it. -CT of the spine was ordered and showed severe degenerative changes with spondylosis throughout the spine. There was no indication of cord impingement or compromise on initial noncontrast CT. -CT of the head was negative on admission -Neurology at [facility], Dr. Gerilyn Pilgrim was consulted and  believed the patient had a cervical myelopathy -MRI of the cervical spine was ordered, patient was unable to lie flat, CT myelogram was ordered by neurology, findings are indicative of severe canal stenosis with encroachment on the spinal cord at C3-C4.  -[facility] -Neurosurgery, Dr.Nundkumar was consulted and recommended MRI of the lumbar spine as well as anti-spasmodic medications for his leg cramps and spasms -EEG was refused by patient -Patient was seen by physical [facility] at [facility] and SNF was recommended -MRI lumbar spine on Monday, 12/09/2013 under anesthesia.  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  Mild rhabdomyolysis -Likely secondary to falls -CPK levels have normalized  Dehydration with hypernatremia -Patient has a history of this. Denied any nausea or vomiting or diarrhea -Was placed on half-normal saline on admission and transitioned to dextrose infusion -Hypernatremia, resolved  Obstructive sleep apnea -Continue CPAP however patient's has been declining  COPD with tobacco abuse -Continue inhalers -Patient declines nicotine patch -Smoking cessation counseling given  Mild Leukocytosis -Resolved, Likely secondary to stress reaction versus steroids -No evidence of active infection  Macrocytic Anemia -Hemoglobin remained stable at 11.8 -Anemia panel was normal last year -Thiamine B12 and TSH are within normal limits  Developmental disability -Stable  Sinus tachycardia -Likely secondary to pain, TSH normal  Code Status: Full  Family Communication: None at bedside  Disposition Plan: Admitted, pending MRI today and further recommendations from neurosurgery  Time Spent in minutes   20 minutes  Procedures  None  Consults   Neurology Neurosurgery  DVT Prophylaxis  Heparin  Lab Results  Component Value Date   PLT 248 12/07/2013    Medications  Scheduled Meds: . [MAR HOLD] budesonide  0.25 mg Nebulization BID  . [facility][MAR HOLD] diazepam  10 mg Oral QID  . [facility][MAR HOLD] heparin subcutaneous  5,000 Units Subcutaneous 3 times per day  . [MAR HOLD] methylPREDNISolone (SOLU-MEDROL) injection  1,000 mg Intravenous Q24H  . [facility][MAR HOLD] senna  1 tablet Oral QHS   Continuous Infusions: . dextrose 75 mL/hr at 12/07/13 1922  . lactated ringers     PRN Meds:.[MAR  HOLD] acetaminophen, [MAR HOLD] acetaminophen, [MAR HOLD] albuterol, [MAR HOLD] alum & mag hydroxide-simeth, [MAR HOLD] guaiFENesin-dextromethorphan, [MAR HOLD] HYDROcodone-acetaminophen, [MAR HOLD]  morphine injection, [MAR HOLD] ondansetron (ZOFRAN) IV, [MAR HOLD] ondansetron  Antibiotics    Anti-infectives   None      Subjective:   [name] seen and examined today.  Patient continues to complain of pain, however, improved today.  He is still unable to straighten his legs. Overnight, patient complained of hand numbness and tingling, but states this has improved as well.  He denies any shortness of breath, headaches, dizziness, chest pain, abdominal pain.  Objective:   Filed Vitals:   12/08/13 1832 12/08/13 2150 12/09/13 0151 12/09/13 0500  BP: 113/63 138/86 127/78   Pulse: 96 99 92   Temp: 98 F (36.7 C) 97.3 F (36.3 C) 97.6 F (36.4 C)   TempSrc: Oral Oral Oral   Resp: 18 18 16    Height:      Weight:    86.002 kg (189 lb 9.6 oz)  SpO2: 99% 94% 94%  Wt Readings from Last 3 Encounters:  12/09/13 86.002 kg (189 lb 9.6 oz)  12/09/13 86.002 kg (189 lb 9.6 oz)  12/01/13 90.719 kg (200 lb)     Intake/Output Summary (Last 24 hours) at 12/09/13 1053 Last data filed at 12/09/13 0330  Gross per 24 hour  Intake      0 ml  Output    900 ml  Net   -900 ml    Exam  General: Well developed, well nourished, NAD, appears stated age  HEENT: [place], abrasions on the frontal scalp, nasal bridge, forehead,  mucous membranes moist.   Cardiovascular: S1 S2 auscultated, no rubs, murmurs or gallops. Regular rate and rhythm.  Respiratory: Clear to auscultation bilaterally with equal chest rise  Abdomen: Soft, nontender, nondistended, + bowel sounds  Extremities: warm dry without cyanosis clubbing. LE contracted, unable to straighten.   Neuro: AAOx3, no new focal deficits. Cannot extend lower extremities  Data Review   Micro Results No results found for this or any  previous visit (from the past 240 hour(s)).  Radiology Reports Dg Lumbar Spine 2-3 Views  12/02/2013   CLINICAL DATA:  Pain post trauma  EXAM: LUMBAR SPINE - 2-3 VIEW  COMPARISON:  Report of prior lumbar series June 18, 2011 available ; images from that study cannot be retrieved at this time.  FINDINGS: Frontal and lateral views were obtained. There are 5 non-rib-bearing lumbar type vertebral bodies. There is lumbar levoscoliosis. There is remodeling of the T11, T12, L1, and L2 vertebral bodies with what appears to be chronic wedging at these levels. No acute appearing fracture is seen. There is 1.7 cm of anterolisthesis of L5 on S1. No other spondylolisthesis is seen.  There is moderately severe disc space narrowing at all levels. No erosive change.  IMPRESSION: Probable fractures in the lower thoracic and upper lumbar region. 1.7 cm of anterolisthesis of L5 on S1. By report, there was 1.1 cm of anterolisthesis of L5 on S1 on the previous study. Suspect progression of arthropathy as the cause of the increase in spondylolisthesis, although traumatic etiology cannot be excluded. There is scoliosis as well as multilevel arthropathy.   Electronically Signed   By: Bretta Bang M.D.   On: 12/02/2013 09:47   Dg Pelvis 1-2 Views  11/30/2013   CLINICAL DATA:  Pelvic pain.  EXAM: PELVIS - 1-2 VIEW  COMPARISON:  None.  FINDINGS: Please note that the patient was very combative and refused to comply with the examination. A limited view of the pelvis was obtained.  No acute bony abnormalities are noted.  Degenerative changes in the lower lumbar spine a present.  Please note that the inferior portion the pelvis left hip are off the field of view.  IMPRESSION: Limited evaluation secondary to patient's inability to comply with examination. No acute bony abnormalities noted.   Electronically Signed   By: Laveda Abbe M.D.   On: 11/30/2013 14:49   Dg Hip Complete Left  12/02/2013   CLINICAL DATA:  Left hip pain status  post fall  EXAM: LEFT HIP - COMPLETE 2+ VIEW  COMPARISON:  None.  FINDINGS: There is no obvious hip fracture or dislocation. There is no lytic or blastic osseous lesion. Examination is limited secondary to difficulty in patient positioning secondary to pain.  Degenerative changes of the lower lumbar spine.  IMPRESSION: 1. Limited exam secondary to suboptimal positioning. No evidence of acute fracture or dislocation. If there is persistent clinical concern, and patient is unable to tolerate satisfactory  positioning further evaluation with MRI may be helpful.   Electronically Signed   By: Elige Ko   On: 12/02/2013 09:45   Dg Hip Complete Left  11/28/2013   CLINICAL DATA:  Status post fall; left hip pain.  EXAM: LEFT HIP - COMPLETE 2+ VIEW  COMPARISON:  None.  FINDINGS: There is no evidence of fracture or dislocation. Both femoral heads are seated normally within their respective acetabula. The proximal left femur appears intact. No significant degenerative change is appreciated. The sacroiliac joints are unremarkable in appearance. An apparent loose body is noted at the superior rim of the left acetabulum. Alternatively, this could reflect a small avulsion fracture.  The visualized bowel gas pattern is grossly unremarkable in appearance.  IMPRESSION: 1. No definite evidence of fracture or dislocation. 2. Apparent loose body noted at the superior rim of the left acetabulum. Alternatively, this could reflect a small avulsion fracture.   Electronically Signed   By: Roanna Raider M.D.   On: 11/28/2013 04:08   Dg Femur Left  11/30/2013   CLINICAL DATA:  Fall, combative, confusion, groin pain  EXAM: LEFT FEMUR - 2 VIEW  COMPARISON:  11/29/2013  FINDINGS: Limited exam because of positioning and body habitus. Proximal aspect of the left femur and hip region are obscured by soft tissues and overlapping shadows. No gross displaced fracture evident. Degenerative changes noted of the left knee joint. No definite soft  tissue abnormality.  IMPRESSION: Very limited exam because of positioning.  Left knee tricompartmental osteoarthritis.   Electronically Signed   By: Ruel Favors M.D.   On: 11/30/2013 14:48   Dg Femur Left  11/29/2013   CLINICAL DATA:  Pain extending from the left hip to the knee after falling yesterday.  EXAM: LEFT FEMUR - 2 VIEW  COMPARISON:  Radiographs 12/01/2003.  FINDINGS: Stable coxa magna deformity. No evidence of acute fracture, dislocation or femoral head avascular necrosis. Os acetabuli appears unchanged. Tricompartmental degenerative changes are present at the knee, most advanced within the patellofemoral compartment.  IMPRESSION: No acute osseous findings demonstrated.   Electronically Signed   By: Roxy Horseman M.D.   On: 11/29/2013 02:01   Ct Head Wo Contrast  12/01/2013   CLINICAL DATA:  Headache and weakness. Status post fall out of bed. Skin tear at the mid forehead.  EXAM: CT HEAD WITHOUT CONTRAST  TECHNIQUE: Contiguous axial images were obtained from the base of the skull through the vertex without intravenous contrast.  COMPARISON:  CT of the head performed 11/30/2013  FINDINGS: There is no evidence of acute infarction, mass lesion, or intra- or extra-axial hemorrhage on CT.  The posterior fossa, including the cerebellum, brainstem and fourth ventricle, is within normal limits. The third and lateral ventricles, and basal ganglia are unremarkable in appearance. The cerebral hemispheres are symmetric in appearance, with normal gray-white differentiation. No mass effect or midline shift is seen.  There is no evidence of fracture; visualized osseous structures are unremarkable in appearance. Bilateral proptosis is noted. A small mucus retention cyst or polyp is noted at the left maxillary sinus. The remaining paranasal sinuses and mastoid air cells are well-aerated. Mild soft tissue swelling is noted overlying the left frontal calvarium.  IMPRESSION: 1. No evidence of traumatic intracranial  injury or fracture. 2. Mild soft tissue swelling overlying the left frontal calvarium. 3. Bilateral proptosis noted. 4. Small mucus retention cyst or polyp at the left maxillary sinus.   Electronically Signed   By: Beryle Beams.D.  On: 12/01/2013 04:58   Ct Head Wo Contrast  11/30/2013   CLINICAL DATA:  Pain post trauma ; altered mental status  EXAM: CT HEAD WITHOUT CONTRAST  TECHNIQUE: Contiguous axial images were obtained from the base of the skull through the vertex without intravenous contrast.  COMPARISON:  August 08, 2013  FINDINGS: The ventricles are normal in size and configuration. There is a rather minimal cavum septum pellucidum, an anatomic variant. There is no mass, hemorrhage, extra-axial fluid collection, or midline shift. Wallace Cullens and white compartments appear within normal limits. No apparent acute infarct. There is a small left frontal scalp hematoma. Bony calvarium appears intact. The mastoid air cells are clear. There is debris in the external auditory canal on the left.  IMPRESSION: No intracranial mass, hemorrhage, or extra-axial fluid. Gray-white compartments appear normal. Probable cerumen in the left external auditory canal.   Electronically Signed   By: Bretta Bang M.D.   On: 11/30/2013 14:34   Ct Cervical Spine Wo Contrast  12/02/2013   CLINICAL DATA:  Fall.  Evaluate for injury.  EXAM: CT CERVICAL, THORACIC, AND LUMBAR SPINE WITHOUT CONTRAST  TECHNIQUE: Multidetector CT imaging of the cervical, thoracic and lumbar spine was performed without intravenous contrast. Multiplanar CT image reconstructions were also generated.  COMPARISON:  CT cervical spine 08/08/2013 and CT chest 09/19/2012. CT pelvis 11/29/2013.  FINDINGS: CT CERVICAL SPINE FINDINGS  There is straightening of the normal cervical lordosis. Slight flattening of the C5 superior endplate is new from 08/08/2013. Vertebral body height is otherwise maintained and unchanged. Advanced degenerative disc disease at C3-4 with  marked loss of disc space height. Neural foramina appear widely patent. Visualized portions of the intracranial contents show no acute findings. Soft tissues are unremarkable.  CT THORACIC SPINE FINDINGS  Alignment is anatomic, including at the cervicothoracic junction. Vertebral body height is maintained. Scattered endplate degenerative changes. Visualized portions of the ribs show no fracture. Visualized portions of the lungs shows scattered subsegmental atelectasis or scarring. No pleural fluid.  CT LUMBAR SPINE FINDINGS  Straightening of the normal lumbar lordosis. Approximately 12 mm anterolisthesis of L5 on S1 with severe degenerative disc disease and obliteration of the joint space, as on 11/29/2013. There are chronic bilateral L5 pars defects. Vertebral body height and alignment are otherwise maintained. Endplate degenerative changes and prominent Schmorl's nodes are seen of the thoracolumbar junction as well as at L1-2 and L2-3.  Visualized portions of the kidneys and retroperitoneum show no acute findings. Bladder is distended.  IMPRESSION: 1. C5 superior endplate appears slightly compressed when compared with 08/08/2013. Otherwise, no evidence of an acute fracture or subluxation. 2. Multilevel spondylosis. 3. Grade 2 anterolisthesis of L5 on S1, secondary to chronic bilateral pars defects, with advanced secondary degenerative disc disease.   Electronically Signed   By: Leanna Battles M.D.   On: 12/02/2013 17:46   Ct Cervical Spine W Contrast  12/04/2013   CLINICAL DATA:  Cervical myelopathy. Lower extremity weakness. Spasticity.  EXAM: CT MYELOGRAPHY CERVICAL AND THORACIC SPINE  TECHNIQUE: CT imaging of the cervical and thoracic spine was performed after intrathecal contrast administration. Multiplanar CT image reconstructions were also generated.  COMPARISON:  CT cervical and lumbar spine 12/02/2013  FINDINGS: CT myelogram cervical spine:  Lumbar puncture and injection of contrast by Dr. Tyron Russell. The  patient was not able to get into the magnet for MRI.  Mild retrolisthesis at C3-4 secondary to disc and facet degeneration. Negative for fracture or mass. No bony destruction.  C2-3:  Small  central disc protrusion without cord deformity  C3-4: Marked disc degeneration with disc space narrowing endplate sclerosis and endplate cystic changes. Cervical spondylosis with diffuse uncinate spurring. Bilateral facet hypertrophy. There is moderate spinal stenosis with flattening of the cord. There is foraminal encroachment bilaterally secondary to spurring.  C4-5: Diffuse disc degeneration with a left paracentral disc protrusion causing mild flattening of the cord. Mild spinal stenosis. Neural foramina widely patent.  C5-6:  Small central disc protrusion with mild spinal stenosis noted  C6-7: Small central disc protrusion without significant spinal stenosis  C7-T1:  Negative  CT myelogram thoracic spine:  Negative for fracture or mass. No cord compression. Spinal cord morphology is normal and no mass lesion is identified in the cord.  Diffuse thoracic disc degeneration with disc space narrowing in the mid and lower thoracic spine. This is most severe at T12-L1 and L1-2 were there is marked disc space narrowing, endplate sclerosis and cystic change. There is mild osteophyte at T12-L1 and L1-2 without significant spinal stenosis or cord deformity. No disc protrusion identified.  IMPRESSION: Cervical spondylosis most severe at C3-4 where there is moderate spinal stenosis and foraminal encroachment bilaterally. There is a left paracentral disc protrusion at C4-5 with mild spinal stenosis. Mild spinal stenosis at C5-6 also noted.  Thoracic degenerative changes most prominent at T12-L1 and L1-2. No cord compression or significant thoracic spinal stenosis.   Electronically Signed   By: Marlan Palau M.D.   On: 12/04/2013 14:19   Ct Thoracic Spine Wo Contrast  12/02/2013   CLINICAL DATA:  Fall.  Evaluate for injury.  EXAM: CT  CERVICAL, THORACIC, AND LUMBAR SPINE WITHOUT CONTRAST  TECHNIQUE: Multidetector CT imaging of the cervical, thoracic and lumbar spine was performed without intravenous contrast. Multiplanar CT image reconstructions were also generated.  COMPARISON:  CT cervical spine 08/08/2013 and CT chest 09/19/2012. CT pelvis 11/29/2013.  FINDINGS: CT CERVICAL SPINE FINDINGS  There is straightening of the normal cervical lordosis. Slight flattening of the C5 superior endplate is new from 08/08/2013. Vertebral body height is otherwise maintained and unchanged. Advanced degenerative disc disease at C3-4 with marked loss of disc space height. Neural foramina appear widely patent. Visualized portions of the intracranial contents show no acute findings. Soft tissues are unremarkable.  CT THORACIC SPINE FINDINGS  Alignment is anatomic, including at the cervicothoracic junction. Vertebral body height is maintained. Scattered endplate degenerative changes. Visualized portions of the ribs show no fracture. Visualized portions of the lungs shows scattered subsegmental atelectasis or scarring. No pleural fluid.  CT LUMBAR SPINE FINDINGS  Straightening of the normal lumbar lordosis. Approximately 12 mm anterolisthesis of L5 on S1 with severe degenerative disc disease and obliteration of the joint space, as on 11/29/2013. There are chronic bilateral L5 pars defects. Vertebral body height and alignment are otherwise maintained. Endplate degenerative changes and prominent Schmorl's nodes are seen of the thoracolumbar junction as well as at L1-2 and L2-3.  Visualized portions of the kidneys and retroperitoneum show no acute findings. Bladder is distended.  IMPRESSION: 1. C5 superior endplate appears slightly compressed when compared with 08/08/2013. Otherwise, no evidence of an acute fracture or subluxation. 2. Multilevel spondylosis. 3. Grade 2 anterolisthesis of L5 on S1, secondary to chronic bilateral pars defects, with advanced secondary  degenerative disc disease.   Electronically Signed   By: Leanna Battles M.D.   On: 12/02/2013 17:46   Ct Thoracic Spine W Contrast  12/04/2013   CLINICAL DATA:  Cervical myelopathy. Lower extremity weakness. Spasticity.  EXAM:  CT MYELOGRAPHY CERVICAL AND THORACIC SPINE  TECHNIQUE: CT imaging of the cervical and thoracic spine was performed after intrathecal contrast administration. Multiplanar CT image reconstructions were also generated.  COMPARISON:  CT cervical and lumbar spine 12/02/2013  FINDINGS: CT myelogram cervical spine:  Lumbar puncture and injection of contrast by Dr. Tyron Russell. The patient was not able to get into the magnet for MRI.  Mild retrolisthesis at C3-4 secondary to disc and facet degeneration. Negative for fracture or mass. No bony destruction.  C2-3:  Small central disc protrusion without cord deformity  C3-4: Marked disc degeneration with disc space narrowing endplate sclerosis and endplate cystic changes. Cervical spondylosis with diffuse uncinate spurring. Bilateral facet hypertrophy. There is moderate spinal stenosis with flattening of the cord. There is foraminal encroachment bilaterally secondary to spurring.  C4-5: Diffuse disc degeneration with a left paracentral disc protrusion causing mild flattening of the cord. Mild spinal stenosis. Neural foramina widely patent.  C5-6:  Small central disc protrusion with mild spinal stenosis noted  C6-7: Small central disc protrusion without significant spinal stenosis  C7-T1:  Negative  CT myelogram thoracic spine:  Negative for fracture or mass. No cord compression. Spinal cord morphology is normal and no mass lesion is identified in the cord.  Diffuse thoracic disc degeneration with disc space narrowing in the mid and lower thoracic spine. This is most severe at T12-L1 and L1-2 were there is marked disc space narrowing, endplate sclerosis and cystic change. There is mild osteophyte at T12-L1 and L1-2 without significant spinal stenosis or cord  deformity. No disc protrusion identified.  IMPRESSION: Cervical spondylosis most severe at C3-4 where there is moderate spinal stenosis and foraminal encroachment bilaterally. There is a left paracentral disc protrusion at C4-5 with mild spinal stenosis. Mild spinal stenosis at C5-6 also noted.  Thoracic degenerative changes most prominent at T12-L1 and L1-2. No cord compression or significant thoracic spinal stenosis.   Electronically Signed   By: Marlan Palau M.D.   On: 12/04/2013 14:19   Ct Lumbar Spine Wo Contrast  12/02/2013   CLINICAL DATA:  Fall.  Evaluate for injury.  EXAM: CT CERVICAL, THORACIC, AND LUMBAR SPINE WITHOUT CONTRAST  TECHNIQUE: Multidetector CT imaging of the cervical, thoracic and lumbar spine was performed without intravenous contrast. Multiplanar CT image reconstructions were also generated.  COMPARISON:  CT cervical spine 08/08/2013 and CT chest 09/19/2012. CT pelvis 11/29/2013.  FINDINGS: CT CERVICAL SPINE FINDINGS  There is straightening of the normal cervical lordosis. Slight flattening of the C5 superior endplate is new from 08/08/2013. Vertebral body height is otherwise maintained and unchanged. Advanced degenerative disc disease at C3-4 with marked loss of disc space height. Neural foramina appear widely patent. Visualized portions of the intracranial contents show no acute findings. Soft tissues are unremarkable.  CT THORACIC SPINE FINDINGS  Alignment is anatomic, including at the cervicothoracic junction. Vertebral body height is maintained. Scattered endplate degenerative changes. Visualized portions of the ribs show no fracture. Visualized portions of the lungs shows scattered subsegmental atelectasis or scarring. No pleural fluid.  CT LUMBAR SPINE FINDINGS  Straightening of the normal lumbar lordosis. Approximately 12 mm anterolisthesis of L5 on S1 with severe degenerative disc disease and obliteration of the joint space, as on 11/29/2013. There are chronic bilateral L5 pars  defects. Vertebral body height and alignment are otherwise maintained. Endplate degenerative changes and prominent Schmorl's nodes are seen of the thoracolumbar junction as well as at L1-2 and L2-3.  Visualized portions of the kidneys and retroperitoneum show  no acute findings. Bladder is distended.  IMPRESSION: 1. C5 superior endplate appears slightly compressed when compared with 08/08/2013. Otherwise, no evidence of an acute fracture or subluxation. 2. Multilevel spondylosis. 3. Grade 2 anterolisthesis of L5 on S1, secondary to chronic bilateral pars defects, with advanced secondary degenerative disc disease.   Electronically Signed   By: Leanna Battles M.D.   On: 12/02/2013 17:46   Ct Pelvis Wo Contrast  11/29/2013   CLINICAL DATA:  Left hip pain after falling from bed. Mentally challenged.  EXAM: CT PELVIS WITHOUT CONTRAST  TECHNIQUE: Multidetector CT imaging of the pelvis was performed following the standard protocol without intravenous contrast.  COMPARISON:  Radiographs 12/01/2003 and 11/29/2013.  FINDINGS: There are apparent bilateral hip contractures. The utilized thin section technique is suboptimal for the patient's body habitus and these contractures, resulting in decreased signal to noise.  There is no evidence of acute fracture or dislocation. There is no evidence of femoral head avascular necrosis. 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  There is a grade 2 anterolisthesis at L5-S1 secondary to bilateral L5 pars defects. There is severe degenerative disc disease at L5-S1 with endplate sclerosis and vacuum phenomenon. There is severe biforaminal stenosis and probable bilateral L5 nerve root encroachment.  No periarticular hematoma is identified. Central prostatic calcifications and moderate bladder distention are noted. The visualized internal pelvic contents are otherwise unremarkable.  IMPRESSION: 1. No demonstrated acute  osseous findings in the pelvis. 2. 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 3. Bilateral L5 pars defects with resulting grade 2 anterolisthesis and severe biforaminal stenosis at L5-S1. There is probable bilateral L5 nerve root encroachment. 4. Central prostatic calcifications.   Electronically Signed   By: Roxy Horseman M.D.   On: 11/29/2013 02:14   Dg Myelography Lumbar Inj Multi Region  12/05/2013   CLINICAL DATA:  BILATERAL lower extremity weakness, hyperreflexia with up going toes, question cord compression, cervical spinal stenosis on noncontrast CT, inability to have MRI  FLUOROSCOPY TIME:  dictate in minutes and seconds  PROCEDURE: LUMBAR PUNCTURE FOR CERVICAL AND THORACIC MYELOGRAM  After thorough discussion of risks and benefits of the procedure including bleeding, infection, injury to nerves, blood vessels, adjacent structures as well as headache and CSF leak, written and oral informed consent was obtained. Timeout protocol followed.  Due to patient inability to extend hips and lie prone, patient was placed in a RIGHT lower decubitus position. Patient prepped and draped in usual sterile fashion. Local anesthesia was provided with 3 mL of 1% lidocaine without epinephrine. Lumbar puncture was performed at L3-L4 using a 3 1/2 inch 22-gauge spinal needle via a midline approach. Using a single pass through the dura, the needle was placed within the thecal sac, with return of clear colorless CSF. Opening pressure 18 cm H2O. 8 mL of CSF was obtained for requested laboratory analysis. 10 mL of Omnipaque-300 was injected into the thecal sac, with normal opacification of the nerve roots and cauda equina consistent with free flow within the subarachnoid space. The patient was then moved to the Trendelenburg position and contrast flowed into the thoracic and cervical spine.  I personally performed the lumbar puncture and administered the intrathecal contrast.  TECHNIQUE: Contiguous  axial images were obtained through the Cervical and Thoracic spine after the intrathecal infusion of infusion. Coronal and sagittal reconstructions were obtained of the axial image sets. Cervical and thoracic CT myelography images are  reported separately.  FINDINGS: Free flow of contrast was identified from the the injection site at the lumbar spine to the cervical spine.  Indentation of the ventral aspect of the thecal sac is identified at the L1-L2 level by bulging disc and endplate spur formation, please refer to CT.  Grade 2 anterolisthesis L5-S1.  No immediate complications.  IMPRESSION: Lumbar puncture and contrast injection for cervical and thoracic CT myelography as above.   Electronically Signed   By: Ulyses Southward M.D.   On: 12/05/2013 08:19    CBC  Recent Labs Lab 12/02/13 1234 12/03/13 0543 12/07/13 0455  WBC 11.1* 9.4 7.4  HGB 11.7* 11.8* 11.1*  HCT 37.0* 37.0* 34.8*  PLT 237 217 248  MCV 100.0 101.6* 100.3*  MCH 31.6 32.4 32.0  MCHC 31.6 31.9 31.9  RDW 14.9 14.9 14.2  LYMPHSABS 2.1  --   --   MONOABS 1.0  --   --   EOSABS 0.1  --   --   BASOSABS 0.0  --   --     Chemistries   Recent Labs Lab 12/03/13 0543 12/04/13 0717 12/05/13 0556 12/07/13 0455 12/08/13 0347 12/09/13 0335  NA 150* 147 148* 145 147 146  K 4.7 5.1 5.5* 5.0 4.6 4.5  CL 116* 113* 114* 108 108 109  CO2 24 25 24 26 27 27   GLUCOSE 72 90 157* 167* 159* 162*  BUN 19 16 21  32* 31* 31*  CREATININE 1.21 1.13 1.19 1.12 1.12 1.15  CALCIUM 8.8 8.8 9.0 8.8 8.7 8.8  AST 66*  --   --   --   --   --   ALT 41  --   --   --   --   --   ALKPHOS 106  --   --   --   --   --   BILITOT 0.3  --   --   --   --   --    ------------------------------------------------------------------------------------------------------------------ estimated creatinine clearance is 75.5 ml/min (by C-G formula based on Cr of  1.15). ------------------------------------------------------------------------------------------------------------------ No results found for this basename: HGBA1C,  in the last 72 hours ------------------------------------------------------------------------------------------------------------------ No results found for this basename: CHOL, HDL, LDLCALC, TRIG, CHOLHDL, LDLDIRECT,  in the last 72 hours ------------------------------------------------------------------------------------------------------------------ No results found for this basename: TSH, T4TOTAL, FREET3, T3FREE, THYROIDAB,  in the last 72 hours ------------------------------------------------------------------------------------------------------------------ No results found for this basename: VITAMINB12, FOLATE, FERRITIN, TIBC, IRON, RETICCTPCT,  in the last 72 hours  Coagulation profile No results found for this basename: INR, PROTIME,  in the last 168 hours  No results found for this basename: DDIMER,  in the last 72 hours  Cardiac Enzymes  Recent Labs Lab 12/03/13 0543  CKMB <1.0   ------------------------------------------------------------------------------------------------------------------ No components found with this basename: POCBNP,     Ghada Abbett D.O. on 12/09/2013 at 10:53 AM  Between 7am to 7pm - Pager - [phone]  After 7pm go to [email or web address] - password [email or web address]  And look for the night coverage person covering for me after hours  [facility] Office  [phone]

## 2013-12-09 NOTE — Progress Notes (Signed)
PT Cancellation Note  Patient Details Name: Joshua BishopMichael D Mahaffy MRN: [id number] DOB: Aug 12, 1965   Cancelled Treatment:    Reason Eval/Treat Not Completed: Patient at procedure or test/unavailable. Pt off the floor in surgery. PT to return s/p surgery when re-ordered by MD.    Marcene Brawnhadwell, Marina Boerner Marie 12/09/2013, 9:09 AM  Lewis ShockAshly Hermie Reagor, PT, DPT Pager #: [phone] Office #: [phone]

## 2013-12-09 NOTE — Transfer of Care (Signed)
Immediate Anesthesia Transfer of Care Note  Patient: [name]  Procedure(s) Performed: Procedure(s): MRI OF LUMBAR W/WO CONTRAST  (N/A)  Patient Location: PACU  Anesthesia Type:General  Level of Consciousness: awake, alert  and confused  Airway & Oxygen Therapy: Patient Spontanous Breathing and Patient connected to nasal cannula oxygen  Post-op Assessment: Report given to PACU RN and Post -op Vital signs reviewed and stable  Post vital signs: Reviewed and stable  Complications: No apparent anesthesia complications

## 2013-12-10 ENCOUNTER — Other Ambulatory Visit (HOSPITAL_COMMUNITY): Payer: Self-pay

## 2013-12-10 DIAGNOSIS — M6282 Rhabdomyolysis: Principal | ICD-10-CM

## 2013-12-10 LAB — BASIC METABOLIC PANEL
Anion gap: 11 (ref 5–15)
BUN: 33 mg/dL — ABNORMAL HIGH (ref 6–23)
CALCIUM: 8.5 mg/dL (ref 8.4–10.5)
CHLORIDE: 110 meq/L (ref 96–112)
CO2: 28 mEq/L (ref 19–32)
CREATININE: 1.24 mg/dL (ref 0.50–1.35)
GFR calc non Af Amer: 68 mL/min — ABNORMAL LOW (ref >90)
GFR, EST AFRICAN AMERICAN: 79 mL/min — AB (ref >90)
Glucose, Bld: 142 mg/dL — ABNORMAL HIGH (ref 70–99)
Potassium: 5.1 mEq/L (ref 3.7–5.3)
Sodium: 149 mEq/L — ABNORMAL HIGH (ref 137–147)

## 2013-12-10 MED ORDER — DIAZEPAM 5 MG PO TABS: 5.0000 mg | ORAL_TABLET | Freq: Two times a day (BID) | ORAL | Status: DC

## 2013-12-10 MED ORDER — POLYETHYLENE GLYCOL 3350 17 G PO PACK: 17.0000 g | PACK | Freq: Two times a day (BID) | ORAL | Status: AC

## 2013-12-10 MED ORDER — HYDROCODONE-ACETAMINOPHEN 5-325 MG PO TABS: 1.0000 | ORAL_TABLET | Freq: Four times a day (QID) | ORAL | Status: DC | PRN

## 2013-12-10 MED ORDER — DEXTROSE 5 % IV SOLN
INTRAVENOUS | Status: AC
  Administered 2013-12-10 (×2): via INTRAVENOUS

## 2013-12-10 MED ORDER — BACLOFEN 5 MG HALF TABLET: 5.0000 mg | ORAL_TABLET | Freq: Three times a day (TID) | ORAL | Status: DC

## 2013-12-10 MED ORDER — BACLOFEN 10 MG PO TABS
5.0000 mg | ORAL_TABLET | Freq: Three times a day (TID) | ORAL | Status: DC
  Administered 2013-12-11: 5 mg via ORAL
  Filled 2013-12-10: qty 1

## 2013-12-10 NOTE — Progress Notes (Signed)
Occupational Therapy Treatment Patient Details Name: [name] MRN: [id number] DOB: 05/12/1965 Today's Date: 12/10/2013    History of present illness  48 y.o. male [facility] with multiple falls one from bed level and two from w/c seated position. Pt has been w/c dependent due to LE weakness. Prior to this he had been ambulating with a walker. [facility] CT reveals probable fracture in low thoracic and upper lumbar spine and anterolisthesis of L5 on S1; imaging difficulty due to flexed position.  Pt possibly being sent for myelogram of spine per neurology. PMH developmental disability, obstructive sleep apnea, COPD,  tobacco abuse, acute respiratory failure with hypoxia secondary to obstructive sleep apnea and opiate-induced altered mentation.  Pt underwent MRI under anesthesia on 12/09/12 and MRI revealed multifactorial moderate to severe degenerative spinal stenosis at L4-L5 and L5-S1. Severe left lateral recess stenosis at the descending left L5 nerve root levels. Moderate left L4 and severe bilateral L5 foraminal stenosis.  Neurosurgery consulted and is treating issues conservatively (non-surgically).     OT comments  Pt saturated in urine when OT arrived. Assisted in helping clean pt. Pt able to participate in grooming tasks and part of LB bathing.   Follow Up Recommendations  SNF;Supervision/Assistance - 24 hour    Equipment Recommendations  Wheelchair cushion (measurements OT);Wheelchair (measurements OT);Hospital bed    Recommendations for Other Services      Precautions / Restrictions Precautions Precautions: Fall Precaution Comments: h/o mult falls at  ALF Restrictions Weight Bearing Restrictions: No       Mobility Bed Mobility               General bed mobility comments: not assessed; pt in chair. Did roll side to side in chair to get pt cleaned up from urine-Max A  Transfers Overall transfer  level: Needs assistance   Transfers: Sit to/from Stand Sit to Stand: +2 physical assistance;Total assist         General transfer comment: Attempted to stand from chair. Cues for hand placement. Feet not in great position upon standing and returned pt to sitting position.     Balance                                   ADL Overall ADL's : Needs assistance/impaired     Grooming: Applying deodorant;Brushing hair;Sitting;Moderate assistance       Lower Body Bathing: Sitting/lateral leans;Maximum assistance (recliner chair and pt assisted in washing buttocks with chair reclined and feet elevated )   Upper Body Dressing : Minimal assistance;Sitting (gown)       Toilet Transfer: +2 for physical assistance;Total assistance (sit to stand)             General ADL Comments: Pt in chair upon OT's arrival and saturated in urine. Assisted in cleaning it up and pt washed some of LB-OT assisted in helping clean more thoroughly. Tried to stand from chair but could hardly clear chair. Rolled in chair to get new pads under pt.      Vision                     Perception     Praxis      Cognition   Behavior During Therapy: Flat affect Overall Cognitive Status: History of cognitive impairments - at baseline  Extremity/Trunk Assessment               Exercises     Shoulder Instructions       General Comments      Pertinent Vitals/ Pain       No apparent distress.  Home Living                                          Prior Functioning/Environment              Frequency Min 2X/week     Progress Toward Goals  OT Goals(current goals can now be found in the care plan section)  Progress towards OT goals: Progressing toward goals  Acute Rehab OT Goals Patient Stated Goal: none stated OT Goal Formulation: Patient unable to participate in goal setting Time For Goal Achievement:  12/27/2013 Potential to Achieve Goals: Fair  Plan Discharge plan remains appropriate    Co-evaluation                 End of Session Equipment Utilized During Treatment: Gait belt;Oxygen   Activity Tolerance Patient tolerated treatment well   Patient Left in chair;with call bell/phone within reach;with chair alarm set   Nurse Communication Other (comment) (tech assisted)        Time: 1610-9604 OT Time Calculation (min): 21 min  Charges: OT General Charges $OT Visit: 1 Procedure OT Treatments $Self Care/Home Management : 8-22 mins  Earlie Raveling OTR/L [phone] 12/10/2013, 4:35 PM

## 2013-12-10 NOTE — Progress Notes (Signed)
Placed patient on CPAP for the night a 8cmH20

## 2013-12-10 NOTE — Progress Notes (Signed)
[facility] PRO[facility]ESS NOTE Interim History: 48 y.o. male with a history of developmental disability, obstructive sleep apnea, COPD, and tobacco abuse comes in with multiple falls. Patient was sent to [facility] for further evaluation by neurosurgery. Patient continues to have weakness in the LE B/L, but improving. He had been treated conservatively Patient is to have MRI of the lumbar spine on 8.4.2015 under anestesia   Assessment/Plan: Multiple falls with gait disorder, possibly related to cervical myelopathy  - Patient had multiple falls at assisted living. - CT of the head was negative on admission  - CT myelogram  findings are indicative of severe canal stenosis with encroachment on the spinal cord at C3-C4.  - Dr.Nundkumar was consulted and recommended MRI of the lumbar spine as well as anti-spasmodic medications for his leg cramps and spasms and cont conservative management. - MRI lumbar spine was done with full sedation revealed: Multifactorial moderate to severe degenerative spinal stenosis at L4-L5 and L5-S1. Severe left lateral recess stenosis at the descending left L5 nerve root levels. Moderate left L4 and severe bilateral L5 foraminal stenosis - EEG was refused by patient  - SNF when more awake. Pt lethargic ? Combination of medications and MRI under anesthesia. - Hold narcotics and sedatives, hold baclofen.  Bilateral leg pain  - Per patient, improving  - Patient was empirically given 1 g of Solu-Medrol daily however her spasticity and pain persists  - Neurosurgery recommended MRI of lumbar spine, conducted under anesthesia with result as above.  Mild rhabdomyolysis  -Likely secondary to falls  -CPK levels have normalized   Dehydration with hypernatremia  - Improved with hydration on admission. - Now worsening restart D5. Recheck a b-met in am.  Obstructive sleep apnea  -Continue CPAP however patient's has been declining   COPD with tobacco abuse  -Continue  inhalers  -Patient declines nicotine patch  -Smoking cessation counseling given   Mild Leukocytosis  -Resolved, Likely secondary to stress reaction versus steroids  -No evidence of active infection   Macrocytic Anemia  -Hemoglobin remained stable at 11.8  -Anemia panel was normal last year  -Thiamine B12 and TSH are within normal limits   Developmental disability  -Stable   Sinus tachycardia  -Likely secondary to pain, TSH normal   Code Status: full Family Communication: none  Disposition Plan: inpatient   Consultants:  neurosurgery  Procedures:  MRI lumbar  CT c-spine  Antibiotics:  none  HPI/Subjective: Pt lethargic.  Objective: Filed Vitals:   12/09/13 2158 12/10/13 0213 12/10/13 0500 12/10/13 0612  BP: 126/79 113/67  116/67  Pulse: 92 77  94  Temp: 97.7 F (36.5 C) 99.9 F (37.7 C)  98 F (36.7 C)  TempSrc: Oral Axillary  Axillary  Resp: 16 16  16   Height:      Weight:   85.775 kg (189 lb 1.6 oz)   SpO2: 95%   94%    Intake/Output Summary (Last 24 hours) at 12/10/13 0850 Last data filed at 12/10/13 0500  Gross per 24 hour  Intake    480 ml  Output   1000 ml  Net   -520 ml   Filed Weights   12/07/13 0500 12/09/13 0500 12/10/13 0500  Weight: 87.227 kg (192 lb 4.8 oz) 86.002 kg (189 lb 9.6 oz) 85.775 kg (189 lb 1.6 oz)    Exam:  General: lethargic,in no acute distress.  HEENT: No bruits, no goiter.  Heart: Regular rate and rhythm, without murmurs, rubs, gallops.  Lungs: Good air  movement, bilateral air movement.  Abdomen: Soft, nontender, nondistended, positive bowel sounds.  Neuro: Grossly intact, nonfocal.   Data Reviewed: Basic Metabolic Panel:  Recent Labs Lab 12/05/13 0556 12/07/13 0455 12/08/13 0347 12/09/13 0335 12/10/13 0621  NA 148* 145 147 146 149*  K 5.5* 5.0 4.6 4.5 5.1  CL 114* 108 108 109 110  CO2 24 26 27 27 28   GLUCOSE 157* 167* 159* 162* 142*  BUN 21 32* 31* 31* 33*  CREATININE 1.19 1.12 1.12 1.15 1.24    CALCIUM 9.0 8.8 8.7 8.8 8.5   Liver Function Tests: No results found for this basename: AST, ALT, ALKPHOS, BILITOT, PROT, ALBUMIN,  in the last 168 hours No results found for this basename: LIPASE, AMYLASE,  in the last 168 hours No results found for this basename: AMMONIA,  in the last 168 hours CBC:  Recent Labs Lab 12/07/13 0455  WBC 7.4  HGB 11.1*  HCT 34.8*  MCV 100.3*  PLT 248   Cardiac Enzymes: No results found for this basename: CKTOTAL, CKMB, CKMBINDEX, TROPONINI,  in the last 168 hours BNP (last 3 results) No results found for this basename: PROBNP,  in the last 8760 hours CBG:  Recent Labs Lab 12/03/13 2200 12/08/13 1619  GLUCAP 81 148*    No results found for this or any previous visit (from the past 240 hour(s)).   Studies: Mr Lumbar Spine W Wo Contrast  12/09/2013   CLINICAL DATA:  48 year old male with fall in July, history of lumbar spondylosis, lower extremity weakness and spasticity. Query cauda equina compression. Initial encounter.  EXAM: MRI LUMBAR SPINE WITHOUT AND WITH CONTRAST  TECHNIQUE: Multiplanar and multiecho pulse sequences of the lumbar spine were obtained without and with intravenous contrast.  CONTRAST:  6m MULTIHANCE GADOBENATE DIMEGLUMINE 529 MG/ML IV SOLN  [facility]. Lumbar spine CT 12/02/2013.  FINDINGS: Stable visualized lower thoracic spine and lumbar spine vertebral height and alignment from the recent CTs. Chronic anterolisthesis at L5-S1 with chronic L5 pars fractures, better demonstrated by CT. Endplate marrow signal changes at L5-S1, including suggestion of some marrow edema (series 500, image 8), but largely chronic. Similar marrow signal changes at the L1-L2 endplates. Visible sacral ala intact.  Stable appearance of the lower thoracic spinal cord and conus, tip of the conus at L2. No lower spinal cord or conus signal abnormality. See T12-L1 and L1-L2 findings below.   Distended bladder. Subcutaneous edema in the posterior paraspinal soft tissues. Paraspinal musculature within normal limits.  T10-T11:  Stable from recent myelogram, no spinal stenosis.  T11-T12:  Stable from recent myelogram, no spinal stenosis.  T12-L1: Disc space loss. Mild circumferential disc osteophyte complex. Borderline to mild spinal stenosis. No cord mass effect or signal abnormality.  L1-L2: Severe disc space loss. Vacuum disc phenomena. Degenerative endplate changes. Circumferential disc osteophyte complex. Borderline to mild spinal stenosis, no cord mass effect or signal abnormality.  L2-L3: Small right paracentral disc protrusion. No spinal or lateral recess stenosis. Mild left greater than right L2 foraminal stenosis.  L3-L4:  Mild epidural lipomatosis.  L4-L5: Broad-based left paracentral disc protrusion. Epidural lipomatosis. Superimposed moderate facet hypertrophy. Up to moderate spinal stenosis and severe left lateral recess stenosis (series 900, image 26). Moderate left L4 foraminal stenosis.  L5-S1: Anterolisthesis. Advanced disc and endplate degeneration. Broad-based pseudo disc bulge. Moderate to severe facet hypertrophy. Chronic L5 pars fractures better depicted by CT. Epidural lipomatosis. Severe multifactorial spinal stenosis (series 900, image  28). Severe bilateral L5 foraminal stenosis.  IMPRESSION: 1. Multifactorial moderate to severe degenerative spinal stenosis at L4-L5 and L5-S1. Severe left lateral recess stenosis at the descending left L5 nerve root levels. Moderate left L4 and severe bilateral L5 foraminal stenosis. 2. Chronic L5-S1 spondylolisthesis and spondylolysis. Mild marrow edema at the L5-S1 endplates, and also at L1-L2, appears to be degenerative in nature rather than posttraumatic. 3. Stable borderline to mild degenerative spinal stenosis at the level of the conus medullaris, T12-L1 and L1-L2. No lower thoracic spinal cord or conus mass effect or signal abnormality. 4.  Distended bladder.   Electronically Signed   By: Lars Pinks M.D.   On: 12/09/2013 13:38    Scheduled Meds: . baclofen  5 mg Oral TID  . budesonide  0.25 mg Nebulization BID  . diazepam  10 mg Oral QID  . heparin subcutaneous  5,000 Units Subcutaneous 3 times per day  . methylPREDNISolone (SOLU-MEDROL) injection  1,000 mg Intravenous Q24H  . senna  1 tablet Oral QHS   Continuous Infusions: . dextrose 75 mL/hr at 12/07/13 1922  . lactated ringers 50 mL/hr at 12/10/13 0034     Charlynne Cousins  [facility] Pager [phone]. If 8PM-8AM, please contact night-coverage at [email or web address], password [email or web address] 12/10/2013, 8:50 AM  LOS: 8 days      **Disclaimer: This note may have been dictated with voice recognition software. Similar sounding words can inadvertently be transcribed and this note may contain transcription errors which may not have been corrected upon publication of note.**

## 2013-12-10 NOTE — Progress Notes (Signed)
No issues overnight. Pt continues to report some pain in his thighs, but better today. No neck or arm symptoms. No back pain.  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 The spondylolisthesis at L5-S1 would not explain this, and there is no significant amount of central stenosis above this in the lumbar or thoracic spine to explain his symptoms. In addition, the disc/osteophytes at C3-4 and C4-5 seen on myelogram I do not believe are severe enough to cause his spasticity/paraparesis. He also does not have clinical upper extremity symptoms. I therefore do not believe surgically addressing either the cervical or lumbar spine would offer any significant clinical benefit.  PLAN: - Cont to treat his spasticity medically, baclofen added.  May consider PM&R consult possibly as an outpatient for eval of spasticity. - PT/OT eval, likely transfer back to assisted living facility if otherwise medically stable.

## 2013-12-10 NOTE — Progress Notes (Signed)
Physical Therapy Treatment Patient Details Name: [name] MRN: [id number] DOB: 12-30-1965 Today's Date: 12/10/2013    History of Present Illness  48 y.o. male admitted transfer from [facility] on 12/02/13 with multiple falls one from bed level and two from w/c seated position. Pt has been w/c dependent due to LE weakness. Prior to this he had been ambulating with a walker. Pt from [facility] assisted living facility. CT reveals probable fracture in low thoracic and upper lumbar spine and anterolisthesis of L5 on S1; imaging difficulty due to flexed position.  Pt possibly being sent for myelogram of spine per neurology. PMH developmental disability, obstructive sleep apnea, COPD,  tobacco abuse, acute respiratory failure with hypoxia secondary to obstructive sleep apnea and opiate-induced altered mentation.  Pt underwent MRI under anesthesia on 12/09/12 and MRI revealed multifactorial moderate to severe degenerative spinal stenosis at L4-L5 and L5-S1. Severe left lateral recess stenosis at the descending left L5 nerve root levels. Moderate left L4 and severe bilateral L5 foraminal stenosis.  Neurosurgery consulted and is treating issues conservatively (non-surgically).      PT Comments    Pt seems to be in less pain with mobility, but very sleepy today.  With two person assist we were able to clear his buttocks from the bed blocking his feet from sliding more than his knees.  We did use the maxi move total lift for both pt and therapist safety for OOB to chair.  PT will continue to follow acutely.   Follow Up Recommendations  SNF     Equipment Recommendations  Wheelchair (measurements PT);Wheelchair cushion (measurements PT);Hospital bed;Other (comment) (hoyer lift)    Recommendations for Other Services   NA     Precautions / Restrictions Precautions Precautions: Fall Precaution Comments: h/o mult falls at  ALF    Mobility  Bed Mobility Overal bed mobility: Needs Assistance Bed  Mobility: Rolling;Sidelying to Sit Rolling: Max assist;+2 for physical assistance Sidelying to sit: +2 for physical assistance;Max assist       General bed mobility comments: Pt reaching for bed rail while rolling, therapist initiating movement to roll using bed pad and having pt reach across to railing with manual assist bil.  Pt using arms and some tone in his legs to attempt to help get to sitting EOB without much success  Transfers Overall transfer level: Needs assistance Equipment used: None Transfers: Sit to/from Stand Sit to Stand: Total assist;+2 physical assistance         General transfer comment: Two person total assist to attempt standing.  with initiation of forward momentum pt's legs throw back into extensor tone.  Feet more than knees need to be blocked to prevent sliding forward off of the bed.  We were able to clear his buttocks from the bed and attempted standing x2 with two person assist at his trunk.  Maxi move used (as maxi sky was not working) to transfer from bed to [employer] chair- this was the safest method for both pt and therapists.    Ambulation/Gait             General Gait Details: unable          Balance Overall balance assessment: Needs assistance Sitting-balance support: Feet supported;Bilateral upper extremity supported Sitting balance-Leahy Scale: Poor Sitting balance - Comments: Min assist to prevent posterior LOB and then anterior LOB EOB.  Pt's feet also need to be blocked to prevent forward progression of hips to EOB when his extensor tone kicks in.  Postural control: Posterior lean Standing balance support: Bilateral upper extremity supported Standing balance-Leahy Scale: Zero Standing balance comment: total assist at attempts to stand                    Cognition Arousal/Alertness: Lethargic Behavior During Therapy: Flat affect Overall Cognitive Status: History of cognitive impairments - at baseline                          General Comments General comments (skin integrity, edema, etc.): Inconinent of urine, RN tech informed that his condom cath was off.  On O2 4 L [place], left on most of session, unplugged for transfer/line management during maxi move lift to chair and re-applied immediately after.       Pertinent Vitals/Pain See vitals flow sheet.            PT Goals (current goals can now be found in the care plan section) Acute Rehab PT Goals Patient Stated Goal: none stated Progress towards PT goals: Progressing toward goals    Frequency  Min 3X/week    PT Plan Current plan remains appropriate       End of Session Equipment Utilized During Treatment: Gait belt;Oxygen Activity Tolerance: Patient limited by fatigue Patient left: in chair;with call bell/phone within reach;with chair alarm set     Time: [id number] PT Time Calculation (min): 28 min  Charges:  $Therapeutic Activity: 23-37 mins                      Lotus Santillo B. Brettney Ficken, PT, DPT [phone]   12/10/2013, 12:02 PM

## 2013-12-10 NOTE — Discharge Summary (Signed)
Physician Discharge Summary  [name] FIE:[id number] DOB: [birth year] DOA: 12/02/2013  PCP: Default, Provider, MD  Admit date: 12/02/2013 Discharge date: 12/11/2013  Time spent: 35 minutes  Recommendations for Outpatient Follow-up:  1. Follow up with PCP in 2-4 weeks.  Discharge Diagnoses:  Principal Problem:   Multiple falls Active Problems:   OSA (obstructive sleep apnea)   Dehydration with hypernatremia   Rhabdomyolysis   Bilateral leg pain   Skin abrasion   COPD with chronic bronchitis   Tobacco abuse   Degenerative joint disease of spine   Lumbosacral spondylosis without myelopathy   Cervical spondylosis with myelopathy   Discharge Condition: stable  Diet recommendation: heart healthy  Filed Weights   12/09/13 0500 12/10/13 0500 12/11/13 0521  Weight: 86.002 kg (189 lb 9.6 oz) 85.775 kg (189 lb 1.6 oz) 86.818 kg (191 lb 6.4 oz)    History of present illness:  Reason-year-old with history of disability, this apnea, COPD comes in after several falls and passing out at the skilled nursing facility. There were no seizure activities. He had been on conservative management for his bilateral lower extremity pain but he fell out of the wheelchair on the 25th and 26 of last month CT of the head was negative who we are consulted for further evaluation.  Hospital Course:  Multiple falls with gait disorder, possibly related to cervical myelopathy  - Patient had multiple falls at assisted living.  - CT of the head was negative on admission  - CT myelogram findings are indicative of severe canal stenosis with encroachment on the spinal cord at C3-C4.  - Dr.Nundkumar was consulted and recommended MRI of the lumbar spine as well as anti-spasmodic medications for his leg cramps and spasms and cont conservative management.  - MRI lumbar spine was done with full sedation revealed: Multifactorial moderate to severe degenerative spinal stenosis at L4-L5 and L5-S1. Severe left lateral  recess stenosis at the descending left L5 nerve root levels. Moderate left L4 and severe bilateral L5 foraminal stenosis  - EEG was refused by patient  - Pt was lethargic at some point in the hospital, ? Combination of medications and MRI under anesthesia.  - held narcotics and baclofen and resolved resume as an outpatient at a lower dose.  Bilateral leg pain  - Per patient, improving  - Patient was empirically given 1 g of Solu-Medrol daily however her spasticity and pain persists  - Neurosurgery recommended MRI of lumbar spine, conducted under anesthesia with result as above.   Mild rhabdomyolysis  -Likely secondary to falls  -CPK levels have normalized   Dehydration with hypernatremia  - Improved with hydration on admission.  - Now worsening restart D5. Recheck a b-met in am.   Obstructive sleep apnea  -Continue CPAP however patient's has been declining   COPD with tobacco abuse  -Continue inhalers  -Patient declines nicotine patch  -Smoking cessation counseling given   Mild Leukocytosis  -Resolved, Likely secondary to stress reaction versus steroids  -No evidence of active infection   Macrocytic Anemia  -Hemoglobin remained stable at 11.8  -Anemia panel was normal last year  -Thiamine B12 and TSH are within normal limits    Procedures:  MRI lumbar spine  CT c-spine   Consultations:  NEurosurgery  Discharge Exam: Filed Vitals:   12/11/13 0521  BP: 107/60  Pulse: 83  Temp: 97.6 F (36.4 C)  Resp: 20    General: A&O x1 Cardiovascular: RRR Respiratory: good air movement CTA B/L  Discharge Instructions  You were cared for by a hospitalist during your hospital stay. If you have any questions about your discharge medications or the care you received while you were in the hospital after you are discharged, you can call the unit and asked to speak with the hospitalist on call if the hospitalist that took care of you is not available. Once you are discharged,  your primary care physician will handle any further medical issues. Please note that NO REFILLS for any discharge medications will be authorized once you are discharged, as it is imperative that you return to your primary care physician (or establish a relationship with a primary care physician if you do not have one) for your aftercare needs so that they can reassess your need for medications and monitor your lab values.      Discharge Instructions   Diet - low sodium heart healthy    Complete by:  As directed      Increase activity slowly    Complete by:  As directed             Medication List         albuterol (5 MG/ML) 0.5% nebulizer solution  Commonly known as:  PROVENTIL  Take 0.5 mLs (2.5 mg total) by nebulization every 6 (six) hours as needed for wheezing.     baclofen 5 mg Tabs tablet  Commonly known as:  LIORESAL  Take 0.5 tablets (5 mg total) by mouth 3 (three) times daily.     budesonide 180 MCG/ACT inhaler  Commonly known as:  PULMICORT  Inhale into the lungs 2 (two) times daily.     budesonide 0.25 MG/2ML nebulizer solution  Commonly known as:  PULMICORT  Take 2 mLs (0.25 mg total) by nebulization 2 (two) times daily.     cyclobenzaprine 10 MG tablet  Commonly known as:  FLEXERIL  Take 1 tablet (10 mg total) by mouth 2 (two) times daily as needed for muscle spasms.     HYDROcodone-acetaminophen 5-325 MG per tablet  Commonly known as:  NORCO/VICODIN  Take 1 tablet by mouth every 4 (four) hours as needed for moderate pain.     ibuprofen 800 MG tablet  Commonly known as:  ADVIL,MOTRIN  Take 800 mg by mouth every 8 (eight) hours as needed.       No Known Allergies Follow-up Information   Schedule an appointment as soon as possible for a visit with Sherrie Mustache, MD.   Specialty:  Family Medicine   Contact information:   [place]  [address] [phone]        The results of significant diagnostics from this hospitalization  (including imaging, microbiology, ancillary and laboratory) are listed below for reference.    Significant Diagnostic Studies: Dg Lumbar Spine 2-3 Views  12/02/2013   CLINICAL DATA:  Pain post trauma  EXAM: LUMBAR SPINE - 2-3 VIEW  COMPARISON:  Report of prior lumbar series June 18, 2011 available ; images from that study cannot be retrieved at this time.  FINDINGS: Frontal and lateral views were obtained. There are 5 non-rib-bearing lumbar type vertebral bodies. There is lumbar levoscoliosis. There is remodeling of the T11, T12, L1, and L2 vertebral bodies with what appears to be chronic wedging at these levels. No acute appearing fracture is seen. There is 1.7 cm of anterolisthesis of L5 on S1. No other spondylolisthesis is seen.  There is moderately severe disc space narrowing at all levels. No erosive change.  IMPRESSION: Probable fractures in the  lower thoracic and upper lumbar region. 1.7 cm of anterolisthesis of L5 on S1. By report, there was 1.1 cm of anterolisthesis of L5 on S1 on the previous study. Suspect progression of arthropathy as the cause of the increase in spondylolisthesis, although traumatic etiology cannot be excluded. There is scoliosis as well as multilevel arthropathy.   Electronically Signed   By: Lowella Grip M.D.   On: 12/02/2013 09:47   Dg Pelvis 1-2 Views  11/30/2013   CLINICAL DATA:  Pelvic pain.  EXAM: PELVIS - 1-2 VIEW  COMPARISON:  None.  FINDINGS: Please note that the patient was very combative and refused to comply with the examination. A limited view of the pelvis was obtained.  No acute bony abnormalities are noted.  Degenerative changes in the lower lumbar spine a present.  Please note that the inferior portion the pelvis left hip are off the field of view.  IMPRESSION: Limited evaluation secondary to patient's inability to comply with examination. No acute bony abnormalities noted.   Electronically Signed   By: Hassan Rowan M.D.   On: 11/30/2013 14:49   Dg Hip  Complete Left  12/02/2013   CLINICAL DATA:  Left hip pain status post fall  EXAM: LEFT HIP - COMPLETE 2+ VIEW  COMPARISON:  None.  FINDINGS: There is no obvious hip fracture or dislocation. There is no lytic or blastic osseous lesion. Examination is limited secondary to difficulty in patient positioning secondary to pain.  Degenerative changes of the lower lumbar spine.  IMPRESSION: 1. Limited exam secondary to suboptimal positioning. No evidence of acute fracture or dislocation. If there is persistent clinical concern, and patient is unable to tolerate satisfactory positioning further evaluation with MRI may be helpful.   Electronically Signed   By: Kathreen Devoid   On: 12/02/2013 09:45   Dg Hip Complete Left  11/28/2013   CLINICAL DATA:  Status post fall; left hip pain.  EXAM: LEFT HIP - COMPLETE 2+ VIEW  COMPARISON:  None.  FINDINGS: There is no evidence of fracture or dislocation. Both femoral heads are seated normally within their respective acetabula. The proximal left femur appears intact. No significant degenerative change is appreciated. The sacroiliac joints are unremarkable in appearance. An apparent loose body is noted at the superior rim of the left acetabulum. Alternatively, this could reflect a small avulsion fracture.  The visualized bowel gas pattern is grossly unremarkable in appearance.  IMPRESSION: 1. No definite evidence of fracture or dislocation. 2. Apparent loose body noted at the superior rim of the left acetabulum. Alternatively, this could reflect a small avulsion fracture.   Electronically Signed   By: Garald Balding M.D.   On: 11/28/2013 04:08   Dg Femur Left  11/30/2013   CLINICAL DATA:  Fall, combative, confusion, groin pain  EXAM: LEFT FEMUR - 2 VIEW  COMPARISON:  11/29/2013  FINDINGS: Limited exam because of positioning and body habitus. Proximal aspect of the left femur and hip region are obscured by soft tissues and overlapping shadows. No gross displaced fracture evident.  Degenerative changes noted of the left knee joint. No definite soft tissue abnormality.  IMPRESSION: Very limited exam because of positioning.  Left knee tricompartmental osteoarthritis.   Electronically Signed   By: Daryll Brod M.D.   On: 11/30/2013 14:48   Dg Femur Left  11/29/2013   CLINICAL DATA:  Pain extending from the left hip to the knee after falling yesterday.  EXAM: LEFT FEMUR - 2 VIEW  COMPARISON:  Radiographs 12/01/2003.  FINDINGS: Stable coxa magna deformity. No evidence of acute fracture, dislocation or femoral head avascular necrosis. Os acetabuli appears unchanged. Tricompartmental degenerative changes are present at the knee, most advanced within the patellofemoral compartment.  IMPRESSION: No acute osseous findings demonstrated.   Electronically Signed   By: Camie Patience M.D.   On: 11/29/2013 02:01   Ct Head Wo Contrast  12/01/2013   CLINICAL DATA:  Headache and weakness. Status post fall out of bed. Skin tear at the mid forehead.  EXAM: CT HEAD WITHOUT CONTRAST  TECHNIQUE: Contiguous axial images were obtained from the base of the skull through the vertex without intravenous contrast.  COMPARISON:  CT of the head performed 11/30/2013  FINDINGS: There is no evidence of acute infarction, mass lesion, or intra- or extra-axial hemorrhage on CT.  The posterior fossa, including the cerebellum, brainstem and fourth ventricle, is within normal limits. The third and lateral ventricles, and basal ganglia are unremarkable in appearance. The cerebral hemispheres are symmetric in appearance, with normal gray-white differentiation. No mass effect or midline shift is seen.  There is no evidence of fracture; visualized osseous structures are unremarkable in appearance. Bilateral proptosis is noted. A small mucus retention cyst or polyp is noted at the left maxillary sinus. The remaining paranasal sinuses and mastoid air cells are well-aerated. Mild soft tissue swelling is noted overlying the left frontal  calvarium.  IMPRESSION: 1. No evidence of traumatic intracranial injury or fracture. 2. Mild soft tissue swelling overlying the left frontal calvarium. 3. Bilateral proptosis noted. 4. Small mucus retention cyst or polyp at the left maxillary sinus.   Electronically Signed   By: Garald Balding M.D.   On: 12/01/2013 04:58   Ct Head Wo Contrast  11/30/2013   CLINICAL DATA:  Pain post trauma ; altered mental status  EXAM: CT HEAD WITHOUT CONTRAST  TECHNIQUE: Contiguous axial images were obtained from the base of the skull through the vertex without intravenous contrast.  COMPARISON:  August 08, 2013  FINDINGS: The ventricles are normal in size and configuration. There is a rather minimal cavum septum pellucidum, an anatomic variant. There is no mass, hemorrhage, extra-axial fluid collection, or midline shift. [place] Cables and white compartments appear within normal limits. No apparent acute infarct. There is a small left frontal scalp hematoma. Bony calvarium appears intact. The mastoid air cells are clear. There is debris in the external auditory canal on the left.  IMPRESSION: No intracranial mass, hemorrhage, or extra-axial fluid. Gray-white compartments appear normal. Probable cerumen in the left external auditory canal.   Electronically Signed   By: Lowella Grip M.D.   On: 11/30/2013 14:34   Ct Cervical Spine Wo Contrast  12/02/2013   CLINICAL DATA:  Fall.  Evaluate for injury.  EXAM: CT CERVICAL, THORACIC, AND LUMBAR SPINE WITHOUT CONTRAST  TECHNIQUE: Multidetector CT imaging of the cervical, thoracic and lumbar spine was performed without intravenous contrast. Multiplanar CT image reconstructions were also generated.  COMPARISON:  CT cervical spine 08/08/2013 and CT chest 09/19/2012. CT pelvis 11/29/2013.  FINDINGS: CT CERVICAL SPINE FINDINGS  There is straightening of the normal cervical lordosis. Slight flattening of the C5 superior endplate is new from 14/97/0263. Vertebral body height is otherwise  maintained and unchanged. Advanced degenerative disc disease at C3-4 with marked loss of disc space height. Neural foramina appear widely patent. Visualized portions of the intracranial contents show no acute findings. Soft tissues are unremarkable.  CT THORACIC SPINE FINDINGS  Alignment is anatomic, including at the cervicothoracic junction. Vertebral  body height is maintained. Scattered endplate degenerative changes. Visualized portions of the ribs show no fracture. Visualized portions of the lungs shows scattered subsegmental atelectasis or scarring. No pleural fluid.  CT LUMBAR SPINE FINDINGS  Straightening of the normal lumbar lordosis. Approximately 12 mm anterolisthesis of L5 on S1 with severe degenerative disc disease and obliteration of the joint space, as on 11/29/2013. There are chronic bilateral L5 pars defects. Vertebral body height and alignment are otherwise maintained. Endplate degenerative changes and prominent Schmorl's nodes are seen of the thoracolumbar junction as well as at L1-2 and L2-3.  Visualized portions of the kidneys and retroperitoneum show no acute findings. Bladder is distended.  IMPRESSION: 1. C5 superior endplate appears slightly compressed when compared with 08/08/2013. Otherwise, no evidence of an acute fracture or subluxation. 2. Multilevel spondylosis. 3. Grade 2 anterolisthesis of L5 on S1, secondary to chronic bilateral pars defects, with advanced secondary degenerative disc disease.   Electronically Signed   By: Lorin Picket M.D.   On: 12/02/2013 17:46   Ct Cervical Spine W Contrast  12/04/2013   CLINICAL DATA:  Cervical myelopathy. Lower extremity weakness. Spasticity.  EXAM: CT MYELOGRAPHY CERVICAL AND THORACIC SPINE  TECHNIQUE: CT imaging of the cervical and thoracic spine was performed after intrathecal contrast administration. Multiplanar CT image reconstructions were also generated.  COMPARISON:  CT cervical and lumbar spine 12/02/2013  FINDINGS: CT myelogram  cervical spine:  Lumbar puncture and injection of contrast by Dr. Thornton Papas. The patient was not able to get into the magnet for MRI.  Mild retrolisthesis at C3-4 secondary to disc and facet degeneration. Negative for fracture or mass. No bony destruction.  C2-3:  Small central disc protrusion without cord deformity  C3-4: Marked disc degeneration with disc space narrowing endplate sclerosis and endplate cystic changes. Cervical spondylosis with diffuse uncinate spurring. Bilateral facet hypertrophy. There is moderate spinal stenosis with flattening of the cord. There is foraminal encroachment bilaterally secondary to spurring.  C4-5: Diffuse disc degeneration with a left paracentral disc protrusion causing mild flattening of the cord. Mild spinal stenosis. Neural foramina widely patent.  C5-6:  Small central disc protrusion with mild spinal stenosis noted  C6-7: Small central disc protrusion without significant spinal stenosis  C7-T1:  Negative  CT myelogram thoracic spine:  Negative for fracture or mass. No cord compression. Spinal cord morphology is normal and no mass lesion is identified in the cord.  Diffuse thoracic disc degeneration with disc space narrowing in the mid and lower thoracic spine. This is most severe at T12-L1 and L1-2 were there is marked disc space narrowing, endplate sclerosis and cystic change. There is mild osteophyte at T12-L1 and L1-2 without significant spinal stenosis or cord deformity. No disc protrusion identified.  IMPRESSION: Cervical spondylosis most severe at C3-4 where there is moderate spinal stenosis and foraminal encroachment bilaterally. There is a left paracentral disc protrusion at C4-5 with mild spinal stenosis. Mild spinal stenosis at C5-6 also noted.  Thoracic degenerative changes most prominent at T12-L1 and L1-2. No cord compression or significant thoracic spinal stenosis.   Electronically Signed   By: Franchot Gallo M.D.   On: 12/04/2013 14:19   Ct Thoracic Spine Wo  Contrast  12/02/2013   CLINICAL DATA:  Fall.  Evaluate for injury.  EXAM: CT CERVICAL, THORACIC, AND LUMBAR SPINE WITHOUT CONTRAST  TECHNIQUE: Multidetector CT imaging of the cervical, thoracic and lumbar spine was performed without intravenous contrast. Multiplanar CT image reconstructions were also generated.  COMPARISON:  CT cervical spine  08/08/2013 and CT chest 09/19/2012. CT pelvis 11/29/2013.  FINDINGS: CT CERVICAL SPINE FINDINGS  There is straightening of the normal cervical lordosis. Slight flattening of the C5 superior endplate is new from 24/26/8341. Vertebral body height is otherwise maintained and unchanged. Advanced degenerative disc disease at C3-4 with marked loss of disc space height. Neural foramina appear widely patent. Visualized portions of the intracranial contents show no acute findings. Soft tissues are unremarkable.  CT THORACIC SPINE FINDINGS  Alignment is anatomic, including at the cervicothoracic junction. Vertebral body height is maintained. Scattered endplate degenerative changes. Visualized portions of the ribs show no fracture. Visualized portions of the lungs shows scattered subsegmental atelectasis or scarring. No pleural fluid.  CT LUMBAR SPINE FINDINGS  Straightening of the normal lumbar lordosis. Approximately 12 mm anterolisthesis of L5 on S1 with severe degenerative disc disease and obliteration of the joint space, as on 11/29/2013. There are chronic bilateral L5 pars defects. Vertebral body height and alignment are otherwise maintained. Endplate degenerative changes and prominent Schmorl's nodes are seen of the thoracolumbar junction as well as at L1-2 and L2-3.  Visualized portions of the kidneys and retroperitoneum show no acute findings. Bladder is distended.  IMPRESSION: 1. C5 superior endplate appears slightly compressed when compared with 08/08/2013. Otherwise, no evidence of an acute fracture or subluxation. 2. Multilevel spondylosis. 3. Grade 2 anterolisthesis of L5  on S1, secondary to chronic bilateral pars defects, with advanced secondary degenerative disc disease.   Electronically Signed   By: Lorin Picket M.D.   On: 12/02/2013 17:46   Ct Thoracic Spine W Contrast  12/04/2013   CLINICAL DATA:  Cervical myelopathy. Lower extremity weakness. Spasticity.  EXAM: CT MYELOGRAPHY CERVICAL AND THORACIC SPINE  TECHNIQUE: CT imaging of the cervical and thoracic spine was performed after intrathecal contrast administration. Multiplanar CT image reconstructions were also generated.  COMPARISON:  CT cervical and lumbar spine 12/02/2013  FINDINGS: CT myelogram cervical spine:  Lumbar puncture and injection of contrast by Dr. Thornton Papas. The patient was not able to get into the magnet for MRI.  Mild retrolisthesis at C3-4 secondary to disc and facet degeneration. Negative for fracture or mass. No bony destruction.  C2-3:  Small central disc protrusion without cord deformity  C3-4: Marked disc degeneration with disc space narrowing endplate sclerosis and endplate cystic changes. Cervical spondylosis with diffuse uncinate spurring. Bilateral facet hypertrophy. There is moderate spinal stenosis with flattening of the cord. There is foraminal encroachment bilaterally secondary to spurring.  C4-5: Diffuse disc degeneration with a left paracentral disc protrusion causing mild flattening of the cord. Mild spinal stenosis. Neural foramina widely patent.  C5-6:  Small central disc protrusion with mild spinal stenosis noted  C6-7: Small central disc protrusion without significant spinal stenosis  C7-T1:  Negative  CT myelogram thoracic spine:  Negative for fracture or mass. No cord compression. Spinal cord morphology is normal and no mass lesion is identified in the cord.  Diffuse thoracic disc degeneration with disc space narrowing in the mid and lower thoracic spine. This is most severe at T12-L1 and L1-2 were there is marked disc space narrowing, endplate sclerosis and cystic change. There is  mild osteophyte at T12-L1 and L1-2 without significant spinal stenosis or cord deformity. No disc protrusion identified.  IMPRESSION: Cervical spondylosis most severe at C3-4 where there is moderate spinal stenosis and foraminal encroachment bilaterally. There is a left paracentral disc protrusion at C4-5 with mild spinal stenosis. Mild spinal stenosis at C5-6 also noted.  Thoracic degenerative changes  most prominent at T12-L1 and L1-2. No cord compression or significant thoracic spinal stenosis.   Electronically Signed   By: Franchot Gallo M.D.   On: 12/04/2013 14:19   Ct Lumbar Spine Wo Contrast  12/02/2013   CLINICAL DATA:  Fall.  Evaluate for injury.  EXAM: CT CERVICAL, THORACIC, AND LUMBAR SPINE WITHOUT CONTRAST  TECHNIQUE: Multidetector CT imaging of the cervical, thoracic and lumbar spine was performed without intravenous contrast. Multiplanar CT image reconstructions were also generated.  COMPARISON:  CT cervical spine 08/08/2013 and CT chest 09/19/2012. CT pelvis 11/29/2013.  FINDINGS: CT CERVICAL SPINE FINDINGS  There is straightening of the normal cervical lordosis. Slight flattening of the C5 superior endplate is new from 84/13/2440. Vertebral body height is otherwise maintained and unchanged. Advanced degenerative disc disease at C3-4 with marked loss of disc space height. Neural foramina appear widely patent. Visualized portions of the intracranial contents show no acute findings. Soft tissues are unremarkable.  CT THORACIC SPINE FINDINGS  Alignment is anatomic, including at the cervicothoracic junction. Vertebral body height is maintained. Scattered endplate degenerative changes. Visualized portions of the ribs show no fracture. Visualized portions of the lungs shows scattered subsegmental atelectasis or scarring. No pleural fluid.  CT LUMBAR SPINE FINDINGS  Straightening of the normal lumbar lordosis. Approximately 12 mm anterolisthesis of L5 on S1 with severe degenerative disc disease and  obliteration of the joint space, as on 11/29/2013. There are chronic bilateral L5 pars defects. Vertebral body height and alignment are otherwise maintained. Endplate degenerative changes and prominent Schmorl's nodes are seen of the thoracolumbar junction as well as at L1-2 and L2-3.  Visualized portions of the kidneys and retroperitoneum show no acute findings. Bladder is distended.  IMPRESSION: 1. C5 superior endplate appears slightly compressed when compared with 08/08/2013. Otherwise, no evidence of an acute fracture or subluxation. 2. Multilevel spondylosis. 3. Grade 2 anterolisthesis of L5 on S1, secondary to chronic bilateral pars defects, with advanced secondary degenerative disc disease.   Electronically Signed   By: Lorin Picket M.D.   On: 12/02/2013 17:46   Ct Pelvis Wo Contrast  11/29/2013   CLINICAL DATA:  Left hip pain after falling from bed. Mentally challenged.  EXAM: CT PELVIS WITHOUT CONTRAST  TECHNIQUE: Multidetector CT imaging of the pelvis was performed following the standard protocol without intravenous contrast.  COMPARISON:  Radiographs 12/01/2003 and 11/29/2013.  FINDINGS: There are apparent bilateral hip contractures. The utilized thin section technique is suboptimal for the patient's body habitus and these contractures, resulting in decreased signal to noise.  There is no evidence of acute fracture or dislocation. There is no evidence of femoral head avascular necrosis. 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  There is a grade 2 anterolisthesis at L5-S1 secondary to bilateral L5 pars defects. There is severe degenerative disc disease at L5-S1 with endplate sclerosis and vacuum phenomenon. There is severe biforaminal stenosis and probable bilateral L5 nerve root encroachment.  No periarticular hematoma is identified. Central prostatic calcifications and moderate bladder distention are noted. The visualized  internal pelvic contents are otherwise unremarkable.  IMPRESSION: 1. No demonstrated acute osseous findings in the pelvis. 2. 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 3. Bilateral L5 pars defects with resulting grade 2 anterolisthesis and severe biforaminal stenosis at L5-S1. There is probable bilateral L5 nerve root encroachment. 4. Central prostatic calcifications.   Electronically Signed   By: Modesta Messing.D.  On: 11/29/2013 02:14   Mr Lumbar Spine W Wo Contrast  12/09/2013   CLINICAL DATA:  48 year old male with fall in July, history of lumbar spondylosis, lower extremity weakness and spasticity. Query cauda equina compression. Initial encounter.  EXAM: MRI LUMBAR SPINE WITHOUT AND WITH CONTRAST  TECHNIQUE: Multiplanar and multiecho pulse sequences of the lumbar spine were obtained without and with intravenous contrast.  CONTRAST:  80m MULTIHANCE GADOBENATE DIMEGLUMINE 529 MG/ML IV SOLN  [facility]. Lumbar spine CT 12/02/2013.  FINDINGS: Stable visualized lower thoracic spine and lumbar spine vertebral height and alignment from the recent CTs. Chronic anterolisthesis at L5-S1 with chronic L5 pars fractures, better demonstrated by CT. Endplate marrow signal changes at L5-S1, including suggestion of some marrow edema (series 500, image 8), but largely chronic. Similar marrow signal changes at the L1-L2 endplates. Visible sacral ala intact.  Stable appearance of the lower thoracic spinal cord and conus, tip of the conus at L2. No lower spinal cord or conus signal abnormality. See T12-L1 and L1-L2 findings below.  Distended bladder. Subcutaneous edema in the posterior paraspinal soft tissues. Paraspinal musculature within normal limits.  T10-T11:  Stable from recent myelogram, no spinal stenosis.  T11-T12:  Stable from recent myelogram, no spinal stenosis.  T12-L1: Disc space loss. Mild circumferential disc  osteophyte complex. Borderline to mild spinal stenosis. No cord mass effect or signal abnormality.  L1-L2: Severe disc space loss. Vacuum disc phenomena. Degenerative endplate changes. Circumferential disc osteophyte complex. Borderline to mild spinal stenosis, no cord mass effect or signal abnormality.  L2-L3: Small right paracentral disc protrusion. No spinal or lateral recess stenosis. Mild left greater than right L2 foraminal stenosis.  L3-L4:  Mild epidural lipomatosis.  L4-L5: Broad-based left paracentral disc protrusion. Epidural lipomatosis. Superimposed moderate facet hypertrophy. Up to moderate spinal stenosis and severe left lateral recess stenosis (series 900, image 26). Moderate left L4 foraminal stenosis.  L5-S1: Anterolisthesis. Advanced disc and endplate degeneration. Broad-based pseudo disc bulge. Moderate to severe facet hypertrophy. Chronic L5 pars fractures better depicted by CT. Epidural lipomatosis. Severe multifactorial spinal stenosis (series 900, image 28). Severe bilateral L5 foraminal stenosis.  IMPRESSION: 1. Multifactorial moderate to severe degenerative spinal stenosis at L4-L5 and L5-S1. Severe left lateral recess stenosis at the descending left L5 nerve root levels. Moderate left L4 and severe bilateral L5 foraminal stenosis. 2. Chronic L5-S1 spondylolisthesis and spondylolysis. Mild marrow edema at the L5-S1 endplates, and also at L1-L2, appears to be degenerative in nature rather than posttraumatic. 3. Stable borderline to mild degenerative spinal stenosis at the level of the conus medullaris, T12-L1 and L1-L2. No lower thoracic spinal cord or conus mass effect or signal abnormality. 4. Distended bladder.   Electronically Signed   By: LLars PinksM.D.   On: 12/09/2013 13:38   Dg Myelography Lumbar Inj Multi Region  12/05/2013   CLINICAL DATA:  BILATERAL lower extremity weakness, hyperreflexia with up going toes, question cord compression, cervical spinal stenosis on noncontrast CT,  inability to have MRI  FLUOROSCOPY TIME:  dictate in minutes and seconds  PROCEDURE: LUMBAR PUNCTURE FOR CERVICAL AND THORACIC MYELOGRAM  After thorough discussion of risks and benefits of the procedure including bleeding, infection, injury to nerves, blood vessels, adjacent structures as well as headache and CSF leak, written and oral informed consent was obtained. Timeout protocol followed.  Due to patient inability to extend hips and lie prone, patient was placed in a RIGHT lower decubitus position. Patient prepped  and draped in usual sterile fashion. Local anesthesia was provided with 3 mL of 1% lidocaine without epinephrine. Lumbar puncture was performed at L3-L4 using a 3 1/2 inch 22-gauge spinal needle via a midline approach. Using a single pass through the dura, the needle was placed within the thecal sac, with return of clear colorless CSF. Opening pressure 18 cm H2O. 8 mL of CSF was obtained for requested laboratory analysis. 10 mL of Omnipaque-300 was injected into the thecal sac, with normal opacification of the nerve roots and cauda equina consistent with free flow within the subarachnoid space. The patient was then moved to the Trendelenburg position and contrast flowed into the thoracic and cervical spine.  I personally performed the lumbar puncture and administered the intrathecal contrast.  TECHNIQUE: Contiguous axial images were obtained through the Cervical and Thoracic spine after the intrathecal infusion of infusion. Coronal and sagittal reconstructions were obtained of the axial image sets. Cervical and thoracic CT myelography images are reported separately.  FINDINGS: Free flow of contrast was identified from the the injection site at the lumbar spine to the cervical spine.  Indentation of the ventral aspect of the thecal sac is identified at the L1-L2 level by bulging disc and endplate spur formation, please refer to CT.  Grade 2 anterolisthesis L5-S1.  No immediate complications.   IMPRESSION: Lumbar puncture and contrast injection for cervical and thoracic CT myelography as above.   Electronically Signed   By: Lavonia Dana M.D.   On: 12/05/2013 08:19    Microbiology: No results found for this or any previous visit (from the past 240 hour(s)).   Labs: Basic Metabolic Panel:  Recent Labs Lab 12/07/13 0455 12/08/13 0347 12/09/13 0335 12/10/13 0621 12/11/13 0502  NA 145 147 146 149* 149*  K 5.0 4.6 4.5 5.1 4.9  CL 108 108 109 110 109  CO2 26 27 27 28 28   GLUCOSE 167* 159* 162* 142* 180*  BUN 32* 31* 31* 33* 33*  CREATININE 1.12 1.12 1.15 1.24 1.08  CALCIUM 8.8 8.7 8.8 8.5 8.6   Liver Function Tests: No results found for this basename: AST, ALT, ALKPHOS, BILITOT, PROT, ALBUMIN,  in the last 168 hours No results found for this basename: LIPASE, AMYLASE,  in the last 168 hours No results found for this basename: AMMONIA,  in the last 168 hours CBC:  Recent Labs Lab 12/07/13 0455  WBC 7.4  HGB 11.1*  HCT 34.8*  MCV 100.3*  PLT 248   Cardiac Enzymes: No results found for this basename: CKTOTAL, CKMB, CKMBINDEX, TROPONINI,  in the last 168 hours BNP: BNP (last 3 results) No results found for this basename: PROBNP,  in the last 8760 hours CBG:  Recent Labs Lab 12/08/13 1619  GLUCAP 148*       Signed:  FELIZ ORTIZ, ABRAHAM  [facility] 12/11/2013, 9:44 AM

## 2013-12-10 NOTE — Progress Notes (Signed)
UR complete.  Ryann Leavitt RN, MSN 

## 2013-12-11 LAB — BASIC METABOLIC PANEL
ANION GAP: 12 (ref 5–15)
BUN: 33 mg/dL — ABNORMAL HIGH (ref 6–23)
CALCIUM: 8.6 mg/dL (ref 8.4–10.5)
CO2: 28 mEq/L (ref 19–32)
CREATININE: 1.08 mg/dL (ref 0.50–1.35)
Chloride: 109 mEq/L (ref 96–112)
GFR calc non Af Amer: 80 mL/min — ABNORMAL LOW (ref >90)
Glucose, Bld: 180 mg/dL — ABNORMAL HIGH (ref 70–99)
Potassium: 4.9 mEq/L (ref 3.7–5.3)
Sodium: 149 mEq/L — ABNORMAL HIGH (ref 137–147)

## 2013-12-11 NOTE — Progress Notes (Signed)
Physical Therapy Treatment Patient Details Name: CHANCELER PULLIN MRN: [id number] DOB: 14-May-1965 Today's Date: 12/11/2013    History of Present Illness  48 y.o. male admitted transfer from [facility] on 12/02/13 with multiple falls one from bed level and two from w/c seated position. Pt has been w/c dependent due to LE weakness. Prior to this he had been ambulating with a walker. Pt from [facility] assisted living facility. CT reveals probable fracture in low thoracic and upper lumbar spine and anterolisthesis of L5 on S1; imaging difficulty due to flexed position.  Pt possibly being sent for myelogram of spine per neurology. PMH developmental disability, obstructive sleep apnea, COPD,  tobacco abuse, acute respiratory failure with hypoxia secondary to obstructive sleep apnea and opiate-induced altered mentation.  Pt underwent MRI under anesthesia on 12/09/12 and MRI revealed multifactorial moderate to severe degenerative spinal stenosis at L4-L5 and L5-S1. Severe left lateral recess stenosis at the descending left L5 nerve root levels. Moderate left L4 and severe bilateral L5 foraminal stenosis.  Neurosurgery consulted and is treating issues conservatively (non-surgically).      PT Comments    Pt tolerated time OOB yesterday with no reports of increased pain afterwards.  He was agreeable to work with PT again today, however he is having increased tone and spasm throughout all extremities which improved mildly with mobility today.  We continue to attempt EOB standing with two person.  Would be worth a try to do Sara Plus assisted stander in the future if we can get his foot placement good enough EOB to try it.  PT will continue to follow acutely.    Follow Up Recommendations  SNF     Equipment Recommendations  Wheelchair (measurements PT);Wheelchair cushion (measurements PT);Hospital bed;Other (comment) (hoyer lift)    Recommendations for Other Services   NA     Precautions / Restrictions  Precautions Precautions: Fall Precaution Comments: h/o mult falls at  ALF    Mobility  Bed Mobility Overal bed mobility: Needs Assistance;+2 for physical assistance Bed Mobility: Rolling;Sidelying to Sit Rolling: +2 for physical assistance;Mod assist Sidelying to sit: +2 for physical assistance;Mod assist       General bed mobility comments: Pt actually trying to initiate and help more with all aspects of bed mobility today.  He was reaching and trying to maneuver his trynk and legs more for [place] today despite increased tone throughout   Transfers Overall transfer level: Needs assistance Equipment used: None Transfers: Sit to/from Stand Sit to Stand: +2 physical assistance;Max assist         General transfer comment: Two person max assist with feet blocked, using pad to support hips and encourage hip extension.  Support needed at trunk and prevent feet from sliding anteriorly.  Pt with increased extensor tone in legs today compared to yesterday and his left leg is worse than his right leg (at times left knee would extend and needed PROM to get it back on the floor for a second attempt at standing).  Tone in left leg 3/5 on modified ashworth scale.          Balance Overall balance assessment: Needs assistance Sitting-balance support: Feet supported;Bilateral upper extremity supported Sitting balance-Leahy Scale: Fair Sitting balance - Comments: Pt had periods of time today where he could be close supervision for 45 sec to a min EOB.  Postural control: Other (comment);Right lateral lean (anterior lean when attempting to get feet flat on the ground) Standing balance support: Bilateral upper extremity supported Standing  balance-Leahy Scale: Zero Standing balance comment: needs two person total assist to even attempt standing.                     Cognition Arousal/Alertness: Awake/alert Behavior During Therapy: Flat affect Overall Cognitive Status: History of cognitive  impairments - at baseline                         General Comments General comments (skin integrity, edema, etc.): Incontinent of urine again today, RN tech made aware      Pertinent Vitals/Pain See vitals flow sheet.            PT Goals (current goals can now be found in the care plan section) Acute Rehab PT Goals Patient Stated Goal: none stated Progress towards PT goals: Progressing toward goals    Frequency  Min 3X/week    PT Plan Current plan remains appropriate       End of Session Equipment Utilized During Treatment: Gait belt Activity Tolerance: Patient tolerated treatment well Patient left: in chair;with call bell/phone within reach;with chair alarm set     Time: [id number] PT Time Calculation (min): 34 min  Charges:  $Therapeutic Activity: 23-37 mins                     Rylie Limburg B. Tianna Baus, PT, DPT [phone]   12/11/2013, 12:59 PM

## 2013-12-11 NOTE — Clinical Social Work Note (Signed)
[facility] CSW contacted pt's sister, Efraim KaufmannMelissa, regarding discharge. Discharge packet is complete and placed on pt's shadow chart. Transportation to be arranged via EMS ([facility]).  RN to please call [facility] at [phone].  Marcelline DeistEmily Britteney Ayotte, MSW, [facility]WA Licensed Clinical Social Worker [phone] [id number] and [phone] [id number] [phone]

## 2013-12-11 NOTE — Progress Notes (Signed)
[facility]

## 2013-12-12 ENCOUNTER — Encounter (HOSPITAL_COMMUNITY): Payer: Self-pay | Admitting: Radiology

## 2013-12-20 ENCOUNTER — Encounter (HOSPITAL_COMMUNITY): Payer: Self-pay | Admitting: *Deleted

## 2013-12-20 ENCOUNTER — Inpatient Hospital Stay (HOSPITAL_COMMUNITY): Payer: Medicaid Other

## 2013-12-20 ENCOUNTER — Inpatient Hospital Stay (HOSPITAL_COMMUNITY)
Admission: EM | Admit: 2013-12-20 | Discharge: 2014-01-07 | DRG: 870 | Disposition: E | Payer: Medicaid Other | Source: Other Acute Inpatient Hospital | Attending: Internal Medicine | Admitting: Internal Medicine

## 2013-12-20 DIAGNOSIS — M79609 Pain in unspecified limb: Secondary | ICD-10-CM

## 2013-12-20 DIAGNOSIS — E46 Unspecified protein-calorie malnutrition: Secondary | ICD-10-CM | POA: Diagnosis not present

## 2013-12-20 DIAGNOSIS — E538 Deficiency of other specified B group vitamins: Secondary | ICD-10-CM | POA: Diagnosis present

## 2013-12-20 DIAGNOSIS — Z6832 Body mass index (BMI) 32.0-32.9, adult: Secondary | ICD-10-CM | POA: Diagnosis not present

## 2013-12-20 DIAGNOSIS — I359 Nonrheumatic aortic valve disorder, unspecified: Secondary | ICD-10-CM

## 2013-12-20 DIAGNOSIS — Z515 Encounter for palliative care: Secondary | ICD-10-CM | POA: Diagnosis not present

## 2013-12-20 DIAGNOSIS — Z66 Do not resuscitate: Secondary | ICD-10-CM | POA: Diagnosis not present

## 2013-12-20 DIAGNOSIS — R5381 Other malaise: Secondary | ICD-10-CM | POA: Diagnosis present

## 2013-12-20 DIAGNOSIS — R569 Unspecified convulsions: Secondary | ICD-10-CM | POA: Diagnosis not present

## 2013-12-20 DIAGNOSIS — R1311 Dysphagia, oral phase: Secondary | ICD-10-CM | POA: Diagnosis not present

## 2013-12-20 DIAGNOSIS — E870 Hyperosmolality and hypernatremia: Secondary | ICD-10-CM | POA: Diagnosis not present

## 2013-12-20 DIAGNOSIS — G8220 Paraplegia, unspecified: Secondary | ICD-10-CM | POA: Diagnosis not present

## 2013-12-20 DIAGNOSIS — G4730 Sleep apnea, unspecified: Secondary | ICD-10-CM | POA: Diagnosis present

## 2013-12-20 DIAGNOSIS — M4716 Other spondylosis with myelopathy, lumbar region: Secondary | ICD-10-CM

## 2013-12-20 DIAGNOSIS — M4712 Other spondylosis with myelopathy, cervical region: Secondary | ICD-10-CM | POA: Diagnosis present

## 2013-12-20 DIAGNOSIS — I498 Other specified cardiac arrhythmias: Secondary | ICD-10-CM | POA: Diagnosis present

## 2013-12-20 DIAGNOSIS — E872 Acidosis, unspecified: Secondary | ICD-10-CM | POA: Diagnosis present

## 2013-12-20 DIAGNOSIS — R34 Anuria and oliguria: Secondary | ICD-10-CM | POA: Diagnosis not present

## 2013-12-20 DIAGNOSIS — Z9981 Dependence on supplemental oxygen: Secondary | ICD-10-CM

## 2013-12-20 DIAGNOSIS — M79604 Pain in right leg: Secondary | ICD-10-CM

## 2013-12-20 DIAGNOSIS — I959 Hypotension, unspecified: Secondary | ICD-10-CM

## 2013-12-20 DIAGNOSIS — N12 Tubulo-interstitial nephritis, not specified as acute or chronic: Secondary | ICD-10-CM | POA: Diagnosis present

## 2013-12-20 DIAGNOSIS — Z791 Long term (current) use of non-steroidal anti-inflammatories (NSAID): Secondary | ICD-10-CM

## 2013-12-20 DIAGNOSIS — Z833 Family history of diabetes mellitus: Secondary | ICD-10-CM

## 2013-12-20 DIAGNOSIS — G92 Toxic encephalopathy: Secondary | ICD-10-CM | POA: Diagnosis present

## 2013-12-20 DIAGNOSIS — J441 Chronic obstructive pulmonary disease with (acute) exacerbation: Secondary | ICD-10-CM

## 2013-12-20 DIAGNOSIS — G929 Unspecified toxic encephalopathy: Secondary | ICD-10-CM | POA: Diagnosis present

## 2013-12-20 DIAGNOSIS — R7309 Other abnormal glucose: Secondary | ICD-10-CM | POA: Diagnosis present

## 2013-12-20 DIAGNOSIS — G4733 Obstructive sleep apnea (adult) (pediatric): Secondary | ICD-10-CM | POA: Diagnosis present

## 2013-12-20 DIAGNOSIS — I10 Essential (primary) hypertension: Secondary | ICD-10-CM | POA: Diagnosis present

## 2013-12-20 DIAGNOSIS — R4182 Altered mental status, unspecified: Secondary | ICD-10-CM | POA: Diagnosis present

## 2013-12-20 DIAGNOSIS — R252 Cramp and spasm: Secondary | ICD-10-CM

## 2013-12-20 DIAGNOSIS — Z9181 History of falling: Secondary | ICD-10-CM | POA: Diagnosis not present

## 2013-12-20 DIAGNOSIS — E876 Hypokalemia: Secondary | ICD-10-CM | POA: Diagnosis not present

## 2013-12-20 DIAGNOSIS — R6520 Severe sepsis without septic shock: Secondary | ICD-10-CM | POA: Diagnosis present

## 2013-12-20 DIAGNOSIS — N170 Acute kidney failure with tubular necrosis: Secondary | ICD-10-CM | POA: Diagnosis present

## 2013-12-20 DIAGNOSIS — F17200 Nicotine dependence, unspecified, uncomplicated: Secondary | ICD-10-CM | POA: Diagnosis present

## 2013-12-20 DIAGNOSIS — R001 Bradycardia, unspecified: Secondary | ICD-10-CM

## 2013-12-20 DIAGNOSIS — J9601 Acute respiratory failure with hypoxia: Secondary | ICD-10-CM

## 2013-12-20 DIAGNOSIS — D649 Anemia, unspecified: Secondary | ICD-10-CM | POA: Diagnosis present

## 2013-12-20 DIAGNOSIS — M62838 Other muscle spasm: Secondary | ICD-10-CM

## 2013-12-20 DIAGNOSIS — R6521 Severe sepsis with septic shock: Secondary | ICD-10-CM

## 2013-12-20 DIAGNOSIS — E869 Volume depletion, unspecified: Secondary | ICD-10-CM | POA: Diagnosis not present

## 2013-12-20 DIAGNOSIS — R1313 Dysphagia, pharyngeal phase: Secondary | ICD-10-CM | POA: Diagnosis not present

## 2013-12-20 DIAGNOSIS — J9602 Acute respiratory failure with hypercapnia: Secondary | ICD-10-CM

## 2013-12-20 DIAGNOSIS — A419 Sepsis, unspecified organism: Principal | ICD-10-CM | POA: Diagnosis present

## 2013-12-20 DIAGNOSIS — G589 Mononeuropathy, unspecified: Secondary | ICD-10-CM | POA: Diagnosis present

## 2013-12-20 DIAGNOSIS — Z801 Family history of malignant neoplasm of trachea, bronchus and lung: Secondary | ICD-10-CM | POA: Diagnosis not present

## 2013-12-20 DIAGNOSIS — R6250 Unspecified lack of expected normal physiological development in childhood: Secondary | ICD-10-CM | POA: Diagnosis present

## 2013-12-20 DIAGNOSIS — G9340 Encephalopathy, unspecified: Secondary | ICD-10-CM | POA: Diagnosis present

## 2013-12-20 DIAGNOSIS — D696 Thrombocytopenia, unspecified: Secondary | ICD-10-CM | POA: Diagnosis present

## 2013-12-20 DIAGNOSIS — N179 Acute kidney failure, unspecified: Secondary | ICD-10-CM

## 2013-12-20 DIAGNOSIS — R579 Shock, unspecified: Secondary | ICD-10-CM

## 2013-12-20 DIAGNOSIS — D539 Nutritional anemia, unspecified: Secondary | ICD-10-CM | POA: Diagnosis present

## 2013-12-20 DIAGNOSIS — N319 Neuromuscular dysfunction of bladder, unspecified: Secondary | ICD-10-CM | POA: Diagnosis present

## 2013-12-20 DIAGNOSIS — J9600 Acute respiratory failure, unspecified whether with hypoxia or hypercapnia: Secondary | ICD-10-CM | POA: Diagnosis present

## 2013-12-20 DIAGNOSIS — M79605 Pain in left leg: Secondary | ICD-10-CM

## 2013-12-20 DIAGNOSIS — F89 Unspecified disorder of psychological development: Secondary | ICD-10-CM | POA: Diagnosis present

## 2013-12-20 DIAGNOSIS — J449 Chronic obstructive pulmonary disease, unspecified: Secondary | ICD-10-CM | POA: Diagnosis present

## 2013-12-20 DIAGNOSIS — J4489 Other specified chronic obstructive pulmonary disease: Secondary | ICD-10-CM | POA: Diagnosis present

## 2013-12-20 LAB — POCT ACTIVATED CLOTTING TIME
ACTIVATED CLOTTING TIME: 185 s
ACTIVATED CLOTTING TIME: 168 s
ACTIVATED CLOTTING TIME: 180 s
ACTIVATED CLOTTING TIME: 197 s
Activated Clotting Time: 174 seconds
Activated Clotting Time: 174 seconds
Activated Clotting Time: 180 seconds
Activated Clotting Time: 214 seconds

## 2013-12-20 LAB — GLUCOSE, CAPILLARY
GLUCOSE-CAPILLARY: 170 mg/dL — AB (ref 70–99)
GLUCOSE-CAPILLARY: 68 mg/dL — AB (ref 70–99)
Glucose-Capillary: 73 mg/dL (ref 70–99)
Glucose-Capillary: 71 mg/dL (ref 70–99)

## 2013-12-20 LAB — POCT I-STAT 3, ART BLOOD GAS (G3+)
Acid-base deficit: 1 mmol/L (ref 0.0–2.0)
Bicarbonate: 24.7 mEq/L — ABNORMAL HIGH (ref 20.0–24.0)
O2 Saturation: 100 %
PCO2 ART: 45 mmHg (ref 35.0–45.0)
PH ART: 7.345 — AB (ref 7.350–7.450)
PO2 ART: 461 mmHg — AB (ref 80.0–100.0)
Patient temperature: 97.5
TCO2: 26 mmol/L (ref 0–100)

## 2013-12-20 LAB — TSH: TSH: 1.19 u[IU]/mL (ref 0.350–4.500)

## 2013-12-20 LAB — CORTISOL: Cortisol, Plasma: 13.5 ug/dL

## 2013-12-20 LAB — COMPREHENSIVE METABOLIC PANEL
ALT: 22 U/L (ref 0–53)
ANION GAP: 15 (ref 5–15)
AST: 19 U/L (ref 0–37)
Albumin: 1.5 g/dL — ABNORMAL LOW (ref 3.5–5.2)
Alkaline Phosphatase: 86 U/L (ref 39–117)
BUN: 86 mg/dL — AB (ref 6–23)
CALCIUM: 6.2 mg/dL — AB (ref 8.4–10.5)
CHLORIDE: 110 meq/L (ref 96–112)
CO2: 21 meq/L (ref 19–32)
CREATININE: 7.64 mg/dL — AB (ref 0.50–1.35)
GFR calc non Af Amer: 8 mL/min — ABNORMAL LOW (ref >90)
GFR, EST AFRICAN AMERICAN: 9 mL/min — AB (ref >90)
Glucose, Bld: 140 mg/dL — ABNORMAL HIGH (ref 70–99)
Potassium: 4.5 mEq/L (ref 3.7–5.3)
Sodium: 146 mEq/L (ref 137–147)
Total Protein: 4.5 g/dL — ABNORMAL LOW (ref 6.0–8.3)

## 2013-12-20 LAB — TYPE AND SCREEN
ABO/RH(D): A POS
Antibody Screen: NEGATIVE

## 2013-12-20 LAB — RENAL FUNCTION PANEL
ANION GAP: 18 — AB (ref 5–15)
Albumin: 1.9 g/dL — ABNORMAL LOW (ref 3.5–5.2)
BUN: 81 mg/dL — AB (ref 6–23)
CHLORIDE: 111 meq/L (ref 96–112)
CO2: 18 mEq/L — ABNORMAL LOW (ref 19–32)
Calcium: 6.7 mg/dL — ABNORMAL LOW (ref 8.4–10.5)
Creatinine, Ser: 7.58 mg/dL — ABNORMAL HIGH (ref 0.50–1.35)
GFR calc non Af Amer: 8 mL/min — ABNORMAL LOW (ref >90)
GFR, EST AFRICAN AMERICAN: 9 mL/min — AB (ref >90)
GLUCOSE: 80 mg/dL (ref 70–99)
POTASSIUM: 5.1 meq/L (ref 3.7–5.3)
Phosphorus: 4.9 mg/dL — ABNORMAL HIGH (ref 2.3–4.6)
SODIUM: 147 meq/L (ref 137–147)

## 2013-12-20 LAB — CBC WITH DIFFERENTIAL/PLATELET
Basophils Absolute: 0 10*3/uL (ref 0.0–0.1)
Basophils Relative: 0 % (ref 0–1)
Eosinophils Absolute: 0 10*3/uL (ref 0.0–0.7)
Eosinophils Relative: 0 % (ref 0–5)
HEMATOCRIT: 24.5 % — AB (ref 39.0–52.0)
Hemoglobin: 8.2 g/dL — ABNORMAL LOW (ref 13.0–17.0)
LYMPHS PCT: 12 % (ref 12–46)
Lymphs Abs: 0.7 10*3/uL (ref 0.7–4.0)
MCH: 32.4 pg (ref 26.0–34.0)
MCHC: 33.5 g/dL (ref 30.0–36.0)
MCV: 96.8 fL (ref 78.0–100.0)
MONO ABS: 0.4 10*3/uL (ref 0.1–1.0)
Monocytes Relative: 7 % (ref 3–12)
NEUTROS ABS: 5.3 10*3/uL (ref 1.7–7.7)
Neutrophils Relative %: 81 % — ABNORMAL HIGH (ref 43–77)
Platelets: 106 10*3/uL — ABNORMAL LOW (ref 150–400)
RBC: 2.53 MIL/uL — ABNORMAL LOW (ref 4.22–5.81)
RDW: 15.6 % — ABNORMAL HIGH (ref 11.5–15.5)
WBC: 6.5 10*3/uL (ref 4.0–10.5)

## 2013-12-20 LAB — URINE MICROSCOPIC-ADD ON

## 2013-12-20 LAB — URINALYSIS, ROUTINE W REFLEX MICROSCOPIC
BILIRUBIN URINE: NEGATIVE
Glucose, UA: NEGATIVE mg/dL
Ketones, ur: 15 mg/dL — AB
Nitrite: POSITIVE — AB
Specific Gravity, Urine: 1.013 (ref 1.005–1.030)
Urobilinogen, UA: 0.2 mg/dL (ref 0.0–1.0)
pH: 6.5 (ref 5.0–8.0)

## 2013-12-20 LAB — PROTIME-INR
INR: 1.36 (ref 0.00–1.49)
PROTHROMBIN TIME: 16.8 s — AB (ref 11.6–15.2)

## 2013-12-20 LAB — ABO/RH: ABO/RH(D): A POS

## 2013-12-20 LAB — MRSA PCR SCREENING: MRSA by PCR: NEGATIVE

## 2013-12-20 LAB — CARBOXYHEMOGLOBIN
Carboxyhemoglobin: 0.8 % (ref 0.5–1.5)
Methemoglobin: 0.9 % (ref 0.0–1.5)
O2 SAT: 76.2 %
TOTAL HEMOGLOBIN: 10.2 g/dL — AB (ref 13.5–18.0)

## 2013-12-20 LAB — FIBRINOGEN: Fibrinogen: >800 mg/dL — ABNORMAL HIGH (ref 204–475)

## 2013-12-20 LAB — SODIUM, URINE, RANDOM: SODIUM UR: 76 meq/L

## 2013-12-20 LAB — CREATININE, URINE, RANDOM: Creatinine, Urine: 52.14 mg/dL

## 2013-12-20 LAB — APTT: aPTT: 35 seconds (ref 24–37)

## 2013-12-20 LAB — CLOSTRIDIUM DIFFICILE BY PCR: CDIFFPCR: NEGATIVE

## 2013-12-20 LAB — TROPONIN I

## 2013-12-20 LAB — LACTIC ACID, PLASMA: Lactic Acid, Venous: 0.8 mmol/L (ref 0.5–2.2)

## 2013-12-20 MED ORDER — SODIUM CHLORIDE 0.9 % IJ SOLN
250.0000 [IU]/h | INTRAMUSCULAR | Status: DC
  Administered 2013-12-20: 300 [IU]/h via INTRAVENOUS_CENTRAL
  Administered 2013-12-21: 1400 [IU]/h via INTRAVENOUS_CENTRAL
  Administered 2013-12-21: 1050 [IU]/h via INTRAVENOUS_CENTRAL
  Administered 2013-12-22: 1850 [IU]/h via INTRAVENOUS_CENTRAL
  Administered 2013-12-22: 1400 [IU]/h via INTRAVENOUS_CENTRAL
  Administered 2013-12-23 (×2): 1850 [IU]/h via INTRAVENOUS_CENTRAL
  Filled 2013-12-20 (×10): qty 2

## 2013-12-20 MED ORDER — FENTANYL BOLUS VIA INFUSION
50.0000 ug | INTRAVENOUS | Status: DC | PRN
  Administered 2013-12-20 (×2): 100 ug via INTRAVENOUS
  Administered 2013-12-20: 50 ug via INTRAVENOUS
  Administered 2013-12-21: 100 ug via INTRAVENOUS
  Filled 2013-12-20: qty 100

## 2013-12-20 MED ORDER — HEPARIN (PORCINE) 2000 UNITS/L FOR CRRT
INTRAVENOUS_CENTRAL | Status: DC | PRN
  Administered 2013-12-23 (×2): via INTRAVENOUS_CENTRAL
  Filled 2013-12-20: qty 1000

## 2013-12-20 MED ORDER — HEPARIN SODIUM (PORCINE) 5000 UNIT/ML IJ SOLN
5000.0000 [IU] | Freq: Three times a day (TID) | INTRAMUSCULAR | Status: DC
  Administered 2013-12-20 – 2013-12-22 (×6): 5000 [IU] via SUBCUTANEOUS
  Filled 2013-12-20 (×10): qty 1

## 2013-12-20 MED ORDER — CHLORHEXIDINE GLUCONATE 0.12 % MT SOLN
15.0000 mL | Freq: Two times a day (BID) | OROMUCOSAL | Status: DC
  Administered 2013-12-20 – 2014-01-04 (×28): 15 mL via OROMUCOSAL
  Filled 2013-12-20 (×31): qty 15

## 2013-12-20 MED ORDER — FENTANYL CITRATE 0.05 MG/ML IJ SOLN
INTRAMUSCULAR | Status: AC
  Administered 2013-12-20: 100 ug
  Filled 2013-12-20: qty 4

## 2013-12-20 MED ORDER — ONDANSETRON HCL 4 MG/2ML IJ SOLN: 4.0000 mg | Freq: Four times a day (QID) | INTRAMUSCULAR | Status: DC | PRN

## 2013-12-20 MED ORDER — NALOXONE HCL 1 MG/ML IJ SOLN
1.0000 mg | Freq: Once | INTRAMUSCULAR | Status: AC
  Administered 2013-12-20: 1 mg via INTRAVENOUS
  Filled 2013-12-20: qty 2

## 2013-12-20 MED ORDER — MIDAZOLAM HCL 2 MG/2ML IJ SOLN
INTRAMUSCULAR | Status: AC
  Administered 2013-12-20: 2 mg
  Filled 2013-12-20: qty 4

## 2013-12-20 MED ORDER — HEPARIN BOLUS VIA INFUSION (CRRT)
1000.0000 [IU] | INTRAVENOUS | Status: DC | PRN
  Administered 2013-12-20: 1000 [IU] via INTRAVENOUS_CENTRAL
  Filled 2013-12-20 (×2): qty 1000

## 2013-12-20 MED ORDER — VANCOMYCIN HCL IN DEXTROSE 1-5 GM/200ML-% IV SOLN
1000.0000 mg | Freq: Once | INTRAVENOUS | Status: DC
  Filled 2013-12-20: qty 200

## 2013-12-20 MED ORDER — SODIUM CHLORIDE 0.9 % IV BOLUS (SEPSIS): 500.0000 mL | Freq: Once | INTRAVENOUS | Status: DC

## 2013-12-20 MED ORDER — ONDANSETRON HCL 4 MG PO TABS: 4.0000 mg | ORAL_TABLET | Freq: Four times a day (QID) | ORAL | Status: DC | PRN

## 2013-12-20 MED ORDER — VALPROATE SODIUM 500 MG/5ML IV SOLN
500.0000 mg | Freq: Three times a day (TID) | INTRAVENOUS | Status: DC
  Administered 2013-12-21 – 2013-12-22 (×4): 500 mg via INTRAVENOUS
  Filled 2013-12-20 (×6): qty 5

## 2013-12-20 MED ORDER — SODIUM BICARBONATE 8.4 % IV SOLN
INTRAVENOUS | Status: AC
  Filled 2013-12-20: qty 50

## 2013-12-20 MED ORDER — ALBUTEROL SULFATE (2.5 MG/3ML) 0.083% IN NEBU: 2.5000 mg | INHALATION_SOLUTION | RESPIRATORY_TRACT | Status: DC | PRN

## 2013-12-20 MED ORDER — VANCOMYCIN HCL IN DEXTROSE 1-5 GM/200ML-% IV SOLN
1000.0000 mg | INTRAVENOUS | Status: DC
  Filled 2013-12-20: qty 200

## 2013-12-20 MED ORDER — NOREPINEPHRINE BITARTRATE 1 MG/ML IV SOLN
2.0000 ug/min | INTRAVENOUS | Status: DC
  Administered 2013-12-20: 5 ug/min via INTRAVENOUS
  Filled 2013-12-20: qty 4

## 2013-12-20 MED ORDER — ETOMIDATE 2 MG/ML IV SOLN
20.0000 mg | Freq: Once | INTRAVENOUS | Status: AC
  Administered 2013-12-20: 20 mg via INTRAVENOUS

## 2013-12-20 MED ORDER — HEPARIN SODIUM (PORCINE) 1000 UNIT/ML DIALYSIS
1000.0000 [IU] | INTRAMUSCULAR | Status: DC | PRN
  Administered 2013-12-21 (×2): 1200 [IU] via INTRAVENOUS_CENTRAL
  Filled 2013-12-20: qty 2
  Filled 2013-12-20: qty 5
  Filled 2013-12-20: qty 6

## 2013-12-20 MED ORDER — ALBUMIN HUMAN 5 % IV SOLN
12.5000 g | Freq: Once | INTRAVENOUS | Status: AC
  Administered 2013-12-20: 12.5 g via INTRAVENOUS
  Filled 2013-12-20: qty 250

## 2013-12-20 MED ORDER — IPRATROPIUM-ALBUTEROL 0.5-2.5 (3) MG/3ML IN SOLN
3.0000 mL | Freq: Four times a day (QID) | RESPIRATORY_TRACT | Status: DC
  Administered 2013-12-20 – 2013-12-23 (×13): 3 mL via RESPIRATORY_TRACT
  Filled 2013-12-20 (×13): qty 3

## 2013-12-20 MED ORDER — METRONIDAZOLE IN NACL 5-0.79 MG/ML-% IV SOLN
500.0000 mg | Freq: Three times a day (TID) | INTRAVENOUS | Status: DC
  Administered 2013-12-20: 500 mg via INTRAVENOUS
  Filled 2013-12-20 (×2): qty 100

## 2013-12-20 MED ORDER — FAMOTIDINE IN NACL 20-0.9 MG/50ML-% IV SOLN
20.0000 mg | Freq: Two times a day (BID) | INTRAVENOUS | Status: DC
  Administered 2013-12-20 (×2): 20 mg via INTRAVENOUS
  Filled 2013-12-20 (×2): qty 50

## 2013-12-20 MED ORDER — CETYLPYRIDINIUM CHLORIDE 0.05 % MT LIQD: 7.0000 mL | Freq: Two times a day (BID) | OROMUCOSAL | Status: DC

## 2013-12-20 MED ORDER — INSULIN ASPART 100 UNIT/ML ~~LOC~~ SOLN
2.0000 [IU] | SUBCUTANEOUS | Status: DC
  Administered 2013-12-20: 4 [IU] via SUBCUTANEOUS
  Administered 2013-12-20: 2 [IU] via SUBCUTANEOUS

## 2013-12-20 MED ORDER — SODIUM CHLORIDE 0.9 % IJ SOLN
10.0000 mL | Freq: Two times a day (BID) | INTRAMUSCULAR | Status: DC
  Administered 2013-12-21: 20 mL
  Administered 2013-12-22 – 2013-12-31 (×15): 10 mL

## 2013-12-20 MED ORDER — CETYLPYRIDINIUM CHLORIDE 0.05 % MT LIQD
7.0000 mL | Freq: Four times a day (QID) | OROMUCOSAL | Status: DC
  Administered 2013-12-20 – 2014-01-04 (×57): 7 mL via OROMUCOSAL

## 2013-12-20 MED ORDER — ACETAMINOPHEN 650 MG RE SUPP: 650.0000 mg | Freq: Four times a day (QID) | RECTAL | Status: DC | PRN

## 2013-12-20 MED ORDER — SODIUM CHLORIDE 0.9 % IV SOLN: INTRAVENOUS | Status: DC

## 2013-12-20 MED ORDER — SODIUM CHLORIDE 0.9 % IV BOLUS (SEPSIS)
1000.0000 mL | INTRAVENOUS | Status: DC | PRN
  Administered 2013-12-20: 1000 mL via INTRAVENOUS

## 2013-12-20 MED ORDER — SODIUM CHLORIDE 0.9 % IV SOLN
0.0000 ug/h | INTRAVENOUS | Status: DC
  Administered 2013-12-20: 100 ug/h via INTRAVENOUS
  Administered 2013-12-20: 200 ug/h via INTRAVENOUS
  Administered 2013-12-20: 50 ug/h via INTRAVENOUS
  Filled 2013-12-20 (×3): qty 50

## 2013-12-20 MED ORDER — NOREPINEPHRINE BITARTRATE 1 MG/ML IV SOLN
2.0000 ug/min | INTRAVENOUS | Status: DC
  Administered 2013-12-21: 3.013 ug/min via INTRAVENOUS
  Administered 2013-12-22: 4 ug/min via INTRAVENOUS
  Filled 2013-12-20 (×2): qty 4

## 2013-12-20 MED ORDER — HEPARIN SODIUM (PORCINE) 1000 UNIT/ML IJ SOLN
1000.0000 [IU] | Freq: Once | INTRAMUSCULAR | Status: AC | PRN
  Administered 2013-12-20: 2400 [IU] via INTRAVENOUS
  Filled 2013-12-20: qty 4

## 2013-12-20 MED ORDER — SODIUM CHLORIDE 0.9 % IV BOLUS (SEPSIS)
1000.0000 mL | Freq: Once | INTRAVENOUS | Status: AC
  Administered 2013-12-20: 1000 mL via INTRAVENOUS

## 2013-12-20 MED ORDER — SODIUM CHLORIDE 0.9 % IJ SOLN
3.0000 mL | Freq: Two times a day (BID) | INTRAMUSCULAR | Status: DC
  Administered 2013-12-22 – 2013-12-27 (×8): 3 mL via INTRAVENOUS

## 2013-12-20 MED ORDER — PRISMASOL BGK 4/2.5 32-4-2.5 MEQ/L IV SOLN
INTRAVENOUS | Status: DC
  Administered 2013-12-20 – 2013-12-24 (×15): via INTRAVENOUS_CENTRAL
  Filled 2013-12-20 (×17): qty 5000

## 2013-12-20 MED ORDER — PRISMASOL BGK 4/2.5 32-4-2.5 MEQ/L IV SOLN
INTRAVENOUS | Status: DC
  Administered 2013-12-20 – 2013-12-24 (×35): via INTRAVENOUS_CENTRAL
  Filled 2013-12-20 (×47): qty 5000

## 2013-12-20 MED ORDER — ROCURONIUM BROMIDE 50 MG/5ML IV SOLN
40.0000 mg | Freq: Once | INTRAVENOUS | Status: AC
  Administered 2013-12-20: 40 mg via INTRAVENOUS
  Filled 2013-12-20: qty 4

## 2013-12-20 MED ORDER — SODIUM CHLORIDE 0.9 % IV SOLN
INTRAVENOUS | Status: DC
  Administered 2013-12-20 – 2013-12-21 (×3): via INTRAVENOUS

## 2013-12-20 MED ORDER — NALOXONE HCL 0.4 MG/ML IJ SOLN
0.4000 mg | INTRAMUSCULAR | Status: DC | PRN
  Administered 2013-12-20: 0.4 mg via INTRAVENOUS
  Filled 2013-12-20: qty 1

## 2013-12-20 MED ORDER — VANCOMYCIN 50 MG/ML ORAL SOLUTION
500.0000 mg | Freq: Four times a day (QID) | ORAL | Status: DC
  Filled 2013-12-20 (×5): qty 10

## 2013-12-20 MED ORDER — PIPERACILLIN-TAZOBACTAM 3.375 G IVPB 30 MIN
3.3750 g | Freq: Once | INTRAVENOUS | Status: DC
  Filled 2013-12-20: qty 50

## 2013-12-20 MED ORDER — VANCOMYCIN HCL IN DEXTROSE 1-5 GM/200ML-% IV SOLN
1000.0000 mg | Freq: Once | INTRAVENOUS | Status: AC
  Administered 2013-12-20: 1000 mg via INTRAVENOUS
  Filled 2013-12-20: qty 200

## 2013-12-20 MED ORDER — PRISMASOL BGK 4/2.5 32-4-2.5 MEQ/L IV SOLN
INTRAVENOUS | Status: DC
  Administered 2013-12-20 – 2013-12-24 (×12): via INTRAVENOUS_CENTRAL
  Filled 2013-12-20 (×20): qty 5000

## 2013-12-20 MED ORDER — SODIUM CHLORIDE 0.9 % IJ SOLN
10.0000 mL | INTRAMUSCULAR | Status: DC | PRN
  Administered 2013-12-20: 20 mL
  Filled 2013-12-20: qty 20

## 2013-12-20 MED ORDER — ACETAMINOPHEN 160 MG/5ML PO SOLN
650.0000 mg | Freq: Four times a day (QID) | ORAL | Status: DC | PRN
  Administered 2013-12-20 – 2014-01-01 (×3): 650 mg
  Filled 2013-12-20 (×3): qty 20.3

## 2013-12-20 MED ORDER — FENTANYL CITRATE 0.05 MG/ML IJ SOLN
50.0000 ug | Freq: Once | INTRAMUSCULAR | Status: AC
  Administered 2013-12-20: 50 ug via INTRAVENOUS

## 2013-12-20 MED ORDER — SODIUM BICARBONATE 8.4 % IV SOLN
INTRAVENOUS | Status: AC
  Administered 2013-12-20: 100 meq
  Filled 2013-12-20: qty 50

## 2013-12-20 MED ORDER — CHLORHEXIDINE GLUCONATE 0.12 % MT SOLN
OROMUCOSAL | Status: AC
  Administered 2013-12-20: 15 mL
  Filled 2013-12-20: qty 15

## 2013-12-20 MED ORDER — FAMOTIDINE IN NACL 20-0.9 MG/50ML-% IV SOLN: 20.0000 mg | INTRAVENOUS | Status: DC

## 2013-12-20 MED ORDER — VALPROATE SODIUM 500 MG/5ML IV SOLN
1.0000 g | Freq: Once | INTRAVENOUS | Status: AC
  Administered 2013-12-20: 1000 mg via INTRAVENOUS
  Filled 2013-12-20: qty 10

## 2013-12-20 MED ORDER — ACETAMINOPHEN 325 MG PO TABS: 650.0000 mg | ORAL_TABLET | Freq: Four times a day (QID) | ORAL | Status: DC | PRN

## 2013-12-20 MED ORDER — PIPERACILLIN-TAZOBACTAM 3.375 G IVPB
3.3750 g | Freq: Three times a day (TID) | INTRAVENOUS | Status: AC
  Administered 2013-12-20: 3.375 g via INTRAVENOUS
  Filled 2013-12-20 (×3): qty 50

## 2013-12-20 MED ORDER — PIPERACILLIN-TAZOBACTAM IN DEX 2-0.25 GM/50ML IV SOLN
2.2500 g | Freq: Four times a day (QID) | INTRAVENOUS | Status: DC
  Administered 2013-12-20 – 2013-12-22 (×8): 2.25 g via INTRAVENOUS
  Filled 2013-12-20 (×11): qty 50

## 2013-12-20 NOTE — Consult Note (Signed)
PULMONARY / CRITICAL CARE MEDICINE   Name: RACHEL SAMPLES MRN: [id number] DOB: 1966/04/13    ADMISSION DATE:  12/22/2013 CONSULTATION DATE:  12/07/2013  REFERRING MD :  Dr. Posey Pronto   CHIEF COMPLAINT:  AKI, AMS, Acute Resp Fx  INITIAL PRESENTATION: 48 yr old male admitted pyelo, septic shock, ARF  STUDIES:  8/14 CT ABD/Pelvis ([facility]) >> no hydor, concern for L pyelo 8/14 CXR ([facility]) >> low lung volumes, vascular congestion 8/14 CT Head/Neck ([facility]) >> no acute intracranial abnormalities, no acute injury to cervical spine  SIGNIFICANT EVENTS: 8/14 Admit to [facility] from [facility], SNF resident with fever & AMS   HISTORY OF PRESENT ILLNESS:  48 y/o M, SNF Resident, with PMH of Developmental delay, DJD, O2 dependent COPD / Tobacco Abuse, OSA, Cervical / Lumbosacral spondylosis & recent admission to [facility] for back pain & falls who presented to [facility] on 12/31/2013 with fever and AMS.  Reportedly he has had a worsening mental status for 3-4 days prior to admission.  Work up at [facility] remarkable for UA positive for UTI, lactic acid 1.1, Na 140, K 5.1, AG 22, sr cr 8.95, Alk phos 106, CK 560, INR 1.2, WBC 9.6, Hgb 11.7.  Given acute kidney injury, patient was transferred to [facility] for further care.    Upon arrival to [facility], the patient was assessed by [facility] and found to have concerns for posturing and inability to protect his airway and shock and PCCM emergently consulted for evaluation.  Patient was intubated and HD catheter placed after assessment.   PAST MEDICAL HISTORY :  Past Medical History  Diagnosis Date  . Acute respiratory failure   . Hypoxemia   . Encephalopathy, unspecified   . Pain in limb   . Hyperosmolality and/or hypernatremia   . Dehydration   . Altered mental status   . Other specific developmental learning difficulties   . Acidosis   . Gallstone ileus   . Unspecified vitamin B deficiency   . Tobacco use disorder   . Acute conjunctivitis,  unspecified   . Other chronic allergic conjunctivitis   . Unspecified essential hypertension   . Cellulitis and abscess of unspecified site   . Muscle weakness (generalized)   . Cognitive communication deficit   . COPD (chronic obstructive pulmonary disease)   . Sleep apnea   . 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 Acute respiratory failure with hypoxia 09/20/2012  . Acute respiratory acidosis 09/20/2012  . Cellulitis of left lower extremity 09/20/2012  . OSA (obstructive sleep apnea) 09/20/2012  . Lumbosacral spondylosis without myelopathy 12/03/2013  . Degenerative joint disease of spine 12/03/2013  . Cervical spondylosis with myelopathy 12/03/2013   Past Surgical History  Procedure Laterality Date  . Eye surgery      cataracts  . Radiology with anesthesia N/A 12/09/2013    Procedure: MRI OF LUMBAR W/WO CONTRAST ;  Surgeon: Medication Radiologist, MD;  Location: [facility];  Service: Radiology;  Laterality: N/A;   Prior to Admission medications   Medication Sig Start Date End Date Taking? Authorizing Provider  albuterol (PROVENTIL) (5 MG/ML) 0.5% nebulizer solution Take 0.5 mLs (2.5 mg total) by nebulization every 6 (six) hours as needed for wheezing. 09/24/12   Bobby Rumpf York, PA-C  baclofen (LIORESAL) 5 mg TABS tablet Take 0.5 tablets (5 mg total) by mouth 3 (three) times daily. 12/11/13   Charlynne Cousins, MD  budesonide (PULMICORT) 0.25 MG/2ML nebulizer solution Take 2 mLs (0.25 mg total) by nebulization 2 (two)  times daily. 09/24/12   Bobby Rumpf York, PA-C  budesonide (PULMICORT) 180 MCG/ACT inhaler Inhale into the lungs 2 (two) times daily.    Historical Provider, MD  cyclobenzaprine (FLEXERIL) 10 MG tablet Take 1 tablet (10 mg total) by mouth 2 (two) times daily as needed for muscle spasms. 11/28/13   Johnna Acosta, MD  HYDROcodone-acetaminophen (NORCO/VICODIN) 5-325 MG per tablet Take 1 tablet by mouth every 4 (four) hours as needed for moderate pain. 12/02/13   Evalee Jefferson, PA-C  ibuprofen  (ADVIL,MOTRIN) 800 MG tablet Take 800 mg by mouth every 8 (eight) hours as needed.    Historical Provider, MD   No Known Allergies  FAMILY HISTORY:  Family History  Problem Relation Age of Onset  . Lung cancer Father     deceased  . Diabetes Mother    SOCIAL HISTORY:  reports that he has been smoking.  He does not have any smokeless tobacco history on file. He reports that he does not drink alcohol or use illicit drugs.  REVIEW OF SYSTEMS:  Unable to complete as patient is altered.  No family available at this time.  Information obtained from previous medical documentation.    SUBJECTIVE: RN reports significant watery stool output  VITAL SIGNS: Temp:  [97.5 F (36.4 C)-97.8 F (36.6 C)] 97.5 F (36.4 C) (08/14 0734) Pulse Rate:  [82] 82 (08/14 0800) Resp:  [20] 20 (08/14 0800) BP: (85-95)/(43-50) 85/43 mmHg (08/14 0600) FiO2 (%):  [100 %] 100 % (08/14 0800) Weight:  [191 lb 5.8 oz (86.8 kg)] 191 lb 5.8 oz (86.8 kg) (08/14 0600)  HEMODYNAMICS:    VENTILATOR SETTINGS: Vent Mode:  [-] PRVC FiO2 (%):  [100 %] 100 % Set Rate:  [20 bmp] 20 bmp Vt Set:  [500 mL] 500 mL PEEP:  [5 cmH20] 5 cmH20  INTAKE / OUTPUT: No intake or output data in the 24 hours ending 12/09/2013 0820  PHYSICAL EXAMINATION: General:  Chronically ill in acute distress Neuro:  Obtunded, pupils pinpoint, concern for decorticate posturing HEENT:  Mm dry, poor dentition Cardiovascular:  s1s2 rrr, no m/r/g Lungs:  resp's even/non-labored on vent, lungs bilaterally coarse Abdomen:  Obese, soft, bsx4 active  Musculoskeletal:  No acute deformities  Skin:  Warm/dry, no edema  LABS:  CBC No results found for this basename: WBC, HGB, HCT, PLT,  in the last 168 hours Coag's No results found for this basename: APTT, INR,  in the last 168 hours BMET No results found for this basename: NA, K, CL, CO2, BUN, CREATININE, GLUCOSE,  in the last 168 hours Electrolytes No results found for this basename: CALCIUM,  MG, PHOS,  in the last 168 hours Sepsis Markers No results found for this basename: LATICACIDVEN, PROCALCITON, O2SATVEN,  in the last 168 hours ABG  Recent Labs Lab 12/07/2013 0816  PHART 7.345*  PCO2ART 45.0  PO2ART 461.0*   Liver Enzymes No results found for this basename: AST, ALT, ALKPHOS, BILITOT, ALBUMIN,  in the last 168 hours Cardiac Enzymes No results found for this basename: TROPONINI, PROBNP,  in the last 168 hours Glucose No results found for this basename: GLUCAP,  in the last 168 hours  Imaging No results found.   ASSESSMENT / PLAN:  PULMONARY OETT 8/14 >> A: Acute Respiratory Failure - in setting of AMS, AKI COPD Tobacco Abuse  R/O PNA P:   See ID Full support, 8cc/kg F/u ABG now & in am, likely to need rate increase Assess CXR post ETT placement  Scheduled  Duonebs + PRN albuterol To goal 40% if able  CARDIOVASCULAR CVL R IJ HD 8/14 >> A:  Septic Shock - in setting of UTI/Pyelo Bradycardia  R/O MI  P:  Trend troponin Follow EKG Levophed for MAP > 65 NS @ 125 EGDT chosen Lactic acid  cvp  RENAL A:   AKI  Likely ATN, no hydro on CT P:   Nephrology Consulted NS @ 125  S/P 2 amps bicarb prior to intubation Line placed as HD cath incase require HD / CVVHD Stat k , bmet  GASTROINTESTINAL A:   Diarrhea - r/o C-Diff P:   Assess C-Diff PCR NPO OGT, consider feeding in am 8/15 - vital SUP: Pepcid given concerns for C-Diff LFT now  HEMATOLOGIC A:   Anemia - mild, likely chronic  P:  DVT: heparin sq Monitor CBC  INFECTIOUS A:   R/O ASPiration R/O C-Diff  Diarrhea pyelonephritis P:   BCx2 8/14 >> UC 8/14 >> Sputum 8/14 >>  Abx: Oral Vanco, start date 8/14, day 1/x PO vanco, start date 8/14, day 1/x IV zosyn, start date 8/14, day 1/x Flagyl, start date 8/14, day 1/x  ENDOCRINE A:   Hyperglycemia - no hx of DM  P:   ICU SSI  Assess TSH cortisol  NEUROLOGIC A:   Acute Encephalopathy - in setting of AKI R/o CVA,  anoxia P:   RASS goal: -3 to -2 Repeat CT head with posturing Sedation protocol, fentanyl gtt Serial neuro exams Neurology consulted per [facility]  TODAY'S SUMMARY: 48 y/o M admitted with Pyelo / UTI, AKI & AMS.  Septic shock on arrival with posturing.  Ongoing resuscitation.  Nephrology / Neurology consults pending  Noe Gens, NP-C [facility] Pgr: [phone] or [phone]   I have personally obtained a history, examined the patient, evaluated laboratory and imaging results, formulated the assessment and plan and placed orders.  CRITICAL CARE: The patient is critically ill with multiple organ systems failure and requires high complexity decision making for assessment and support, frequent evaluation and titration of therapies, application of advanced monitoring technologies and extensive interpretation of multiple databases. Critical Care Time devoted to patient care services described in this note is 90 minutes.     12/22/2013, 8:20 AM  Lavon Paganini. Titus Mould, MD, [place] Pgr: [facility]

## 2013-12-20 NOTE — Progress Notes (Signed)
INITIAL NUTRITION ASSESSMENT  DOCUMENTATION CODES Per approved criteria  -Obesity Unspecified   INTERVENTION: If TF are initiated, recommend Vital HP @ 20 ml/hr via OG tube and increase by 10 ml every 4 hours to goal rate of 55 ml/hr.   Tube feeding regimen provides 1320 kcal (100% of needs), 116 grams of protein, and 1109 ml of H2O.   NUTRITION DIAGNOSIS: Inadequate oral intake related to inability to eat as evidenced by NPO.   Goal: Enteral nutrition to provide 60-70% of estimated calorie needs (22-25 kcals/kg ideal body weight) and 100% of estimated protein needs, based on ASPEN guidelines for permissive underfeeding in critically ill obese individuals  Monitor:  Weight trends, I/O's, po intake  Reason for Assessment: New Vent  48 y.o. male  Admitting Dx: Septic shock  ASSESSMENT: 48 y/o M, SNF Resident, with PMH of Developmental delay, DJD, O2 dependent COPD / Tobacco Abuse, OSA, Cervical / Lumbosacral spondylosis & recent admission to [facility] for back pain & falls who presented to [facility] on 01-02-2014 with fever and AMS. Reportedly he has had a worsening mental status for 3-4 days prior to admission. Work up at [facility] remarkable for UA positive for UTI. Given acute kidney injury, patient was transferred to [facility] for further care. Upon arrival to [facility], the patient was assessed by [facility] and found to have concerns for posturing and inability to protect his airway and shock and PCCM emergently consulted for evaluation. Patient was intubated and HD catheter placed after assessment.  Patient is currently intubated on ventilator support MV: 14.3 L/min Temp (24hrs), Avg:97.7 F (36.5 C), Min:97.5 F (36.4 C), Max:97.8 F (36.6 C)  - Pt with no signs of fat or muscle wasting - Per MD, TF will be considered 8/15 am  CBG's 81-148  Height: Ht Readings from Last 1 Encounters:  01/02/14 5' 1.81" (1.57 m)    Weight: Wt Readings from Last 1 Encounters:   02-Jan-2014 191 lb 5.8 oz (86.8 kg)    Ideal Body Weight: 54.2 kg  % Ideal Body Weight: 160%  Wt Readings from Last 10 Encounters:  01/02/14 191 lb 5.8 oz (86.8 kg)  12/11/13 191 lb 6.4 oz (86.818 kg)  12/11/13 191 lb 6.4 oz (86.818 kg)  12/01/13 200 lb (90.719 kg)  11/30/13 200 lb (90.719 kg)  11/28/13 200 lb (90.719 kg)  11/28/13 190 lb (86.183 kg)  10/16/12 246 lb 9.6 oz (111.857 kg)  09/19/12 200 lb (90.719 kg)   BMI:  Body mass index is 35.21 kg/(m^2).  Estimated Nutritional Needs: Kcal: 1200-1350 Protein: 110-120 g Fluid: Per MD  Skin: Intact  Diet Order: NPO  EDUCATION NEEDS: -Education needs addressed  No intake or output data in the 24 hours ending 01/02/2014 1016  Last BM: prior to admission   Labs:   Recent Labs Lab January 02, 2014 0815  NA 146  K 4.5  CL 110  CO2 21  BUN 86*  CREATININE 7.64*  CALCIUM 6.2*  GLUCOSE 140*    CBG (last 3)  No results found for this basename: GLUCAP,  in the last 72 hours  Scheduled Meds: . albumin human  12.5 g Intravenous Once  . antiseptic oral rinse  7 mL Mouth Rinse BID  . famotidine (PEPCID) IV  20 mg Intravenous Q12H  . heparin  5,000 Units Subcutaneous 3 times per day  . insulin aspart  2-6 Units Subcutaneous 6 times per day  . ipratropium-albuterol  3 mL Nebulization Q6H  . vancomycin  500 mg Oral 4 times per day   And  . metronidazole  500 mg Intravenous Q8H  . piperacillin-tazobactam (ZOSYN)  IV  2.25 g Intravenous Q6H  . piperacillin-tazobactam (ZOSYN)  IV  3.375 g Intravenous 3 times per day  . sodium bicarbonate      . sodium chloride  1,000 mL Intravenous Once  . sodium chloride  3 mL Intravenous Q12H    Continuous Infusions: . sodium chloride 125 mL/hr at 01/06/2014 0856  . fentaNYL infusion INTRAVENOUS 150 mcg/hr (12/25/2013 1010)  . norepinephrine (LEVOPHED) Adult infusion Stopped (12/28/2013 [id number])    Past Medical History  Diagnosis Date  . Acute respiratory failure   . Hypoxemia   .  Encephalopathy, unspecified   . Pain in limb   . Hyperosmolality and/or hypernatremia   . Dehydration   . Altered mental status   . Other specific developmental learning difficulties   . Acidosis   . Gallstone ileus   . Unspecified vitamin B deficiency   . Tobacco use disorder   . Acute conjunctivitis, unspecified   . Other chronic allergic conjunctivitis   . Unspecified essential hypertension   . Cellulitis and abscess of unspecified site   . Muscle weakness (generalized)   . Cognitive communication deficit   . COPD (chronic obstructive pulmonary disease)   . Sleep apnea   . 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 Acute respiratory failure with hypoxia 09/20/2012  . Acute respiratory acidosis 09/20/2012  . Cellulitis of left lower extremity 09/20/2012  . OSA (obstructive sleep apnea) 09/20/2012  . Lumbosacral spondylosis without myelopathy 12/03/2013  . Degenerative joint disease of spine 12/03/2013  . Cervical spondylosis with myelopathy 12/03/2013    Past Surgical History  Procedure Laterality Date  . Eye surgery      cataracts  . Radiology with anesthesia N/A 12/09/2013    Procedure: MRI OF LUMBAR W/WO CONTRAST ;  Surgeon: Medication Radiologist, MD;  Location: [facility] OR;  Service: Radiology;  Laterality: N/A;    Ebbie LatusHaley Hawkins RD, LDN

## 2013-12-20 NOTE — Progress Notes (Addendum)
ANTIBIOTIC CONSULT NOTE - INITIAL  Pharmacy Consult for vancomycin and zosyn Indication: sepsis  No Known Allergies  Patient Measurements: Height: 5' 1.81" (157 cm) Weight: 191 lb 5.8 oz (86.8 kg) IBW/kg (Calculated) : 54.17   Vital Signs: Temp: 97.8 F (36.6 C) (08/14 0550) Temp src: Oral (08/14 0550) BP: 85/43 mmHg (08/14 0600) Intake/Output from previous day:   Intake/Output from this shift:    Labs: No results found for this basename: WBC, HGB, PLT, LABCREA, CREATININE,  in the last 72 hours Estimated Creatinine Clearance: 80.4 ml/min (by C-G formula based on Cr of 1.08). No results found for this basename: VANCOTROUGH, VANCOPEAK, VANCORANDOM, GENTTROUGH, GENTPEAK, GENTRANDOM, TOBRATROUGH, TOBRAPEAK, TOBRARND, AMIKACINPEAK, AMIKACINTROU, AMIKACIN,  in the last 72 hours   Microbiology: No results found for this or any previous visit (from the past 720 hour(s)).  Medical History: Past Medical History  Diagnosis Date  . Acute respiratory failure   . Hypoxemia   . Encephalopathy, unspecified   . Pain in limb   . Hyperosmolality and/or hypernatremia   . Dehydration   . Altered mental status   . Other specific developmental learning difficulties   . Acidosis   . Gallstone ileus   . Unspecified vitamin B deficiency   . Tobacco use disorder   . Acute conjunctivitis, unspecified   . Other chronic allergic conjunctivitis   . Unspecified essential hypertension   . Cellulitis and abscess of unspecified site   . Muscle weakness (generalized)   . Cognitive communication deficit   . COPD (chronic obstructive pulmonary disease)   . Sleep apnea   . 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 Acute respiratory failure with hypoxia 09/20/2012  . Acute respiratory acidosis 09/20/2012  . Cellulitis of left lower extremity 09/20/2012  . OSA (obstructive sleep apnea) 09/20/2012  . Lumbosacral spondylosis without myelopathy 12/03/2013  . Degenerative joint disease of spine 12/03/2013  .  Cervical spondylosis with myelopathy 12/03/2013    Medications:  See EMR  Assessment: 48 year old [facility] with [facility] distress and possible sepsis. No fevers noted, labs pending. Vancomycin and zosyn x1 already ordered. Will await labs before subsequent antibiotic dosing.  Goal of Therapy:  Vancomycin trough level 15-20 mcg/ml  Plan:  Measure antibiotic drug levels at steady state Follow up culture results Awaiting labs for further antibiotic dosing  Sheppard CoilFrank Wilson PharmD., BCPS Clinical Pharmacist Pager [phone] 05/29/2013 6:58 AM   ----------------------- Addendum: SCr 7.64 - Patient in acute renal failure.  Patient given Vanc 1 gram and Zosyn 3.375 grams this AM.   Plan: Change Zosyn to 2.25g IV q6h. Will not schedule vancomycin at this time- will check a random vancomycin level on 8/15 AM to determine further dosing.   Follow-up renal function, clinical status, and culture results.   Link SnufferJessica Ellinore Merced, PharmD, BCPS Clinical Pharmacist [phone] 05/29/2013, 9:58 AM  Addendum: Initiating CRRT today per renal for AKI.   Plan: Continue Zosyn at 2.25g IV q6h.  D/C vancomycin level and start Vancomycin 1gm IV q24h while on CRRT.   Link SnufferJessica Oaklynn Stierwalt, PharmD, BCPS Clinical Pharmacist [phone] 05/29/2013, 11:12 AM

## 2013-12-20 NOTE — Progress Notes (Signed)
CRITICAL VALUE ALERT  Critical value received: Calcium 6.2  Date of notification:  01/03/2014  Time of notification:  0922  Critical value read back:Yes.    Nurse who received alert: Pasty ArchSTEPHANIE DIXON,RN  MD notified (1st page):  Job FoundsBRANDI OLIVER, NP  Time of first page:  [phone]  MD notified (2nd page):  Time of second page:  Responding MD:  Job FoundsBRANDI OLIVER, NP  Time MD responded:  [phone]

## 2013-12-20 NOTE — Consult Note (Signed)
Reason for Consult:AKI Referring Physician: Dr. Jamelle Rushing [name] is an 48 y.o. male.  HPI: 48 yr male with extensive hx including developmental delay, institutionalization,  COPD with ongoing tobacco abuse, OSA, spasticity with contractures with know cervical and lumbar disease, recurrent falls secondary to that, recurrent CO2 narcosis with narcotics, ? Primary hypersomnia, hx vert fx,  Hypernatremia ?? Etiology. Was hosp 7/25-8/3 with Neuo and NS input. Did get MRI with IV contrast, and D/C on tid Ibuprofen.  MRI did show large bladder, not addressed.  Cr was approx 1 @ d/c and now 7.6.  Apparently less responsive this am, sent to [facility], then here because of hypotension and probable septic schock. .  Apparently had CT with ?pyelo and has RBC and WBC in urine.  No other hx available but apparently no hx CKD. Review of systems not obtained due to patient factors.   Past Medical History  Diagnosis Date  . Acute respiratory failure   . Hypoxemia   . Encephalopathy, unspecified   . Pain in limb   . Hyperosmolality and/or hypernatremia   . Dehydration   . Altered mental status   . Other specific developmental learning difficulties   . Acidosis   . Gallstone ileus   . Unspecified vitamin B deficiency   . Tobacco use disorder   . Acute conjunctivitis, unspecified   . Other chronic allergic conjunctivitis   . Unspecified essential hypertension   . Cellulitis and abscess of unspecified site   . Muscle weakness (generalized)   . Cognitive communication deficit   . COPD (chronic obstructive pulmonary disease)   . Sleep apnea   . 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 Acute respiratory failure with hypoxia 09/20/2012  . Acute respiratory acidosis 09/20/2012  . Cellulitis of left lower extremity 09/20/2012  . OSA (obstructive sleep apnea) 09/20/2012  . Lumbosacral spondylosis without myelopathy 12/03/2013  . Degenerative joint disease of spine 12/03/2013  . Cervical spondylosis with  myelopathy 12/03/2013    Past Surgical History  Procedure Laterality Date  . Eye surgery      cataracts  . Radiology with anesthesia N/A 12/09/2013    Procedure: MRI OF LUMBAR W/WO CONTRAST ;  Surgeon: Medication Radiologist, MD;  Location: [place];  Service: Radiology;  Laterality: N/A;    Family History  Problem Relation Age of Onset  . Lung cancer Father     deceased  . Diabetes Mother     Social History:  reports that he has been smoking.  He does not have any smokeless tobacco history on file. He reports that he does not drink alcohol or use illicit drugs.  Allergies: No Known Allergies  Medications:  I have reviewed the patient's current medications. Prior to Admission:  Prescriptions prior to admission  Medication Sig Dispense Refill  . albuterol (PROVENTIL) (5 MG/ML) 0.5% nebulizer solution Take 0.5 mLs (2.5 mg total) by nebulization every 6 (six) hours as needed for wheezing.  20 mL  12  . baclofen (LIORESAL) 5 mg TABS tablet Take 0.5 tablets (5 mg total) by mouth 3 (three) times daily.  30 each  0  . budesonide (PULMICORT) 0.25 MG/2ML nebulizer solution Take 2 mLs (0.25 mg total) by nebulization 2 (two) times daily.  60 mL  12  . budesonide (PULMICORT) 180 MCG/ACT inhaler Inhale into the lungs 2 (two) times daily.      . cyclobenzaprine (FLEXERIL) 10 MG tablet Take 1 tablet (10 mg total) by mouth 2 (two) times daily as needed for muscle  spasms.  10 tablet  0  . HYDROcodone-acetaminophen (NORCO/VICODIN) 5-325 MG per tablet Take 1 tablet by mouth every 4 (four) hours as needed for moderate pain.  20 tablet  0  . ibuprofen (ADVIL,MOTRIN) 800 MG tablet Take 800 mg by mouth every 8 (eight) hours as needed.         Results for orders placed during the hospital encounter of 12/26/2013 (from the past 48 hour(s))  MRSA PCR SCREENING     Status: None   Collection Time    12/14/2013  5:57 AM      Result Value Ref Range   MRSA by PCR NEGATIVE  NEGATIVE   Comment:            The  GeneXpert MRSA Assay (FDA     approved for NASAL specimens     only), is one component of a     comprehensive MRSA colonization     surveillance program. It is not     intended to diagnose MRSA     infection nor to guide or     monitor treatment for     MRSA infections.  CBC WITH DIFFERENTIAL     Status: Abnormal   Collection Time    12/19/2013  8:15 AM      Result Value Ref Range   WBC 6.5  4.0 - 10.5 K/uL   RBC 2.53 (*) 4.22 - 5.81 MIL/uL   Hemoglobin 8.2 (*) 13.0 - 17.0 g/dL   HCT 24.5 (*) 39.0 - 52.0 %   MCV 96.8  78.0 - 100.0 fL   MCH 32.4  26.0 - 34.0 pg   MCHC 33.5  30.0 - 36.0 g/dL   RDW 15.6 (*) 11.5 - 15.5 %   Platelets 106 (*) 150 - 400 K/uL   Comment: PLATELET COUNT CONFIRMED BY SMEAR   Neutrophils Relative % 81 (*) 43 - 77 %   Neutro Abs 5.3  1.7 - 7.7 K/uL   Lymphocytes Relative 12  12 - 46 %   Lymphs Abs 0.7  0.7 - 4.0 K/uL   Monocytes Relative 7  3 - 12 %   Monocytes Absolute 0.4  0.1 - 1.0 K/uL   Eosinophils Relative 0  0 - 5 %   Eosinophils Absolute 0.0  0.0 - 0.7 K/uL   Basophils Relative 0  0 - 1 %   Basophils Absolute 0.0  0.0 - 0.1 K/uL  COMPREHENSIVE METABOLIC PANEL     Status: Abnormal   Collection Time    12/25/2013  8:15 AM      Result Value Ref Range   Sodium 146  137 - 147 mEq/L   Potassium 4.5  3.7 - 5.3 mEq/L   Chloride 110  96 - 112 mEq/L   CO2 21  19 - 32 mEq/L   Glucose, Bld 140 (*) 70 - 99 mg/dL   BUN 86 (*) 6 - 23 mg/dL   Creatinine, Ser 7.64 (*) 0.50 - 1.35 mg/dL   Calcium 6.2 (*) 8.4 - 10.5 mg/dL   Comment: CRITICAL RESULT CALLED TO, READ BACK BY AND VERIFIED WITH:     DIXON,S RN @ [phone] 12/15/2013 LEONARD,A   Total Protein 4.5 (*) 6.0 - 8.3 g/dL   Albumin 1.5 (*) 3.5 - 5.2 g/dL   AST 19  0 - 37 U/L   ALT 22  0 - 53 U/L   Alkaline Phosphatase 86  39 - 117 U/L   Total Bilirubin <0.2 (*) 0.3 - 1.2 mg/dL  GFR calc non Af Amer 8 (*) >90 mL/min   GFR calc Af Amer 9 (*) >90 mL/min   Comment: (NOTE)     The eGFR has been calculated using  the CKD EPI equation.     This calculation has not been validated in all clinical situations.     eGFR's persistently <90 mL/min signify possible Chronic Kidney     Disease.   Anion gap 15  5 - 15  PROTIME-INR     Status: Abnormal   Collection Time    01/01/2014  8:15 AM      Result Value Ref Range   Prothrombin Time 16.8 (*) 11.6 - 15.2 seconds   INR 1.36  0.00 - 1.49  LACTIC ACID, PLASMA     Status: None   Collection Time    12/15/2013  8:15 AM      Result Value Ref Range   Lactic Acid, Venous 0.8  0.5 - 2.2 mmol/L  TROPONIN I     Status: None   Collection Time    12/31/2013  8:15 AM      Result Value Ref Range   Troponin I <0.30  <0.30 ng/mL   Comment:            Due to the release kinetics of cTnI,     a negative result within the first hours     of the onset of symptoms does not rule out     myocardial infarction with certainty.     If myocardial infarction is still suspected,     repeat the test at appropriate intervals.  APTT     Status: None   Collection Time    12/28/2013  8:15 AM      Result Value Ref Range   aPTT 35  24 - 37 seconds  FIBRINOGEN     Status: Abnormal   Collection Time    12/19/2013  8:15 AM      Result Value Ref Range   Fibrinogen >800 (*) 204 - 475 mg/dL  TYPE AND SCREEN     Status: None   Collection Time    12/26/2013  8:15 AM      Result Value Ref Range   ABO/RH(D) A POS     Antibody Screen NEG     Sample Expiration 12/23/2013    POCT I-STAT 3, ART BLOOD GAS (G3+)     Status: Abnormal   Collection Time    12/13/2013  8:16 AM      Result Value Ref Range   pH, Arterial 7.345 (*) 7.350 - 7.450   pCO2 arterial 45.0  35.0 - 45.0 mmHg   pO2, Arterial 461.0 (*) 80.0 - 100.0 mmHg   Bicarbonate 24.7 (*) 20.0 - 24.0 mEq/L   TCO2 26  0 - 100 mmol/L   O2 Saturation 100.0     Acid-base deficit 1.0  0.0 - 2.0 mmol/L   Patient temperature 97.5 F     Collection site ARTERIAL LINE     Drawn by Operator     Sample type ARTERIAL    CLOSTRIDIUM DIFFICILE BY PCR      Status: None   Collection Time    12/27/2013  8:40 AM      Result Value Ref Range   C difficile by pcr NEGATIVE  NEGATIVE  URINALYSIS, ROUTINE W REFLEX MICROSCOPIC     Status: Abnormal   Collection Time    01/06/2014  8:41 AM      Result Value Ref  Range   Color, Urine RED (*) YELLOW   Comment: BIOCHEMICALS MAY BE AFFECTED BY COLOR   APPearance TURBID (*) CLEAR   Specific Gravity, Urine 1.013  1.005 - 1.030   pH 6.5  5.0 - 8.0   Glucose, UA NEGATIVE  NEGATIVE mg/dL   Hgb urine dipstick LARGE (*) NEGATIVE   Bilirubin Urine NEGATIVE  NEGATIVE   Ketones, ur 15 (*) NEGATIVE mg/dL   Protein, ur >300 (*) NEGATIVE mg/dL   Urobilinogen, UA 0.2  0.0 - 1.0 mg/dL   Nitrite POSITIVE (*) NEGATIVE   Leukocytes, UA LARGE (*) NEGATIVE  URINE MICROSCOPIC-ADD ON     Status: Abnormal   Collection Time    01/01/2014  8:41 AM      Result Value Ref Range   WBC, UA TOO NUMEROUS TO COUNT  <3 WBC/hpf   RBC / HPF TOO NUMEROUS TO COUNT  <3 RBC/hpf   Bacteria, UA MANY (*) RARE  CARBOXYHEMOGLOBIN     Status: Abnormal   Collection Time    12/21/2013 10:25 AM      Result Value Ref Range   Total hemoglobin 10.2 (*) 13.5 - 18.0 g/dL   O2 Saturation 76.2     Carboxyhemoglobin 0.8  0.5 - 1.5 %   Methemoglobin 0.9  0.0 - 1.5 %    Dg Chest Port 1 View  12/26/2013   CLINICAL DATA:  Status post endotracheal tube placement and right internal jugular catheter placement.  EXAM: PORTABLE CHEST - 1 VIEW  COMPARISON:  Portable chest x-ray of December 20, 2013.  FINDINGS: The endotracheal tube tip lies approximately 4 cm above the crotch of the carina. The right internal jugular venous catheter tip lies in the proximal portion of the SVC. There is no postprocedure pneumothorax. The lung volumes remain low. The perihilar lung markings are increased. Atelectasis at the left lung base persists. The cardiac silhouette is mildly enlarged.  IMPRESSION: Interval placement of an endotracheal tube and right internal jugular venous  catheter without postprocedure complication.   Electronically Signed   By: David  Martinique   On: 12/11/2013 09:05    ROS Blood pressure 85/43, pulse 82, temperature 97.5 F (36.4 C), temperature source Oral, resp. rate 20, height 5' 1.81" (1.57 m), weight 86.8 kg (191 lb 5.8 oz). Physical Exam Physical Examination: General appearance - sedated, paralyzed, knees bent up Mental status - nonresponsive pupils pinpoint Eyes - as above, cannot see fundi Mouth - ET tube and bite block Neck - adenopathy noted PCL Lymphatics - posterior cervical nodes Chest - rhonchi noted bilat Heart - S1 and S2 normal, tr edema Abdomen - no bs, soft, liver down 5-6 cm Neurological - as above. Only rx upgoing toes Extremities - pedal edema tr + Skin - pale sallow, eczematoid changes  Assessment/Plan: 1 AKI  Multifactorial.  Clearly shock now with probable urinary source. Also evidence and reason for obstructive uropathy which may contrib to infx and AKI.  ? Role of NSAID .  Mild acidemia, Sarajane Jews and needs RRT. 2 Sepsis apparently urinary on BSAB 3 VDRF per CCM 4. Anemia signif change follow for other causes 5. Spinous disease with spasticity 6 Developmental delays 7 OSA 8 COPD P CRRT, U/S,AB, U Na & Cr  Aurianna Earlywine L 12/19/2013, 10:49 AM

## 2013-12-20 NOTE — Plan of Care (Signed)
Problem: Phase I Progression Outcomes Goal: Dyspnea controlled at rest Outcome: Not Progressing Pt had to be intubated

## 2013-12-20 NOTE — Progress Notes (Signed)
EEG completed; results pending.    

## 2013-12-20 NOTE — Procedures (Signed)
Arterial Catheter Insertion Procedure Note Lyman BishopMichael D Mittal [id number] [birth year]  Procedure: Insertion of Arterial Catheter  Indications: Blood pressure monitoring and Frequent blood sampling  Procedure Details Consent: Unable to obtain consent because of emergent medical necessity. Time Out: Verified patient identification, verified procedure, site/side was marked, verified correct patient position, special equipment/implants available, medications/allergies/relevent history reviewed, required imaging and test results available.  Performed  Maximum sterile technique was used including antiseptics, cap, gloves, gown, hand hygiene, mask and sheet. Skin prep: Chlorhexidine; local anesthetic administered 20 gauge catheter was inserted into right femoral artery using the Seldinger technique.  Evaluation Blood flow good; BP tracing good. Complications: No apparent complications.   Nelda BucksFEINSTEIN,Lorain Fettes J. 12-Jul-2013  Shock, no radials  Mcarthur Rossettianiel J. Tyson AliasFeinstein, MD, FACP Pgr: [phone] [facility]

## 2013-12-20 NOTE — Progress Notes (Signed)
0600 BP was 64/69 (44).  Meredith Pelalled ELINK MD to request assistance/orders. [facility] 1L fluid bolus and informed me that he would attempt to get the [facility] to come see the patient.  Infusing NS 64gtt/min. MD now at bedside. Will continue to monitor.  Devona KonigAvery Morrell Fluke, RN.

## 2013-12-20 NOTE — Procedures (Signed)
Intubation Procedure Note Joshua BishopMichael D Cummings [id number] [birth year]  Procedure: Intubation Indications: Respiratory insufficiency  Procedure Details Consent: Unable to obtain consent because of emergent medical necessity. Time Out: Verified patient identification, verified procedure, site/side was marked, verified correct patient position, special equipment/implants available, medications/allergies/relevent history reviewed, required imaging and test results available.  Performed  Maximum sterile technique was used including cap, gloves, gown, hand hygiene and mask.  MAC and 4    Evaluation Hemodynamic Status: Transient hypotension treated with pressors; O2 sats: stable throughout Patient's Current Condition: stable Complications: No apparent complications Patient did tolerate procedure well. Chest X-ray ordered to verify placement.  CXR: pending.   Nelda BucksFEINSTEIN,DANIEL J. 12/29/2013  Posturing? Airway protection, distress Mcarthur RossettiDaniel J. Tyson AliasFeinstein, MD, FACP Pgr: [phone] [place] [facility]

## 2013-12-20 NOTE — Progress Notes (Signed)
12/10/2013 0745  Vitals  BP ! 72/47 mmHg  MAP (mmHg) 52  Pulse Rate 88  ECG Heart Rate 89  Resp 17  Oxygen Therapy  SpO2 100 %  Dr. Tyson AliasFeinstein here and preparing to intubate and line patient.

## 2013-12-20 NOTE — Progress Notes (Addendum)
[facility] PROGRESS NOTE  [name]  ZOX:[id number]  DOB: 04/22/1966  DOS: 12/09/2013  PCP: Default, Provider, MD [facility]  [facility]down The patient presented with septic shock with UTI and possible pneumonia along with obtundation and acute renal failure with possible diarrhea. This was discussed with critical care, due to persistent low blood pressure despite 3 L of IV fluid the patient was transferred to ICU under Dr. Tyson AliasFeinstein. Appreciate their input. Neurology as well as nephrology was consulted for the patient will be following.  Author: Lynden OxfordPranav Patel, MD [facility] Pager: [phone] 01/01/2014 7:45 AM   If 7PM-7AM, please contact night-coverage at [email or web address], password [email or web address]

## 2013-12-20 NOTE — Progress Notes (Signed)
Echocardiogram 2D Echocardiogram has been performed.  [name] 12/11/2013, 12:15 PM

## 2013-12-20 NOTE — Progress Notes (Signed)
2013-07-28 1730  Vitals  BP 97/66 mmHg  MAP (mmHg) 73  ECG Heart Rate ! 120  Resp ! 29  Art Line  Arterial Line BP 114/55 mmHg  Arterial Line MAP (mmHg) 71 mmHg  Invasive Hemodynamic Monitoring  CVP (mmHg) 11 mmHg  Acetaminophen 650 mg solution given per og tube after okayed by Pola CornElink MD, Dr. Craige CottaSood. Had been notified placement just in stomach and had been unable to advance further. Bile colored drainage was draining via LIWS prior to administration.

## 2013-12-20 NOTE — Plan of Care (Signed)
Problem: Phase I Progression Outcomes Goal: Dyspnea controlled at rest Outcome: Not Progressing Intubated. Goal: Hemodynamically stable Outcome: Progressing On norepinephrine and receiving CRRT (keep even) Goal: Pt./family verbalizes plan of care Outcome: Progressing Explained briefly with sister. She will need to be updated by MDs involved in care

## 2013-12-20 NOTE — Progress Notes (Signed)
Called "Flow Manager" to notify that the patient [name] has arrived to unit.  Was informed that it would probably be the "0700 doctor" that saw the patient because "they just got swamped with calls from the ED".  Asked the flow manager to inform the doctor the that patient's most recent pressure was 85/43.  Awaiting orders/further instructions. Devona KonigAvery Janzen Sacks, RN.

## 2013-12-20 NOTE — Procedures (Signed)
Central Venous Catheter Insertion Procedure Note Lyman BishopMichael D Curran [id number] 1965/12/08  Procedure: Insertion of Central Venous Catheter Indications: Assessment of intravascular volume, Drug and/or fluid administration, Frequent blood sampling and possibel emergent HD  Procedure Details Consent: Unable to obtain consent because of emergent medical necessity. Time Out: Verified patient identification, verified procedure, site/side was marked, verified correct patient position, special equipment/implants available, medications/allergies/relevent history reviewed, required imaging and test results available.  Performed  Maximum sterile technique was used including antiseptics, cap, gloves, gown, hand hygiene, mask and sheet. Skin prep: Chlorhexidine; local anesthetic administered A antimicrobial bonded/coated triple lumen catheter was placed in the right internal jugular vein using the Seldinger technique.  Evaluation Blood flow good Complications: No apparent complications Patient did tolerate procedure well. Chest X-ray ordered to verify placement.  CXR: pending.  Nelda BucksFEINSTEIN,DANIEL J. 11-11-2013, 9:05 AM  US  Mcarthur Rossettianiel J. Tyson AliasFeinstein, MD, FACP Pgr: [phone] [facility]

## 2013-12-20 NOTE — Consult Note (Addendum)
NEURO HOSPITALIST CONSULT NOTE    Reason for Consult: AMS  HPI:                                                                                                                                         Most of history obtained from chart   [name] is an 48 y.o. male male with extensive hx including developmental delay, institutionalization, COPD with ongoing tobacco abuse, OSA, spasticity with contractures with know cervical and lumbar disease, recurrent falls secondary to that, recurrent CO2 narcosis with narcotics, ? Primary hypersomnia, hx vert fx, Hypernatremia ?? Etiology. Was hosp 7/25-8/3 with Neuo and NS input. Did get MRI with IV contrast, and D/C on tid Ibuprofen. MRI did show large bladder, not addressed. Cr was approx 1 @ d/c and now 7.6. Apparently less responsive this am, [facility] then here because of hypotension and probable septic schock. Neurology was called for AMS and possible hypoxic brain injury.   Currently he is on 150 mcg/hr fentanyl, and not responsive to pain or external stimuli.    Past Medical History  Diagnosis Date  . Acute respiratory failure   . Hypoxemia   . Encephalopathy, unspecified   . Pain in limb   . Hyperosmolality and/or hypernatremia   . Dehydration   . Altered mental status   . Other specific developmental learning difficulties   . Acidosis   . Gallstone ileus   . Unspecified vitamin B deficiency   . Tobacco use disorder   . Acute conjunctivitis, unspecified   . Other chronic allergic conjunctivitis   . Unspecified essential hypertension   . Cellulitis and abscess of unspecified site   . Muscle weakness (generalized)   . Cognitive communication deficit   . COPD (chronic obstructive pulmonary disease)   . Sleep apnea   . 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 Acute respiratory failure with hypoxia 09/20/2012  . Acute respiratory acidosis 09/20/2012  . Cellulitis of left lower extremity 09/20/2012  . OSA  (obstructive sleep apnea) 09/20/2012  . Lumbosacral spondylosis without myelopathy 12/03/2013  . Degenerative joint disease of spine 12/03/2013  . Cervical spondylosis with myelopathy 12/03/2013    Past Surgical History  Procedure Laterality Date  . Eye surgery      cataracts  . Radiology with anesthesia N/A 12/09/2013    Procedure: MRI OF LUMBAR W/WO CONTRAST ;  Surgeon: Medication Radiologist, MD;  Location: [facility] OR;  Service: Radiology;  Laterality: N/A;    Family History  Problem Relation Age of Onset  . Lung cancer Father     deceased  . Diabetes Mother      Social History:  reports that he has been smoking.  He does not have any smokeless tobacco history on file. He reports that he does not  drink alcohol or use illicit drugs.  No Known Allergies  MEDICATIONS:                                                                                                                     Prior to Admission:  Prescriptions prior to admission  Medication Sig Dispense Refill  . albuterol (PROVENTIL) (5 MG/ML) 0.5% nebulizer solution Take 0.5 mLs (2.5 mg total) by nebulization every 6 (six) hours as needed for wheezing.  20 mL  12  . baclofen (LIORESAL) 5 mg TABS tablet Take 0.5 tablets (5 mg total) by mouth 3 (three) times daily.  30 each  0  . budesonide (PULMICORT) 0.25 MG/2ML nebulizer solution Take 2 mLs (0.25 mg total) by nebulization 2 (two) times daily.  60 mL  12  . budesonide (PULMICORT) 180 MCG/ACT inhaler Inhale into the lungs 2 (two) times daily.      . cyclobenzaprine (FLEXERIL) 10 MG tablet Take 1 tablet (10 mg total) by mouth 2 (two) times daily as needed for muscle spasms.  10 tablet  0  . HYDROcodone-acetaminophen (NORCO/VICODIN) 5-325 MG per tablet Take 1 tablet by mouth every 4 (four) hours as needed for moderate pain.  20 tablet  0  . ibuprofen (ADVIL,MOTRIN) 800 MG tablet Take 800 mg by mouth every 8 (eight) hours as needed.       Scheduled: . antiseptic oral rinse  7 mL Mouth  Rinse QID  . chlorhexidine  15 mL Mouth Rinse BID  . famotidine (PEPCID) IV  20 mg Intravenous Q12H  . heparin  5,000 Units Subcutaneous 3 times per day  . insulin aspart  2-6 Units Subcutaneous 6 times per day  . ipratropium-albuterol  3 mL Nebulization Q6H  . vancomycin  500 mg Oral 4 times per day   And  . metronidazole  500 mg Intravenous Q8H  . piperacillin-tazobactam (ZOSYN)  IV  2.25 g Intravenous Q6H  . piperacillin-tazobactam (ZOSYN)  IV  3.375 g Intravenous 3 times per day  . sodium chloride  3 mL Intravenous Q12H  . [START ON 12/21/2013] vancomycin  1,000 mg Intravenous Q24H     ROS:                                                                                                                                       History obtained from unobtainable from patient due to mental status  General  ROS: negative for - chills, fatigue, fever, night sweats, weight gain or weight loss Psychological ROS: negative for - behavioral disorder, hallucinations, memory difficulties, mood swings or suicidal ideation Ophthalmic ROS: negative for - blurry vision, double vision, eye pain or loss of vision ENT ROS: negative for - epistaxis, nasal discharge, oral lesions, sore throat, tinnitus or vertigo Allergy and Immunology ROS: negative for - hives or itchy/watery eyes Hematological and Lymphatic ROS: negative for - bleeding problems, bruising or swollen lymph nodes Endocrine ROS: negative for - galactorrhea, hair pattern changes, polydipsia/polyuria or temperature intolerance Respiratory ROS: negative for - cough, hemoptysis, shortness of breath or wheezing Cardiovascular ROS: negative for - chest pain, dyspnea on exertion, edema or irregular heartbeat Gastrointestinal ROS: negative for - abdominal pain, diarrhea, hematemesis, nausea/vomiting or stool incontinence Genito-Urinary ROS: negative for - dysuria, hematuria, incontinence or urinary frequency/urgency Musculoskeletal ROS: negative for -  joint swelling or muscular weakness Neurological ROS: as noted in HPI Dermatological ROS: negative for rash and skin lesion changes   Blood pressure 89/60, pulse 106, temperature 97.5 F (36.4 C), temperature source Oral, resp. rate 27, height 5' 1.81" (1.57 m), weight 86.8 kg (191 lb 5.8 oz), SpO2 100.00%.   Neurologic Examination:                                                                                                      Mental Status: Patient does not respond to verbal stimuli.  Does not respond to deep sternal rub.  Does not follow commands.  No verbalizations noted.  Cranial Nerves: II: patient does not respond confrontation bilaterally, pupils right 1 mm, left 1 mm,and reactive bilaterally III,IV,VI: doll's response absent bilaterally.  V,VII: corneal reflex present bilaterally  VIII: patient does not respond to verbal stimuli IX,X: gag reflex reduced, XI: trapezius strength unable to test bilaterally XII: tongue strength unable to test Motor: Extremities flaccid throughout.  No spontaneous movement noted.  No purposeful movements noted. Sensory: Does not respond to noxious stimuli in any extremity. Deep Tendon Reflexes:  Absent throughout. Plantars: equivocal bilaterally Cerebellar: Unable to perform    Lab Results: Basic Metabolic Panel:  Recent Labs Lab 12/17/2013 0815  NA 146  K 4.5  CL 110  CO2 21  GLUCOSE 140*  BUN 86*  CREATININE 7.64*  CALCIUM 6.2*    Liver Function Tests:  Recent Labs Lab 12/28/2013 0815  AST 19  ALT 22  ALKPHOS 86  BILITOT <0.2*  PROT 4.5*  ALBUMIN 1.5*   No results found for this basename: LIPASE, AMYLASE,  in the last 168 hours No results found for this basename: AMMONIA,  in the last 168 hours  CBC:  Recent Labs Lab 12/17/2013 0815  WBC 6.5  NEUTROABS 5.3  HGB 8.2*  HCT 24.5*  MCV 96.8  PLT 106*    Cardiac Enzymes:  Recent Labs Lab 12/27/2013 0815  TROPONINI <0.30    Lipid Panel: No results found  for this basename: CHOL, TRIG, HDL, CHOLHDL, VLDL, LDLCALC,  in the last 168 hours  CBG: No results found for this basename: GLUCAP,  in the last 168 hours  Microbiology: Results for orders placed during the hospital encounter of 12/11/2013  MRSA PCR SCREENING     Status: None   Collection Time    12/10/2013  5:57 AM      Result Value Ref Range Status   MRSA by PCR NEGATIVE  NEGATIVE Final   Comment:            The GeneXpert MRSA Assay (FDA     approved for NASAL specimens     only), is one component of a     comprehensive MRSA colonization     surveillance program. It is not     intended to diagnose MRSA     infection nor to guide or     monitor treatment for     MRSA infections.  CLOSTRIDIUM DIFFICILE BY PCR     Status: None   Collection Time    12/09/2013  8:40 AM      Result Value Ref Range Status   C difficile by pcr NEGATIVE  NEGATIVE Final    Coagulation Studies:  Recent Labs  12/14/2013 0815  LABPROT 16.8*  INR 1.36    Imaging: Dg Chest Port 1 View  12/19/2013   CLINICAL DATA:  Status post endotracheal tube placement and right internal jugular catheter placement.  EXAM: PORTABLE CHEST - 1 VIEW  COMPARISON:  Portable chest x-ray of December 20, 2013.  FINDINGS: The endotracheal tube tip lies approximately 4 cm above the crotch of the carina. The right internal jugular venous catheter tip lies in the proximal portion of the SVC. There is no postprocedure pneumothorax. The lung volumes remain low. The perihilar lung markings are increased. Atelectasis at the left lung base persists. The cardiac silhouette is mildly enlarged.  IMPRESSION: Interval placement of an endotracheal tube and right internal jugular venous catheter without postprocedure complication.   Electronically Signed   By: David  [place]   On: 01/06/2014 09:05       Assessment and plan per attending neurologist  Felicie Morn PA-C [facility] [phone]  12/25/2013, 11:30  AM   Assessment/Plan: 48 YO with AMS in setting of UTI and possible sepsis.  Pupils are pinpoint but reactive.  Patient showed no response to narcan after administration. While hospitalized patient required pressors for hypotension and intubation for respiratory insufficiency. current exam shows intact corneal, pupillary,gag and spontaneous respirations. With these findings, cannot rule out possible brain stem infarct versus septic emboli.  Must also consider cerebral hypoxic injury.    Recommend: 1) CT head --if negative would order MRI brain 2) EEG Will follow up   Patient seen and examined together with physician assistant and I concur with the assessment and plan.  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  No electrographic seizures noted, but in this particular scenario of patient with critically ill will recommend starting anticonvulsant treatment due to the potential for clinical and electrographic seizures. Very poor renal function at this moment, thus will load with i gram IV Depacon and daily maintenance dose 500 mg IV TID. VPA level in am.  Wyatt Portela ,MD

## 2013-12-20 NOTE — Progress Notes (Signed)
Utilization review completed. Demond Shallenberger, RN, BSN. 

## 2013-12-20 NOTE — Procedures (Addendum)
EEG report.  Brief clinical history:  48 YO with AMS in setting of UTI and possible sepsis. Pupils are pinpoint but reactive. Patient showed no response to narcan after administration. While hospitalized patient required pressors for hypotension and intubation for respiratory insufficiency.  Technique: this is a 17 channel routine scalp EEG performed at the bedside in the ICU setting, with bipolar and monopolar montages arranged in accordance to the international 10/20 system of electrode placement. One channel was dedicated to EKG recording.  The patient is intubated on the vent, sedated with fentanyl. No activating procedures performed.  Description: as the study begins and throughout the entire recording, there is continuous generalized non reactive theta slowing alternating with brief periods of background suppression and infrequent intermixed sharp waves involving the left grater than right temporal regions. Phase reversals at T3 noted. No electrographic seizures noted.    EKG showed sinus rhythm.  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 No electrographic seizures noted. Clinical correlation is advised.   Wyatt Portelasvaldo Camilo, MD [facility]

## 2013-12-20 NOTE — Progress Notes (Signed)
eLink Physician-Brief Progress Note Patient Name: Joshua BishopMichael D Cummings DOB: 14-Sep-1965 MRN: [id number]   Date of Service  Aug 18, 2013  HPI/Events of Note  Pt's family arrived.   eICU Interventions  Provide update about pt's current status and treatment plan.      Intervention Category Major Interventions: Other:  [name] Aug 18, 2013, 8:10 PM

## 2013-12-21 ENCOUNTER — Inpatient Hospital Stay (HOSPITAL_COMMUNITY): Payer: Medicaid Other

## 2013-12-21 DIAGNOSIS — R579 Shock, unspecified: Secondary | ICD-10-CM | POA: Diagnosis present

## 2013-12-21 DIAGNOSIS — G9340 Encephalopathy, unspecified: Secondary | ICD-10-CM | POA: Diagnosis present

## 2013-12-21 DIAGNOSIS — R569 Unspecified convulsions: Secondary | ICD-10-CM | POA: Diagnosis present

## 2013-12-21 LAB — POCT ACTIVATED CLOTTING TIME
ACTIVATED CLOTTING TIME: 191 s
ACTIVATED CLOTTING TIME: 180 s
ACTIVATED CLOTTING TIME: 191 s
ACTIVATED CLOTTING TIME: 191 s
Activated Clotting Time: 157 seconds
Activated Clotting Time: 174 seconds
Activated Clotting Time: 191 seconds
Activated Clotting Time: 202 seconds
Activated Clotting Time: 191 seconds
Activated Clotting Time: 180 seconds
Activated Clotting Time: 180 seconds
Activated Clotting Time: 185 seconds
Activated Clotting Time: 185 seconds
Activated Clotting Time: 191 seconds
Activated Clotting Time: 174 seconds
Activated Clotting Time: 191 seconds
Activated Clotting Time: 197 seconds
Activated Clotting Time: 191 seconds

## 2013-12-21 LAB — BLOOD GAS, ARTERIAL
Acid-Base Excess: 0.3 mmol/L (ref 0.0–2.0)
Acid-base deficit: 3.1 mmol/L — ABNORMAL HIGH (ref 0.0–2.0)
Bicarbonate: 21.9 mEq/L (ref 20.0–24.0)
Bicarbonate: 25.2 mEq/L — ABNORMAL HIGH (ref 20.0–24.0)
DRAWN BY: 398991
Drawn by: 252031
FIO2: 0.4 %
FIO2: 0.4 %
O2 Saturation: 99.1 %
O2 Saturation: 99 %
PATIENT TEMPERATURE: 97.8
PATIENT TEMPERATURE: 97.9
PCO2 ART: 41.2 mmHg (ref 35.0–45.0)
PCO2 ART: 45.7 mmHg — AB (ref 35.0–45.0)
PEEP: 5 cmH2O
PEEP: 5 cmH2O
PH ART: 7.342 — AB (ref 7.350–7.450)
PRESSURE CONTROL: 10 cmH2O
RATE: 24 resp/min
RATE: 14 resp/min
TCO2: 23.2 mmol/L (ref 0–100)
TCO2: 26.6 mmol/L (ref 0–100)
VT: 450 mL
pH, Arterial: 7.357 (ref 7.350–7.450)
pO2, Arterial: 181 mmHg — ABNORMAL HIGH (ref 80.0–100.0)
pO2, Arterial: 190 mmHg — ABNORMAL HIGH (ref 80.0–100.0)

## 2013-12-21 LAB — COMPREHENSIVE METABOLIC PANEL
ALT: 34 U/L (ref 0–53)
AST: 30 U/L (ref 0–37)
Albumin: 1.8 g/dL — ABNORMAL LOW (ref 3.5–5.2)
Alkaline Phosphatase: 164 U/L — ABNORMAL HIGH (ref 39–117)
Anion gap: 15 (ref 5–15)
BILIRUBIN TOTAL: 0.4 mg/dL (ref 0.3–1.2)
BUN: 40 mg/dL — AB (ref 6–23)
CALCIUM: 7.8 mg/dL — AB (ref 8.4–10.5)
CO2: 21 meq/L (ref 19–32)
CREATININE: 3.86 mg/dL — AB (ref 0.50–1.35)
Chloride: 104 mEq/L (ref 96–112)
GFR calc non Af Amer: 17 mL/min — ABNORMAL LOW (ref >90)
GFR, EST AFRICAN AMERICAN: 20 mL/min — AB (ref >90)
Glucose, Bld: 106 mg/dL — ABNORMAL HIGH (ref 70–99)
Potassium: 5 mEq/L (ref 3.7–5.3)
Sodium: 140 mEq/L (ref 137–147)
Total Protein: 5.7 g/dL — ABNORMAL LOW (ref 6.0–8.3)

## 2013-12-21 LAB — GLUCOSE, CAPILLARY
GLUCOSE-CAPILLARY: 107 mg/dL — AB (ref 70–99)
GLUCOSE-CAPILLARY: 59 mg/dL — AB (ref 70–99)
GLUCOSE-CAPILLARY: 74 mg/dL (ref 70–99)
GLUCOSE-CAPILLARY: 75 mg/dL (ref 70–99)
Glucose-Capillary: 107 mg/dL — ABNORMAL HIGH (ref 70–99)
Glucose-Capillary: 120 mg/dL — ABNORMAL HIGH (ref 70–99)
Glucose-Capillary: 93 mg/dL (ref 70–99)

## 2013-12-21 LAB — MAGNESIUM: Magnesium: 2 mg/dL (ref 1.5–2.5)

## 2013-12-21 LAB — RENAL FUNCTION PANEL
Albumin: 1.8 g/dL — ABNORMAL LOW (ref 3.5–5.2)
Anion gap: 10 (ref 5–15)
BUN: 26 mg/dL — ABNORMAL HIGH (ref 6–23)
CHLORIDE: 103 meq/L (ref 96–112)
CO2: 25 meq/L (ref 19–32)
CREATININE: 2.89 mg/dL — AB (ref 0.50–1.35)
Calcium: 7.9 mg/dL — ABNORMAL LOW (ref 8.4–10.5)
GFR calc Af Amer: 28 mL/min — ABNORMAL LOW (ref >90)
GFR calc non Af Amer: 24 mL/min — ABNORMAL LOW (ref >90)
GLUCOSE: 102 mg/dL — AB (ref 70–99)
Phosphorus: 3.7 mg/dL (ref 2.3–4.6)
Potassium: 4.8 mEq/L (ref 3.7–5.3)
Sodium: 138 mEq/L (ref 137–147)

## 2013-12-21 LAB — CBC
HCT: 29.2 % — ABNORMAL LOW (ref 39.0–52.0)
Hemoglobin: 9.6 g/dL — ABNORMAL LOW (ref 13.0–17.0)
MCH: 32.1 pg (ref 26.0–34.0)
MCHC: 32.9 g/dL (ref 30.0–36.0)
MCV: 97.7 fL (ref 78.0–100.0)
PLATELETS: 103 10*3/uL — AB (ref 150–400)
RBC: 2.99 MIL/uL — ABNORMAL LOW (ref 4.22–5.81)
RDW: 16 % — ABNORMAL HIGH (ref 11.5–15.5)
WBC: 7.7 10*3/uL (ref 4.0–10.5)

## 2013-12-21 LAB — PHOSPHORUS: PHOSPHORUS: 3.6 mg/dL (ref 2.3–4.6)

## 2013-12-21 LAB — VALPROIC ACID LEVEL: VALPROIC ACID LVL: 24 ug/mL — AB (ref 50.0–100.0)

## 2013-12-21 LAB — APTT: aPTT: 41 seconds — ABNORMAL HIGH (ref 24–37)

## 2013-12-21 MED ORDER — NITROFURANTOIN 25 MG/5ML PO SUSP
Freq: Once | ORAL | Status: DC
  Filled 2013-12-21: qty 90

## 2013-12-21 MED ORDER — DEXTROSE 50 % IV SOLN
INTRAVENOUS | Status: AC
  Administered 2013-12-21: 25 mL
  Filled 2013-12-21: qty 50

## 2013-12-21 MED ORDER — NEPRO/CARBSTEADY PO LIQD
1000.0000 mL | ORAL | Status: DC
  Administered 2013-12-21 – 2013-12-24 (×3): 1000 mL
  Filled 2013-12-21 (×5): qty 1000

## 2013-12-21 MED ORDER — FAMOTIDINE 40 MG/5ML PO SUSR
20.0000 mg | Freq: Every day | ORAL | Status: DC
  Administered 2013-12-21 – 2013-12-23 (×3): 20 mg
  Filled 2013-12-21 (×3): qty 2.5

## 2013-12-21 MED ORDER — METOPROLOL TARTRATE 1 MG/ML IV SOLN: 2.5000 mg | INTRAVENOUS | Status: DC | PRN

## 2013-12-21 MED ORDER — SODIUM CHLORIDE 0.9 % IR SOLN
Freq: Once | Status: AC
  Administered 2013-12-21: 11:00:00
  Filled 2013-12-21 (×8): qty 1

## 2013-12-21 MED ORDER — SODIUM CHLORIDE 0.9 % IV SOLN
INTRAVENOUS | Status: DC
  Administered 2013-12-20 – 2013-12-27 (×4): via INTRAVENOUS

## 2013-12-21 MED ORDER — LORAZEPAM 2 MG/ML IJ SOLN: 2.0000 mg | INTRAMUSCULAR | Status: DC | PRN

## 2013-12-21 MED ORDER — DEXMEDETOMIDINE HCL IN NACL 200 MCG/50ML IV SOLN
0.1000 ug/kg/h | INTRAVENOUS | Status: DC
  Administered 2013-12-21: 0.3 ug/kg/h via INTRAVENOUS
  Administered 2013-12-22: 0.2 ug/kg/h via INTRAVENOUS
  Filled 2013-12-21 (×3): qty 50

## 2013-12-21 MED ORDER — MIDAZOLAM HCL 2 MG/2ML IJ SOLN
1.0000 mg | INTRAMUSCULAR | Status: DC | PRN
  Administered 2013-12-22: 1 mg via INTRAVENOUS
  Filled 2013-12-21: qty 2

## 2013-12-21 MED ORDER — FENTANYL CITRATE 0.05 MG/ML IJ SOLN
25.0000 ug | INTRAMUSCULAR | Status: DC | PRN
  Administered 2013-12-22 – 2013-12-23 (×2): 100 ug via INTRAVENOUS
  Administered 2013-12-25: 25 ug via INTRAVENOUS
  Administered 2013-12-25 (×2): 100 ug via INTRAVENOUS
  Administered 2013-12-25 – 2013-12-26 (×3): 50 ug via INTRAVENOUS
  Filled 2013-12-21 (×8): qty 2

## 2013-12-21 NOTE — Progress Notes (Addendum)
PULMONARY / CRITICAL CARE MEDICINE   Name: [name] MRN: [id number] DOB: 1966-04-30    ADMISSION DATE:  12/22/2013 CONSULTATION DATE:  12/30/2013  REFERRING MD :  Dr. Allena Katz   CHIEF COMPLAINT:  AKI, AMS, Acute Resp Fx  INITIAL PRESENTATION: 48 yr old male admitted pyelo, septic shock, ARF  STUDIES/SIGNIFICANT EVENTS:  8/14 Admit to [facility] from [place], SNF resident with fever, AMS, hypotension. Intubated for airway protection. Vasopressors initiated 8/14 CT ABD/Pelvis ([facility]):  no hydro, concern for L pyelo 8/14 CT Head/Neck [facility]):  no acute intracranial abnormalities, no acute injury to cervical spine 8/14 CT head: NAICP 8/14 Neuro consult: MRI, EEG ordered 8/14 Renal consult: CRRT initiated 8/14 EEG: diffuse slowing consistent with non specific moderate encephalopathy. 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 No electrographic seizures noted. 8/14 Valproic acid initiated 8/15 Vasopressors weaned to off.  8/15 MRI brain:     SUBJECTIVE:  RASS -3. Not F/C. Weaned off vasopressors. Tolerates PS 5 cm H2O but poor cognition prohibits extubation  VITAL SIGNS: Temp:  [98.5 F (36.9 C)-103.4 F (39.7 C)] 98.7 F (37.1 C) (08/15 0800) Pulse Rate:  [98-137] 130 (08/15 0900) Resp:  [11-30] 15 (08/15 0900) BP: (66-128)/(30-90) 104/58 mmHg (08/15 0900) SpO2:  [50 %-100 %] 100 % (08/15 0900) Arterial Line BP: (81-176)/(34-95) 121/55 mmHg (08/15 0900) FiO2 (%):  [40 %-60 %] 40 % (08/15 0800) Weight:  [85 kg (187 lb 6.3 oz)] 85 kg (187 lb 6.3 oz) (08/15 0500)  HEMODYNAMICS: CVP:  [10 mmHg-15 mmHg] 12 mmHg  VENTILATOR SETTINGS: Vent Mode:  [-] PRVC FiO2 (%):  [40 %-60 %] 40 % Set Rate:  [24 bmp] 24 bmp Vt Set:  [400 mL] 400 mL PEEP:  [5 cmH20] 5 cmH20 Plateau Pressure:  [16 cmH20-24 cmH20] 24 cmH20  INTAKE / OUTPUT:  Intake/Output Summary (Last 24 hours) at  12/21/13 0905 Last data filed at 12/21/13 0800  Gross per 24 hour  Intake 4415.4 ml  Output   3846 ml  Net  569.4 ml    PHYSICAL EXAMINATION: General:   Neuro:  RASS -3 to -4, pupils pinpoint but reactive, increased tone throughout, 3 beat R ankle clonus, 2 beat L ankle clonus HEENT:  NCAT, WNL Cardiovascular:  Tachy, reg, no M Lungs: clear anteriorly Abdomen:  Obese, soft, bsx4 active  Ext: no edema, warm  LABS: I have reviewed all of today's lab results. Relevant abnormalities are discussed in the A/P section  CXR: low volumes, crowding of markings   ASSESSMENT / PLAN:  PULMONARY ETT 8/14 >>  A: Acute Respiratory Failure due to AMS Smoker, H/O COPD without acute bronchospasm P:   Cont vent support - settings reviewed and/or adjusted Wean in PS mode as tolerated  Cont vent bundle Daily SBT if/when meets criteria  CARDIOVASCULAR CVL R IJ HD 8/14 >>  R fem art line 8/14 >>  A:  Shock, suspect septic - in setting of UTI/Pyelo Bradycardia, resolved Sinus tachycardia, likely reactive  P:  Resume vasopressors as needed to maintain MAP > 60 mmHg PRN metoprolol to maintain HR < 115/min  RENAL A:   AKI, anuric P:   CRRT per renal service Monitor BMET intermittently Monitor I/Os Correct electrolytes as indicated  GASTROINTESTINAL A:   Diarrhea P:   Assess C-Diff PCR NPO OGT, consider feeding in am 8/15 - vital SUP: Pepcid given concerns for C-Diff LFT now  HEMATOLOGIC A:   Anemia  without evidence of acute blood loss Thrombocytopenia P:  DVT px: SCDs DC SQ heparin Monitor CBC intermittently Transfuse per usual ICU guidelines   INFECTIOUS A:   Severe sepsis Diarrhea, C diff negative Pyuria P:   Blood 8/14 >> Urine 8/14 >> Resp 8/14 >>   Vanc 8/14 >> 8/15 Pip-tazo 8/14 >>   ENDOCRINE A:   Hyperglycemia, resolved.  P:   DC SSI Change CBGs to q 8 hrs  NEUROLOGIC A:   Acute Encephalopathy  Evidence of seizure P:   RASS goal:  -1 DC continuous sedatives Dexmedetomidine infusion initiated 8/15 PRN fent PRN midaz MRI brain ordered Further eval per Neuro VPA initiated 8/14 PRN lorazepam for breakthrough seizures     I have personally obtained a history, examined the patient, evaluated laboratory and imaging results, formulated the assessment and plan and placed orders.  CRITICAL CARE: The patient is critically ill with multiple organ systems failure and requires high complexity decision making for assessment and support, frequent evaluation and titration of therapies, application of advanced monitoring technologies and extensive interpretation of multiple databases. Critical Care Time devoted to patient care services described in this note is 45 minutes.   Billy Fischeravid Claxton Levitz, MD ; [facility] service Mobile [phone].  After 5:30 PM or weekends, call [phone]   12/21/2013, 9:04 AM

## 2013-12-21 NOTE — Progress Notes (Signed)
ETT pulled back 3cm per MD.  No complications at this time.  RT will continue to monitor.

## 2013-12-21 NOTE — Progress Notes (Signed)
Bladder irrigation with Polymycin B 500,000 units/bacitracin 50,000 unit in 500cc X 3 liters per order Dr. Darrick Pennaeterding.  Patient tolerated well.  Total amount of fluid infused returned clear via foley. Patient tolerated without complications.

## 2013-12-21 NOTE — Progress Notes (Signed)
BS 59.  25 ml of D50W per hypoglycemia protocol.

## 2013-12-21 NOTE — Progress Notes (Signed)
Subjective: Interval History: pos EEG ? SZ.  Nonresponsive, stacks breaths with stimulation.  Objective: Vital signs in last 24 hours: Temp:  [98.5 F (36.9 C)-103.4 F (39.7 C)] 98.7 F (37.1 C) (08/15 0400) Pulse Rate:  [57-137] 111 (08/15 0726) Resp:  [11-30] 30 (08/15 0726) BP: (66-155)/(30-98) 127/63 mmHg (08/15 0726) SpO2:  [50 %-100 %] 100 % (08/15 0736) Arterial Line BP: (81-177)/(34-95) 115/57 mmHg (08/15 0700) FiO2 (%):  [40 %-100 %] 40 % (08/15 0736) Weight:  [85 kg (187 lb 6.3 oz)] 85 kg (187 lb 6.3 oz) (08/15 0500) Weight change: -1.8 kg (-3 lb 15.5 oz)  Intake/Output from previous day: 08/14 0701 - 08/15 0700 In: 7162.8 [I.V.:3392.8; NG/GT:160; IV Piggyback:3465] Out: 3691 [Urine:365; Emesis/NG output:600; Stool:150] Intake/Output this shift:    General appearance: nonresponsive on vent. Resp: rhonchi bilaterally Cardio: S1, S2 normal and systolic murmur: holosystolic 2/6, blowing at apex GI: soft, non-tender; bowel sounds normal; no masses,  no organomegaly and bs present but decreased.  liver down 5 cm, soft Extremities: edema Tr Neurologic: Reflexes: 2+ and symmetric clonus bilat in ankles, pos corneals, no response to pain,  but stacks breaths  Lab Results:  Recent Labs  12/24/2013 0815 12/21/13 0400  WBC 6.5 7.7  HGB 8.2* 9.6*  HCT 24.5* 29.2*  PLT 106* 103*   BMET:  Recent Labs  01/06/2014 1611 12/21/13 0400  NA 147 140  K 5.1 5.0  CL 111 104  CO2 18* 21  GLUCOSE 80 106*  BUN 81* 40*  CREATININE 7.58* 3.86*  CALCIUM 6.7* 7.8*   No results found for this basename: PTH,  in the last 72 hours Iron Studies: No results found for this basename: IRON, TIBC, TRANSFERRIN, FERRITIN,  in the last 72 hours  Studies/Results: Ct Head Wo Contrast  12/23/2013   CLINICAL DATA:  Altered mental status, possible CVA  EXAM: CT HEAD WITHOUT CONTRAST  TECHNIQUE: Contiguous axial images were obtained from the base of the skull through the vertex without  intravenous contrast.  COMPARISON:  01/03/2014  FINDINGS: No skull fracture is noted. There is mucosal thickening bilateral ethmoid air cells. Mild mucosal thickening bilateral frontal sinus. The frontal sinuses are hypoplastic. Mild mucosal thickening maxillary sinuses.  The mastoid air cells are unremarkable. No intracranial hemorrhage, mass effect or midline shift. No acute infarction. No mass lesion is noted on this unenhanced scan. Ventricular size is stable from prior exam.  IMPRESSION: No acute intracranial abnormality. Paranasal sinuses disease as described above.   Electronically Signed   By: Natasha MeadLiviu  Pop M.D.   On: 12/19/2013 12:54   [place] Renal  01/01/2014   CLINICAL DATA:  Acute renal insufficiency.  EXAM: RENAL/URINARY TRACT ULTRASOUND COMPLETE  COMPARISON:  CT scan 12/22/2013  FINDINGS: Right Kidney:  Length: 11.1 cm. Normal renal cortical thickness and echogenicity. The contour is very irregular. This could be congenital fetal lobulation or scarring changes related to previous infection. No hydronephrosis. No renal mass.  Left Kidney:  Length: 10.7 cm. Normal renal cortical thickness and echogenicity without focal lesions or hydronephrosis.  Bladder:  There is a Foley catheter in the decompressed bladder.  IMPRESSION: Normal renal cortical thickness, size and echogenicity without focal lesions or hydronephrosis.  Abnormal right renal contour could be due to congenital fetal lobulation or scarring.   Electronically Signed   By: Loralie ChampagneMark  Gallerani M.D.   On: 12/31/2013 14:40   Dg Chest Port 1 View  12/10/2013   CLINICAL DATA:  Status post endotracheal tube placement and right  internal jugular catheter placement.  EXAM: PORTABLE CHEST - 1 VIEW  COMPARISON:  Portable chest x-ray of December 20, 2013.  FINDINGS: The endotracheal tube tip lies approximately 4 cm above the crotch of the carina. The right internal jugular venous catheter tip lies in the proximal portion of the SVC. There is no postprocedure  pneumothorax. The lung volumes remain low. The perihilar lung markings are increased. Atelectasis at the left lung base persists. The cardiac silhouette is mildly enlarged.  IMPRESSION: Interval placement of an endotracheal tube and right internal jugular venous catheter without postprocedure complication.   Electronically Signed   By: David  [place]   On: 12/11/2013 09:05   Dg Abd Portable 1v  12/25/2013   CLINICAL DATA:  Sepsis.  EXAM: PORTABLE ABDOMEN - 1 VIEW  COMPARISON:  CT abdomen and pelvis 12/09/2013.  FINDINGS: The bowel gas pattern is unremarkable. No abnormal abdominal calcification or focal bony abnormality is seen. NG tube is in place with the tip just within the stomach. The tube could be advanced 3-4 cm.  IMPRESSION: No acute finding.  NG tube tip is just within the stomach. The tube could be advanced 3-4 cm.   Electronically Signed   By: Drusilla Kanner M.D.   On: 12/24/2013 15:09    I have reviewed the patient's current medications.  Assessment/Plan: 1 AKI  Sepsis, obstruction.  Improving solute and acid/base.  K ok. 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

## 2013-12-21 NOTE — Progress Notes (Signed)
  Nutrition Brief Note   Pt with high NG tube output. Spoke with RN who confirmed that MD wants trickle feeds at this time.   Nepro Carb Steady @ 20 ml/hr ordered by MD.   TF regimen provides 864 kcal, 39 g protein, and 350 ml of fluid.   RD will continue to monitor for nutrition care plan.   Ebbie LatusHaley Hawkins RD, LDN

## 2013-12-21 NOTE — Progress Notes (Signed)
NEURO HOSPITALIST PROGRESS NOTE   SUBJECTIVE:                                                                                                                        Off sedatives, intubated on the vent. He open eyes to verbal stimuli but doesn't follow commands. Generalized seizure-like activity noted earlier today. On IV Depacon 500 mg TID MRI pending. EEG 8/14: no electrographic seizures but continuous generalized non reactive theta slowing alternating with brief periods of background suppression and infrequent intermixed sharp waves involving the left grater than right temporal regions. Getting dialysis.   OBJECTIVE:                                                                                                                           Vital signs in last 24 hours: Temp:  [98.5 F (36.9 C)-103.4 F (39.7 C)] 98.7 F (37.1 C) (08/15 0800) Pulse Rate:  [98-137] 118 (08/15 1000) Resp:  [9-30] 9 (08/15 1000) BP: (72-128)/(35-90) 96/57 mmHg (08/15 1000) SpO2:  [50 %-100 %] 100 % (08/15 1000) Arterial Line BP: (90-176)/(39-95) 116/57 mmHg (08/15 1000) FiO2 (%):  [40 %-60 %] 40 % (08/15 0909) Weight:  [85 kg (187 lb 6.3 oz)] 85 kg (187 lb 6.3 oz) (08/15 0500)  Intake/Output from previous day: 08/14 0701 - 08/15 0700 In: 7162.8 [I.V.:3392.8; NG/GT:160; IV Piggyback:3465] Out: 3691 [Urine:365; Emesis/NG output:600; Stool:150] Intake/Output this shift: Total I/O In: 91.3 [I.V.:41.3; IV Piggyback:50] Out: 348 [Other:348] Nutritional status:    Past Medical History  Diagnosis Date  . Acute respiratory failure   . Hypoxemia   . Encephalopathy, unspecified   . Pain in limb   . Hyperosmolality and/or hypernatremia   . Dehydration   . Altered mental status   . Other specific developmental learning difficulties   . Acidosis   . Gallstone ileus   . Unspecified vitamin B deficiency   . Tobacco use disorder   . Acute conjunctivitis,  unspecified   . Other chronic allergic conjunctivitis   . Unspecified essential hypertension   . Cellulitis and abscess of unspecified site   . Muscle weakness (generalized)   . Cognitive communication deficit   . COPD (chronic obstructive pulmonary disease)   . Sleep  apnea   . 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 Acute respiratory failure with hypoxia 09/20/2012  . Acute respiratory acidosis 09/20/2012  . Cellulitis of left lower extremity 09/20/2012  . OSA (obstructive sleep apnea) 09/20/2012  . Lumbosacral spondylosis without myelopathy 12/03/2013  . Degenerative joint disease of spine 12/03/2013  . Cervical spondylosis with myelopathy 12/03/2013   Neurologic Exam:  Mental status: open eyes to verbal stimuli but doesn't follow commands. CN 2-12: small pupils reactive to light. No gaze preference. Blinks to threat in the right but no response in the left. Face seems symmetric. Tongue: intubated. Motor: no spontaneous movements. Sensory: does not react to pain. DTR's: 1+ all over. Plantars: mute Coordination and gait: unable to test.  Lab Results: No results found for this basename: cbc, bmp, coags, chol, tri, ldl, hga1c   Lipid Panel No results found for this basename: CHOL, TRIG, HDL, CHOLHDL, VLDL, LDLCALC,  in the last 72 hours  Studies/Results: Ct Head Wo Contrast  12/19/2013   CLINICAL DATA:  Altered mental status, possible CVA  EXAM: CT HEAD WITHOUT CONTRAST  TECHNIQUE: Contiguous axial images were obtained from the base of the skull through the vertex without intravenous contrast.  COMPARISON:  12/19/2013  FINDINGS: No skull fracture is noted. There is mucosal thickening bilateral ethmoid air cells. Mild mucosal thickening bilateral frontal sinus. The frontal sinuses are hypoplastic. Mild mucosal thickening maxillary sinuses.  The mastoid air cells are unremarkable. No intracranial hemorrhage, mass effect or midline shift. No acute infarction. No mass lesion is noted on this  unenhanced scan. Ventricular size is stable from prior exam.  IMPRESSION: No acute intracranial abnormality. Paranasal sinuses disease as described above.   Electronically Signed   By: Natasha Mead M.D.   On: 01/06/2014 12:54   [place] Renal  01/03/2014   CLINICAL DATA:  Acute renal insufficiency.  EXAM: RENAL/URINARY TRACT ULTRASOUND COMPLETE  COMPARISON:  CT scan 12/31/2013  FINDINGS: Right Kidney:  Length: 11.1 cm. Normal renal cortical thickness and echogenicity. The contour is very irregular. This could be congenital fetal lobulation or scarring changes related to previous infection. No hydronephrosis. No renal mass.  Left Kidney:  Length: 10.7 cm. Normal renal cortical thickness and echogenicity without focal lesions or hydronephrosis.  Bladder:  There is a Foley catheter in the decompressed bladder.  IMPRESSION: Normal renal cortical thickness, size and echogenicity without focal lesions or hydronephrosis.  Abnormal right renal contour could be due to congenital fetal lobulation or scarring.   Electronically Signed   By: Loralie Champagne M.D.   On: 12/29/2013 14:40   Dg Chest Port 1 View  12/21/2013   CLINICAL DATA:  ET tube placement.  EXAM: PORTABLE CHEST - 1 VIEW  COMPARISON:  01/03/2014.  FINDINGS: The endotracheal tube has advanced slightly since the prior radiograph and now lies at the carina. It should be pulled back 3-4 cm. Low lung volumes persist with bibasilar atelectasis. Mild vascular congestion.  IMPRESSION: ET tube too low, with tip at carina. Pull back 3-4 cm. Low lung volumes persist with mild vascular congestion.   Electronically Signed   By: Davonna Belling M.D.   On: 12/21/2013 08:02   Dg Chest Port 1 View  12/31/2013   CLINICAL DATA:  Status post endotracheal tube placement and right internal jugular catheter placement.  EXAM: PORTABLE CHEST - 1 VIEW  COMPARISON:  Portable chest x-ray of December 20, 2013.  FINDINGS: The endotracheal tube tip lies approximately 4 cm above the crotch of the  carina. The  right internal jugular venous catheter tip lies in the proximal portion of the SVC. There is no postprocedure pneumothorax. The lung volumes remain low. The perihilar lung markings are increased. Atelectasis at the left lung base persists. The cardiac silhouette is mildly enlarged.  IMPRESSION: Interval placement of an endotracheal tube and right internal jugular venous catheter without postprocedure complication.   Electronically Signed   By: David  [place]   On: 12/23/2013 09:05   Dg Abd Portable 1v  12/30/2013   CLINICAL DATA:  Sepsis.  EXAM: PORTABLE ABDOMEN - 1 VIEW  COMPARISON:  CT abdomen and pelvis 01/06/2014.  FINDINGS: The bowel gas pattern is unremarkable. No abnormal abdominal calcification or focal bony abnormality is seen. NG tube is in place with the tip just within the stomach. The tube could be advanced 3-4 cm.  IMPRESSION: No acute finding.  NG tube tip is just within the stomach. The tube could be advanced 3-4 cm.   Electronically Signed   By: Drusilla Kanner M.D.   On: 12/19/2013 15:09    MEDICATIONS                                                                                                                        Scheduled: . antiseptic oral rinse  7 mL Mouth Rinse QID  . chlorhexidine  15 mL Mouth Rinse BID  . famotidine  20 mg Per Tube Daily  . heparin  5,000 Units Subcutaneous 3 times per day  . ipratropium-albuterol  3 mL Nebulization Q6H  . piperacillin-tazobactam (ZOSYN)  IV  2.25 g Intravenous Q6H  . sodium chloride  10-40 mL Intracatheter Q12H  . sodium chloride  3 mL Intravenous Q12H  . valproate sodium  500 mg Intravenous 3 times per day    ASSESSMENT/PLAN:                                                                                                           AMS in the context of toxic metabolic encephalopathy, can not ruled out brainstem infarct. Awaiting MRI brain. Seizure-like activity this morning: continue IV Depacon and obtain VPA level  before next dose.. Will follow up.  Wyatt Portela, MD [facility] [phone]  12/21/2013, 11:33 AM

## 2013-12-21 NOTE — Progress Notes (Signed)
Fentanyl 80 ml wasted in sink.  Witnessed per B. Pete GlatterHolley, RN

## 2013-12-21 NOTE — Progress Notes (Signed)
Patient weaned CPAP 5/5 for 3 hours.  Flipped to full support due to low VE alarm (<6.5).  Will continue to monitor.

## 2013-12-21 NOTE — Progress Notes (Signed)
CRRT interrupted for MRI-blood returned without incident.  Prepare for transport to MRI with RT on portable monitor, being bagged on 100% O2.

## 2013-12-22 ENCOUNTER — Inpatient Hospital Stay (HOSPITAL_COMMUNITY): Payer: Medicaid Other

## 2013-12-22 LAB — GLUCOSE, CAPILLARY
GLUCOSE-CAPILLARY: 96 mg/dL (ref 70–99)
GLUCOSE-CAPILLARY: 67 mg/dL — AB (ref 70–99)
Glucose-Capillary: 102 mg/dL — ABNORMAL HIGH (ref 70–99)
Glucose-Capillary: 105 mg/dL — ABNORMAL HIGH (ref 70–99)
Glucose-Capillary: 80 mg/dL (ref 70–99)

## 2013-12-22 LAB — POCT ACTIVATED CLOTTING TIME
ACTIVATED CLOTTING TIME: 197 s
ACTIVATED CLOTTING TIME: 185 s
ACTIVATED CLOTTING TIME: 180 s
ACTIVATED CLOTTING TIME: 180 s
ACTIVATED CLOTTING TIME: 174 s
ACTIVATED CLOTTING TIME: 191 s
Activated Clotting Time: 197 seconds
Activated Clotting Time: 185 seconds
Activated Clotting Time: 180 seconds
Activated Clotting Time: 191 seconds
Activated Clotting Time: 157 seconds
Activated Clotting Time: 197 seconds
Activated Clotting Time: 197 seconds

## 2013-12-22 LAB — RENAL FUNCTION PANEL
Albumin: 1.7 g/dL — ABNORMAL LOW (ref 3.5–5.2)
Anion gap: 8 (ref 5–15)
BUN: 14 mg/dL (ref 6–23)
CALCIUM: 8.1 mg/dL — AB (ref 8.4–10.5)
CO2: 27 meq/L (ref 19–32)
CREATININE: 1.59 mg/dL — AB (ref 0.50–1.35)
Chloride: 102 mEq/L (ref 96–112)
GFR calc Af Amer: 58 mL/min — ABNORMAL LOW (ref >90)
GFR calc non Af Amer: 50 mL/min — ABNORMAL LOW (ref >90)
Glucose, Bld: 88 mg/dL (ref 70–99)
PHOSPHORUS: 2 mg/dL — AB (ref 2.3–4.6)
Potassium: 4.3 mEq/L (ref 3.7–5.3)
SODIUM: 137 meq/L (ref 137–147)

## 2013-12-22 LAB — CBC
HCT: 27.5 % — ABNORMAL LOW (ref 39.0–52.0)
Hemoglobin: 8.9 g/dL — ABNORMAL LOW (ref 13.0–17.0)
MCH: 32.4 pg (ref 26.0–34.0)
MCHC: 32.4 g/dL (ref 30.0–36.0)
MCV: 100 fL (ref 78.0–100.0)
Platelets: 70 10*3/uL — ABNORMAL LOW (ref 150–400)
RBC: 2.75 MIL/uL — AB (ref 4.22–5.81)
RDW: 15.7 % — ABNORMAL HIGH (ref 11.5–15.5)
WBC: 5.9 10*3/uL (ref 4.0–10.5)

## 2013-12-22 LAB — URINE CULTURE: Colony Count: >=100000

## 2013-12-22 LAB — PROTIME-INR
INR: 1.24 (ref 0.00–1.49)
PROTHROMBIN TIME: 15.6 s — AB (ref 11.6–15.2)

## 2013-12-22 LAB — COMPREHENSIVE METABOLIC PANEL
ALT: 47 U/L (ref 0–53)
AST: 87 U/L — AB (ref 0–37)
Albumin: 1.5 g/dL — ABNORMAL LOW (ref 3.5–5.2)
Alkaline Phosphatase: 130 U/L — ABNORMAL HIGH (ref 39–117)
Anion gap: 9 (ref 5–15)
BUN: 17 mg/dL (ref 6–23)
CALCIUM: 7.9 mg/dL — AB (ref 8.4–10.5)
CO2: 24 meq/L (ref 19–32)
CREATININE: 1.94 mg/dL — AB (ref 0.50–1.35)
Chloride: 101 mEq/L (ref 96–112)
GFR calc Af Amer: 46 mL/min — ABNORMAL LOW (ref >90)
GFR calc non Af Amer: 39 mL/min — ABNORMAL LOW (ref >90)
Glucose, Bld: 116 mg/dL — ABNORMAL HIGH (ref 70–99)
Potassium: 4.2 mEq/L (ref 3.7–5.3)
Sodium: 134 mEq/L — ABNORMAL LOW (ref 137–147)
TOTAL PROTEIN: 5.1 g/dL — AB (ref 6.0–8.3)
Total Bilirubin: 0.4 mg/dL (ref 0.3–1.2)

## 2013-12-22 LAB — CULTURE, RESPIRATORY: Special Requests: NORMAL

## 2013-12-22 LAB — CULTURE, RESPIRATORY W GRAM STAIN

## 2013-12-22 LAB — APTT: APTT: 59 s — AB (ref 24–37)

## 2013-12-22 LAB — PHOSPHORUS: PHOSPHORUS: 2.9 mg/dL (ref 2.3–4.6)

## 2013-12-22 LAB — MAGNESIUM: MAGNESIUM: 2.3 mg/dL (ref 1.5–2.5)

## 2013-12-22 MED ORDER — DEXTROSE 5 % IV SOLN
750.0000 mg | Freq: Three times a day (TID) | INTRAVENOUS | Status: DC
  Administered 2013-12-22 – 2014-01-01 (×31): 750 mg via INTRAVENOUS
  Filled 2013-12-22 (×38): qty 7.5

## 2013-12-22 MED ORDER — PIPERACILLIN-TAZOBACTAM IN DEX 2-0.25 GM/50ML IV SOLN
2.2500 g | Freq: Four times a day (QID) | INTRAVENOUS | Status: AC
  Administered 2013-12-22 – 2013-12-27 (×18): 2.25 g via INTRAVENOUS
  Filled 2013-12-22 (×18): qty 50

## 2013-12-22 MED ORDER — VALPROATE SODIUM 500 MG/5ML IV SOLN
1.0000 g | Freq: Once | INTRAVENOUS | Status: AC
  Administered 2013-12-22: 1000 mg via INTRAVENOUS
  Filled 2013-12-22: qty 10

## 2013-12-22 MED ORDER — PIPERACILLIN-TAZOBACTAM IN DEX 2-0.25 GM/50ML IV SOLN: 2.2500 g | Freq: Four times a day (QID) | INTRAVENOUS | Status: DC

## 2013-12-22 MED ORDER — LORAZEPAM 2 MG/ML IJ SOLN
2.0000 mg | INTRAMUSCULAR | Status: DC | PRN
  Administered 2013-12-22 – 2013-12-23 (×3): 2 mg via INTRAVENOUS
  Filled 2013-12-22 (×3): qty 1

## 2013-12-22 NOTE — Progress Notes (Signed)
Patient agitated, purposeful movement-pulling at ET-Tube-eyes rolled upward, knees bent to chest.  Dr. Katrinka BlazingSmith notified of all parameters.  Orders rec'd.

## 2013-12-22 NOTE — Progress Notes (Addendum)
NEURO HOSPITALIST PROGRESS NOTE   SUBJECTIVE:                                                                                                                        Nurse report possible seizure this morning. He remains intubated on the vent, on very low dose precedex but off pressors. On IV Depacon 500 mg TID. VPA level 22 MRI brain showed no acute intracranial abnormality  OBJECTIVE:                                                                                                                           Vital signs in last 24 hours: Temp:  [97.5 F (36.4 C)-98.7 F (37.1 C)] 97.5 F (36.4 C) (08/16 0726) Pulse Rate:  [89-122] 120 (08/16 0900) Resp:  [9-23] 11 (08/16 0900) BP: (87-134)/(50-64) 129/64 mmHg (08/16 0734) SpO2:  [99 %-100 %] 100 % (08/16 0900) Arterial Line BP: (74-162)/(36-80) 129/67 mmHg (08/16 0900) FiO2 (%):  [40 %] 40 % (08/16 0800) Weight:  [83.9 kg (184 lb 15.5 oz)] 83.9 kg (184 lb 15.5 oz) (08/16 0400)  Intake/Output from previous day: 08/15 0701 - 08/16 0700 In: 1242.7 [I.V.:487.7; NG/GT:420; IV Piggyback:310] Out: 1961 [Urine:50; Emesis/NG output:400; Stool:25] Intake/Output this shift: Total I/O In: 66.4 [I.V.:26.4; NG/GT:40] Out: 149 [Urine:40; Other:109] Nutritional status:    Past Medical History  Diagnosis Date  . Acute respiratory failure   . Hypoxemia   . Encephalopathy, unspecified   . Pain in limb   . Hyperosmolality and/or hypernatremia   . Dehydration   . Altered mental status   . Other specific developmental learning difficulties   . Acidosis   . Gallstone ileus   . Unspecified vitamin B deficiency   . Tobacco use disorder   . Acute conjunctivitis, unspecified   . Other chronic allergic conjunctivitis   . Unspecified essential hypertension   . Cellulitis and abscess of unspecified site   . Muscle weakness (generalized)   . Cognitive communication deficit   . COPD (chronic obstructive  pulmonary disease)   . Sleep apnea   . 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 Acute respiratory failure with hypoxia 09/20/2012  . Acute respiratory acidosis 09/20/2012  . Cellulitis of left lower extremity 09/20/2012  .  OSA (obstructive sleep apnea) 09/20/2012  . Lumbosacral spondylosis without myelopathy 12/03/2013  . Degenerative joint disease of spine 12/03/2013  . Cervical spondylosis with myelopathy 12/03/2013    Neurologic Exam:  Mental status (patient on very low dose precedex): open eyes to verbal stimuli but doesn't follow commands.  CN 2-12: small pupils reactive to light. No gaze preference. Blinks to threat in the right but less response in the left. Face seems symmetric. Tongue: intubated.  Motor: no spontaneous movements.  Sensory: does not react to pain.  DTR's: 1+ all over.  Plantars: mute  Coordination and gait: unable to test.   Lab Results: No results found for this basename: cbc, bmp, coags, chol, tri, ldl, hga1c   Lipid Panel No results found for this basename: CHOL, TRIG, HDL, CHOLHDL, VLDL, LDLCALC,  in the last 72 hours  Studies/Results: Ct Head Wo Contrast  12/14/2013   CLINICAL DATA:  Altered mental status, possible CVA  EXAM: CT HEAD WITHOUT CONTRAST  TECHNIQUE: Contiguous axial images were obtained from the base of the skull through the vertex without intravenous contrast.  COMPARISON:  12/29/2013  FINDINGS: No skull fracture is noted. There is mucosal thickening bilateral ethmoid air cells. Mild mucosal thickening bilateral frontal sinus. The frontal sinuses are hypoplastic. Mild mucosal thickening maxillary sinuses.  The mastoid air cells are unremarkable. No intracranial hemorrhage, mass effect or midline shift. No acute infarction. No mass lesion is noted on this unenhanced scan. Ventricular size is stable from prior exam.  IMPRESSION: No acute intracranial abnormality. Paranasal sinuses disease as described above.   Electronically Signed   By: Natasha MeadLiviu  Pop M.D.    On: 12/09/2013 12:54   Mr Brain Wo Contrast  12/21/2013   CLINICAL DATA:  Stroke. Altered mental status. Toxic metabolic encephalopathy. Sepsis. Developmental delay.  EXAM: MRI HEAD WITHOUT CONTRAST  TECHNIQUE: Multiplanar, multiecho pulse sequences of the brain and surrounding structures were obtained without intravenous contrast.  COMPARISON:  Head CT 12/24/2013  FINDINGS: Images are mildly degraded by motion. There is no evidence of acute infarct, intracranial hemorrhage, mass, midline shift, or extra-axial fluid collection. Scattered foci of T2 hyperintensity are present in the subcortical and deep cerebral white matter bilaterally, most prominently in the periatrial regions, where there is some evidence of mild white matter volume loss, possibly indicating mild periventricular leukomalacia.  Prior bilateral ocular lens extractions are noted. Small bilateral mastoid effusions are present. Mild ethmoid air cell mucosal thickening is noted bilaterally. There is also mild left maxillary sinus mucosal thickening. Major intracranial vascular flow voids are preserved. Marrow signal in the clivus is mildly heterogeneous.  IMPRESSION: 1. No acute intracranial abnormality or mass. 2. Scattered foci of white matter T2 signal abnormality, nonspecific. Considerations include small vessel ischemia, sequelae of trauma, hypercoagulable state, vasculitis, migraines, prior infection or demyelination.   Electronically Signed   By: Sebastian AcheAllen  Grady   On: 12/21/2013 15:35   [place] Renal  12/24/2013   CLINICAL DATA:  Acute renal insufficiency.  EXAM: RENAL/URINARY TRACT ULTRASOUND COMPLETE  COMPARISON:  CT scan 12/18/2013  FINDINGS: Right Kidney:  Length: 11.1 cm. Normal renal cortical thickness and echogenicity. The contour is very irregular. This could be congenital fetal lobulation or scarring changes related to previous infection. No hydronephrosis. No renal mass.  Left Kidney:  Length: 10.7 cm. Normal renal cortical thickness and  echogenicity without focal lesions or hydronephrosis.  Bladder:  There is a Foley catheter in the decompressed bladder.  IMPRESSION: Normal renal cortical thickness, size and echogenicity without  focal lesions or hydronephrosis.  Abnormal right renal contour could be due to congenital fetal lobulation or scarring.   Electronically Signed   By: Loralie Champagne M.D.   On: 2014/01/11 14:40   Dg Chest Port 1 View  12/22/2013   EXAM: PORTABLE CHEST - 1 VIEW  COMPARISON:  None.  FINDINGS: Endotracheal tube is approximately 3 cm from carina. NG tube and right central venous line are unchanged. There are low lung volumes similar to prior. There is increased basilar atelectasis.  IMPRESSION: 1. Support apparatus appears in good position. 2. Increase in basilar atelectasis. 3. Very low lung volumes are similar prior.   Electronically Signed   By: Genevive Bi M.D.   On: 12/22/2013 07:44   Dg Chest Port 1 View  12/21/2013   CLINICAL DATA:  ET tube placement.  EXAM: PORTABLE CHEST - 1 VIEW  COMPARISON:  Jan 11, 2014.  FINDINGS: The endotracheal tube has advanced slightly since the prior radiograph and now lies at the carina. It should be pulled back 3-4 cm. Low lung volumes persist with bibasilar atelectasis. Mild vascular congestion.  IMPRESSION: ET tube too low, with tip at carina. Pull back 3-4 cm. Low lung volumes persist with mild vascular congestion.   Electronically Signed   By: Davonna Belling M.D.   On: 12/21/2013 08:02   Dg Abd Portable 1v  01/11/14   CLINICAL DATA:  Sepsis.  EXAM: PORTABLE ABDOMEN - 1 VIEW  COMPARISON:  CT abdomen and pelvis Jan 11, 2014.  FINDINGS: The bowel gas pattern is unremarkable. No abnormal abdominal calcification or focal bony abnormality is seen. NG tube is in place with the tip just within the stomach. The tube could be advanced 3-4 cm.  IMPRESSION: No acute finding.  NG tube tip is just within the stomach. The tube could be advanced 3-4 cm.   Electronically Signed   By: Drusilla Kanner M.D.   On: 01/11/14 15:09    MEDICATIONS                                                                                                                        Scheduled: . antiseptic oral rinse  7 mL Mouth Rinse QID  . chlorhexidine  15 mL Mouth Rinse BID  . famotidine  20 mg Per Tube Daily  . heparin  5,000 Units Subcutaneous 3 times per day  . ipratropium-albuterol  3 mL Nebulization Q6H  . piperacillin-tazobactam (ZOSYN)  IV  2.25 g Intravenous Q6H  . sodium chloride  10-40 mL Intracatheter Q12H  . sodium chloride  3 mL Intravenous Q12H  . valproate sodium  500 mg Intravenous 3 times per day    ASSESSMENT/PLAN:  48 y/o with AMS most likely secondary to septic-metabolic encephalopathy. ? Seizures: VPA level 22, will give another loading dose 1 gram and increase maintenance dose IV Depacon 750 mg Q 8 hours.  Repeat EEG in am to assess degree of encephalopathy versus NCSE (less likely). Will follow up.  Wyatt Portela, MD [facility] [phone]  12/22/2013, 9:48 AM

## 2013-12-22 NOTE — Progress Notes (Signed)
Subjective: Interval History: has no complaint, off pressors..  Objective: Vital signs in last 24 hours: Temp:  [97.5 F (36.4 C)-98.7 F (37.1 C)] 97.5 F (36.4 C) (08/16 0726) Pulse Rate:  [89-130] 106 (08/16 0800) Resp:  [9-23] 11 (08/16 0800) BP: (87-134)/(50-64) 129/64 mmHg (08/16 0734) SpO2:  [99 %-100 %] 100 % (08/16 0800) Arterial Line BP: (74-162)/(36-80) 121/59 mmHg (08/16 0800) FiO2 (%):  [40 %] 40 % (08/16 0800) Weight:  [83.9 kg (184 lb 15.5 oz)] 83.9 kg (184 lb 15.5 oz) (08/16 0400) Weight change: -1.1 kg (-2 lb 6.8 oz)  Intake/Output from previous day: 08/15 0701 - 08/16 0700 In: 1242.7 [I.V.:487.7; NG/GT:420; IV Piggyback:310] Out: 1961 [Urine:50; Emesis/NG output:400; Stool:25] Intake/Output this shift: Total I/O In: 34.3 [I.V.:14.3; NG/GT:20] Out: 86 [Urine:30; Other:56]  General appearance: toxic, uncooperative and some response to loud verbal stim, still with ankle clonus Resp: diminished breath sounds bilaterally and rhonchi bilaterally Cardio: regular rate and rhythm and systolic murmur: holosystolic 2/6, blowing at apex GI: few bs, but present, liver down 5 cm Extremities: edema 1+  Lab Results:  Recent Labs  12/21/13 0400 12/22/13 0404  WBC 7.7 5.9  HGB 9.6* 8.9*  HCT 29.2* 27.5*  PLT 103* 70*   BMET:  Recent Labs  12/21/13 1600 12/22/13 0404  NA 138 134*  K 4.8 4.2  CL 103 101  CO2 25 24  GLUCOSE 102* 116*  BUN 26* 17  CREATININE 2.89* 1.94*  CALCIUM 7.9* 7.9*   No results found for this basename: PTH,  in the last 72 hours Iron Studies: No results found for this basename: IRON, TIBC, TRANSFERRIN, FERRITIN,  in the last 72 hours  Studies/Results: Ct Head Wo Contrast  12/22/2013   CLINICAL DATA:  Altered mental status, possible CVA  EXAM: CT HEAD WITHOUT CONTRAST  TECHNIQUE: Contiguous axial images were obtained from the base of the skull through the vertex without intravenous contrast.  COMPARISON:  12/30/2013  FINDINGS: No skull  fracture is noted. There is mucosal thickening bilateral ethmoid air cells. Mild mucosal thickening bilateral frontal sinus. The frontal sinuses are hypoplastic. Mild mucosal thickening maxillary sinuses.  The mastoid air cells are unremarkable. No intracranial hemorrhage, mass effect or midline shift. No acute infarction. No mass lesion is noted on this unenhanced scan. Ventricular size is stable from prior exam.  IMPRESSION: No acute intracranial abnormality. Paranasal sinuses disease as described above.   Electronically Signed   By: Natasha MeadLiviu  Pop M.D.   On: 12/22/2013 12:54   Mr Brain Wo Contrast  12/21/2013   CLINICAL DATA:  Stroke. Altered mental status. Toxic metabolic encephalopathy. Sepsis. Developmental delay.  EXAM: MRI HEAD WITHOUT CONTRAST  TECHNIQUE: Multiplanar, multiecho pulse sequences of the brain and surrounding structures were obtained without intravenous contrast.  COMPARISON:  Head CT 12/29/2013  FINDINGS: Images are mildly degraded by motion. There is no evidence of acute infarct, intracranial hemorrhage, mass, midline shift, or extra-axial fluid collection. Scattered foci of T2 hyperintensity are present in the subcortical and deep cerebral white matter bilaterally, most prominently in the periatrial regions, where there is some evidence of mild white matter volume loss, possibly indicating mild periventricular leukomalacia.  Prior bilateral ocular lens extractions are noted. Small bilateral mastoid effusions are present. Mild ethmoid air cell mucosal thickening is noted bilaterally. There is also mild left maxillary sinus mucosal thickening. Major intracranial vascular flow voids are preserved. Marrow signal in the clivus is mildly heterogeneous.  IMPRESSION: 1. No acute intracranial abnormality or mass. 2. Scattered  foci of white matter T2 signal abnormality, nonspecific. Considerations include small vessel ischemia, sequelae of trauma, hypercoagulable state, vasculitis, migraines, prior  infection or demyelination.   Electronically Signed   By: Sebastian Ache   On: 12/21/2013 15:35   US Renal  12/26/2013   CLINICAL DATA:  Acute renal insufficiency.  EXAM: RENAL/URINARY TRACT ULTRASOUND COMPLETE  COMPARISON:  CT scan 01/02/2014  FINDINGS: Right Kidney:  Length: 11.1 cm. Normal renal cortical thickness and echogenicity. The contour is very irregular. This could be congenital fetal lobulation or scarring changes related to previous infection. No hydronephrosis. No renal mass.  Left Kidney:  Length: 10.7 cm. Normal renal cortical thickness and echogenicity without focal lesions or hydronephrosis.  Bladder:  There is a Foley catheter in the decompressed bladder.  IMPRESSION: Normal renal cortical thickness, size and echogenicity without focal lesions or hydronephrosis.  Abnormal right renal contour could be due to congenital fetal lobulation or scarring.   Electronically Signed   By: Loralie Champagne M.D.   On: 12/29/2013 14:40   Dg Chest Port 1 View  12/22/2013   EXAM: PORTABLE CHEST - 1 VIEW  COMPARISON:  None.  FINDINGS: Endotracheal tube is approximately 3 cm from carina. NG tube and right central venous line are unchanged. There are low lung volumes similar to prior. There is increased basilar atelectasis.  IMPRESSION: 1. Support apparatus appears in good position. 2. Increase in basilar atelectasis. 3. Very low lung volumes are similar prior.   Electronically Signed   By: Genevive Bi M.D.   On: 12/22/2013 07:44   Dg Chest Port 1 View  12/21/2013   CLINICAL DATA:  ET tube placement.  EXAM: PORTABLE CHEST - 1 VIEW  COMPARISON:  12/25/2013.  FINDINGS: The endotracheal tube has advanced slightly since the prior radiograph and now lies at the carina. It should be pulled back 3-4 cm. Low lung volumes persist with bibasilar atelectasis. Mild vascular congestion.  IMPRESSION: ET tube too low, with tip at carina. Pull back 3-4 cm. Low lung volumes persist with mild vascular congestion.    Electronically Signed   By: Davonna Belling M.D.   On: 12/21/2013 08:02   Dg Chest Port 1 View  12/30/2013   CLINICAL DATA:  Status post endotracheal tube placement and right internal jugular catheter placement.  EXAM: PORTABLE CHEST - 1 VIEW  COMPARISON:  Portable chest x-ray of December 20, 2013.  FINDINGS: The endotracheal tube tip lies approximately 4 cm above the crotch of the carina. The right internal jugular venous catheter tip lies in the proximal portion of the SVC. There is no postprocedure pneumothorax. The lung volumes remain low. The perihilar lung markings are increased. Atelectasis at the left lung base persists. The cardiac silhouette is mildly enlarged.  IMPRESSION: Interval placement of an endotracheal tube and right internal jugular venous catheter without postprocedure complication.   Electronically Signed   By: David  [place]   On: 12/13/2013 09:05   Dg Abd Portable 1v  12/25/2013   CLINICAL DATA:  Sepsis.  EXAM: PORTABLE ABDOMEN - 1 VIEW  COMPARISON:  CT abdomen and pelvis 12/27/2013.  FINDINGS: The bowel gas pattern is unremarkable. No abnormal abdominal calcification or focal bony abnormality is seen. NG tube is in place with the tip just within the stomach. The tube could be advanced 3-4 cm.  IMPRESSION: No acute finding.  NG tube tip is just within the stomach. The tube could be advanced 3-4 cm.   Electronically Signed   By: Maisie Fus  Dalessio M.D.   On: 01/01/2014 15:09    I have reviewed the patient's current medications.  Assessment/Plan: 1 AKI oliguric but making a little urine.  Acid/base/K ok. Good solute clearance 2 Anemia slightly worse 3 Sepsis presumably urinary with obstructive uropathy from myelopathy,  Did Bladder irrigation and will leave foley for now 4 low ptlt check HIT suspect is sepsis. If lower ptlt in am stop all hep 5 COPD 6 OSA 7 Developmental impairment 8 Myelopathy 9 Neuro  Clearly encephalopathic but also sz, ? Focal event 10 Sz 11 Nutrition on TF P  AB, Vent, CRRT, foley, check HIT , Sz meds 11    LOS: 2 days   Jace Dowe L 12/22/2013,8:21 AM

## 2013-12-22 NOTE — Progress Notes (Signed)
eLink Physician-Brief Progress Note Patient Name: Lyman BishopMichael D Keiffer DOB: 05-22-1965 MRN: [id number]   Date of Service  12/22/2013  HPI/Events of Note  Intubated patient with presumed seizures now with improving LOC and reaching for ETT.    eICU Interventions  Soft wrist restraints discussed with nurse and ordered     Intervention Category Minor Interventions: Agitation / anxiety - evaluation and management  Henry RusselSMITH, Stein Windhorst, P 12/22/2013, 4:10 PM

## 2013-12-22 NOTE — Progress Notes (Signed)
eLink Physician-Brief Progress Note Patient Name: Joshua BishopMichael D Cummings DOB: 1965-12-24 MRN: [id number]   Date of Service  12/22/2013  HPI/Events of Note  Patient admitted with sepsis from UTI.  Urine culture polymicrobial.  Zosyn planned for but not on med list  eICU Interventions  Zosyn ordered     Intervention Category Intermediate Interventions: Infection - evaluation and management  Henry RusselSMITH, Yoav Okane, P 12/22/2013, 4:01 PM

## 2013-12-22 NOTE — Progress Notes (Addendum)
PULMONARY / CRITICAL CARE MEDICINE   Name: Joshua BishopMichael D Tullis MRN: [id number] DOB: 12-31-1965    ADMISSION DATE:  12/11/2013 CONSULTATION DATE:  12/14/2013  REFERRING MD :  Dr. Allena KatzPatel   CHIEF COMPLAINT:  AKI, AMS, Acute Resp Fx  INITIAL PRESENTATION: 48 yr old male admitted pyelo, septic shock, ARF  STUDIES/SIGNIFICANT EVENTS:  8[facility]14 [facility] with fever, AMS, hypotension. Intubated for airway protection. Vasopressors initiated 8/14 CT ABD/Pelvis ([facility]):  no hydro, concern for L pyelo 8/14 CT Head/Neck [facility]):  no acute intracranial abnormalities, no acute injury to cervical spine 8/14 CT head: NAICP 8/14 Renal US: Normal renal cortical thickness, size and echogenicity without focal lesions or hydronephrosis 8/14 Neuro consult: MRI, EEG ordered. Valproic acid initiated 8/14 Renal consult: CRRT initiated 8/14 EEG: diffuse slowing consistent with non specific moderate encephalopathy. 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 No electrographic seizures noted. 8/14 Echocardiogram: normal LVEF. No valvular abnormalities 8/15 Vasopressors weaned to off 8/15 MRI brain: NAD 8/16 Concern for recurrent seizures. VPA level low. Reloaded and dose increased. Cognition improved somewhat but still prohibits extubation. Tolerates PS @ 5-10 cm H2O. Remains on CRRT 8/17 EEG (planned):   SUBJECTIVE:  RASS -1. Concern for recurrent seizures. VPA level low. Reloaded and dose increased. Cognition improved somewhat but still prohibits extubation. Tolerates PS @ 5-10 cm H2O. Remains on CRRT  VITAL SIGNS: Temp:  [97.5 F (36.4 C)-99.6 F (37.6 C)] 99.6 F (37.6 C) (08/16 1542) Pulse Rate:  [89-139] 120 (08/16 1600) Resp:  [11-23] 11 (08/16 1600) BP: (122-129)/(56-64) 122/56 mmHg (08/16 1521) SpO2:  [99 %-100 %] 100 % (08/16 1600) Arterial Line BP:  (74-162)/(36-80) 114/50 mmHg (08/16 1600) FiO2 (%):  [40 %] 40 % (08/16 1521) Weight:  [83.9 kg (184 lb 15.5 oz)] 83.9 kg (184 lb 15.5 oz) (08/16 0400)  HEMODYNAMICS: CVP:  [5 mmHg-19 mmHg] 7 mmHg  VENTILATOR SETTINGS: Vent Mode:  [-] PCV FiO2 (%):  [40 %] 40 % Set Rate:  [14 bmp] 14 bmp PEEP:  [5 cmH20] 5 cmH20 Pressure Support:  [5 cmH20] 5 cmH20 Plateau Pressure:  [9 cmH20-13 cmH20] 13 cmH20  INTAKE / OUTPUT:  Intake/Output Summary (Last 24 hours) at 12/22/13 1702 Last data filed at 12/22/13 1600  Gross per 24 hour  Intake 1409.9 ml  Output   1547 ml  Net -137.1 ml    PHYSICAL EXAMINATION: General:   Neuro:  RASS -1. MAEs HEENT:  NCAT, WNL Cardiovascular:  Tachy, reg, no M Lungs: clear anteriorly Abdomen:  Obese, soft, bsx4 active  Ext: no edema, warm  LABS: I have reviewed all of today's lab results. Relevant abnormalities are discussed in the A/P section  CXR: very low volumes, crowding of markings   ASSESSMENT / PLAN:  PULMONARY ETT 8/14 >>  A: Acute Respiratory Failure due to AMS Smoker, H/O COPD without acute bronchospasm P:   Cont vent support - settings reviewed and/or adjusted Wean in PS mode as tolerated  Cont vent bundle Daily SBT if/when meets criteria  CARDIOVASCULAR CVL R IJ HD 8/14 >>  R fem art line 8/14 >>  A:  Shock, suspect septic, resolved Bradycardia, resolved Sinus tachycardia, likely reactive  P:  Resume vasopressors as needed to maintain MAP > 60 mmHg Cont PRN metoprolol to maintain HR < 115/min  RENAL A:   AKI, anuric - presumably due to myelopathy and neurogenic bladder P:   CRRT  per renal service Monitor BMET intermittently Monitor I/Os Correct electrolytes as indicated  GASTROINTESTINAL A:   Diarrhea P:   SUP: enteral famotidine Cont TFs  HEMATOLOGIC A:   Anemia without evidence of acute blood loss Thrombocytopenia P:  DVT px: SCDs Monitor CBC intermittently Transfuse per usual ICU  guidelines  INFECTIOUS A:   Suspected severe sepsis Diarrhea, C diff negative Pyuria, suspected UTI P:   Blood 8/14 >>  C diff 8/14 >> NEG Urine 8/14 >> Multiple bacterial morphotypes Resp 8/14 >> NOF  Vanc 8/14 >> 8/15 Pip-tazo 8/14 >>   ENDOCRINE A:   Hyperglycemia, resolved.  P:   DC SSI Change CBGs to q 8 hrs  NEUROLOGIC A:   Acute Encephalopathy - improving Seizure P:   RASS goal: -1 DC Dexmedetomidine Cont PRN fent Cont PRN midaz Further eval per Neuro - repeat EEG 8/17 Management of AEDs per Neuro Cont PRN lorazepam for breakthrough seizures  I have personally obtained a history, examined the patient, evaluated laboratory and imaging results, formulated the assessment and plan and placed orders.  CRITICAL CARE: The patient is critically ill with multiple organ systems failure and requires high complexity decision making for assessment and support, frequent evaluation and titration of therapies, application of advanced monitoring technologies and extensive interpretation of multiple databases. Critical Care Time devoted to patient care services described in this note is 35 minutes.   Billy Fischer, MD ; [facility] [phone].  After 5:30 PM or weekends, call [phone]   12/22/2013, 5:02 PM

## 2013-12-23 ENCOUNTER — Inpatient Hospital Stay (HOSPITAL_COMMUNITY): Payer: Medicaid Other

## 2013-12-23 LAB — POCT ACTIVATED CLOTTING TIME
ACTIVATED CLOTTING TIME: 197 s
Activated Clotting Time: 202 seconds
Activated Clotting Time: 202 seconds

## 2013-12-23 LAB — RENAL FUNCTION PANEL
ALBUMIN: 1.6 g/dL — AB (ref 3.5–5.2)
Anion gap: 8 (ref 5–15)
BUN: 12 mg/dL (ref 6–23)
CO2: 27 mEq/L (ref 19–32)
CREATININE: 1.47 mg/dL — AB (ref 0.50–1.35)
Calcium: 8 mg/dL — ABNORMAL LOW (ref 8.4–10.5)
Chloride: 102 mEq/L (ref 96–112)
GFR calc Af Amer: 64 mL/min — ABNORMAL LOW (ref >90)
GFR calc non Af Amer: 55 mL/min — ABNORMAL LOW (ref >90)
Glucose, Bld: 81 mg/dL (ref 70–99)
PHOSPHORUS: 1.9 mg/dL — AB (ref 2.3–4.6)
Potassium: 4.3 mEq/L (ref 3.7–5.3)
Sodium: 137 mEq/L (ref 137–147)

## 2013-12-23 LAB — COMPREHENSIVE METABOLIC PANEL
ALBUMIN: 1.6 g/dL — AB (ref 3.5–5.2)
ALT: 57 U/L — ABNORMAL HIGH (ref 0–53)
ANION GAP: 9 (ref 5–15)
AST: 92 U/L — AB (ref 0–37)
Alkaline Phosphatase: 118 U/L — ABNORMAL HIGH (ref 39–117)
BILIRUBIN TOTAL: 0.3 mg/dL (ref 0.3–1.2)
BUN: 13 mg/dL (ref 6–23)
CO2: 26 meq/L (ref 19–32)
Calcium: 8 mg/dL — ABNORMAL LOW (ref 8.4–10.5)
Chloride: 104 mEq/L (ref 96–112)
Creatinine, Ser: 1.66 mg/dL — ABNORMAL HIGH (ref 0.50–1.35)
GFR calc Af Amer: 55 mL/min — ABNORMAL LOW (ref >90)
GFR calc non Af Amer: 48 mL/min — ABNORMAL LOW (ref >90)
Glucose, Bld: 83 mg/dL (ref 70–99)
POTASSIUM: 4.3 meq/L (ref 3.7–5.3)
Sodium: 139 mEq/L (ref 137–147)
Total Protein: 5 g/dL — ABNORMAL LOW (ref 6.0–8.3)

## 2013-12-23 LAB — CBC
HEMATOCRIT: 25.4 % — AB (ref 39.0–52.0)
Hemoglobin: 8.3 g/dL — ABNORMAL LOW (ref 13.0–17.0)
MCH: 32.5 pg (ref 26.0–34.0)
MCHC: 32.7 g/dL (ref 30.0–36.0)
MCV: 99.6 fL (ref 78.0–100.0)
Platelets: 55 10*3/uL — ABNORMAL LOW (ref 150–400)
RBC: 2.55 MIL/uL — ABNORMAL LOW (ref 4.22–5.81)
RDW: 15.5 % (ref 11.5–15.5)
WBC: 4.4 10*3/uL (ref 4.0–10.5)

## 2013-12-23 LAB — GLUCOSE, CAPILLARY
GLUCOSE-CAPILLARY: 88 mg/dL (ref 70–99)
GLUCOSE-CAPILLARY: 77 mg/dL (ref 70–99)
Glucose-Capillary: 78 mg/dL (ref 70–99)
Glucose-Capillary: 79 mg/dL (ref 70–99)
Glucose-Capillary: 86 mg/dL (ref 70–99)
Glucose-Capillary: 87 mg/dL (ref 70–99)
Glucose-Capillary: 92 mg/dL (ref 70–99)

## 2013-12-23 LAB — MAGNESIUM: Magnesium: 2.3 mg/dL (ref 1.5–2.5)

## 2013-12-23 LAB — PHOSPHORUS: Phosphorus: 1.8 mg/dL — ABNORMAL LOW (ref 2.3–4.6)

## 2013-12-23 LAB — VALPROIC ACID LEVEL: Valproic Acid Lvl: 47.9 ug/mL — ABNORMAL LOW (ref 50.0–100.0)

## 2013-12-23 LAB — APTT: aPTT: 71 s — ABNORMAL HIGH (ref 24–37)

## 2013-12-23 MED ORDER — IPRATROPIUM-ALBUTEROL 0.5-2.5 (3) MG/3ML IN SOLN: 3.0000 mL | RESPIRATORY_TRACT | Status: DC | PRN

## 2013-12-23 MED ORDER — PANTOPRAZOLE SODIUM 40 MG PO PACK
40.0000 mg | PACK | ORAL | Status: DC
  Administered 2013-12-23 – 2013-12-24 (×2): 40 mg
  Filled 2013-12-23 (×3): qty 20

## 2013-12-23 NOTE — Procedures (Signed)
ELECTROENCEPHALOGRAM REPORT  Date of Study: 12/23/2013  Patient's Name: [name] MRN: [id number] Date of Birth: 1965/06/10  Referring Provider: Dr. Wyatt Portelasvaldo Camilo  Clinical History: This is a 48 year old man with altered mental status in the setting of UTI and possible sepsis. While hospitalized patient required pressors for hypotension and intubation for respiratory insufficiency.  Medications: Depakote, metoprolol, Zosyn  Technical Summary: A multichannel digital EEG recording measured by the international 10-20 system with electrodes applied with paste and impedances below 5000 ohms performed as portable with EKG monitoring in an awake and asleep, intubated patient. Per records he is not on sedation.  Hyperventilation and photic stimulation were not performed.  The digital EEG was referentially recorded, reformatted, and digitally filtered in a variety of bipolar and referential montages for optimal display.   Description: The patient is awake and asleep during the recording. He is intubated, intermittently following commands.  During maximal wakefulness, there is an 8 Hz posterior dominant rhythm that poorly attenuates to eye opening and eye closure. This is admixed with moderate amount of diffuse 5-7 Hz theta and occasional 2-3 Hz delta slowing of the waking background, at times sharply contoured over the left temporal region without clear epileptogenic potential.  During drowsiness and sleep, there is an increase in theta slowing of the background.  Vertex waves and poorly formed sleep spindles were seen.  Hyperventilation and photic stimulation did not elicit any abnormalities.  There were no epileptiform discharges or electrographic seizures seen.    EKG lead showed sinus tachycardia.  Impression: This awake and asleep EEG is abnormal due to moderate diffuse slowing of the waking background.  Clinical Correlation of the above findings indicates diffuse cerebral dysfunction  that is non-specific in etiology and can be seen with hypoxic/ischemic injury, toxic/metabolic encephalopathies, or medication effect.  The absence of epileptiform discharges does not rule out a clinical diagnosis of epilepsy.  Clinical correlation is advised.   Patrcia DollyKaren Aquino, M.D.

## 2013-12-23 NOTE — Progress Notes (Signed)
PULMONARY / CRITICAL CARE MEDICINE   Name: [name] MRN: [id number] DOB: [birth year]    ADMISSION DATE:  12/17/2013 CONSULTATION DATE:  12/30/2013  REFERRING MD :  Dr. Allena Katz   CHIEF COMPLAINT:  AKI, AMS, Acute Resp Fx  INITIAL PRESENTATION: 48 yr old male admitted pyelo, septic shock, ARF  STUDIES/SIGNIFICANT EVENTS:  8/14 Admit to [facility] from [place], SNF resident with fever, AMS, hypotension. Intubated for airway protection. Vasopressors initiated 8/14 Neuro consult: Valproic acid initiated 8/14 Renal consult: CRRT initiated 8/15 Vasopressors weaned to off 8/16 ?recurrent seizures >> VPA level low  STUDIES: 8/14 CT ABD/Pelvis ([place]):  no hydro, concern for L pyelo 8/14 CT Head/Neck [facility]):  no acute intracranial abnormalities, no acute injury to cervical spine 8/14 CT head: NAICP 8/14 Renal US: Normal renal cortical thickness, size and echogenicity without focal lesions or hydronephrosis 8/14 EEG: bitemporal epileptiform discharges 8/14 Echocardiogram: normal LVEF. No valvular abnormalities 8/15 MRI brain: NAD  SUBJECTIVE:  Tolerating SBT.  Intermittent twitching.  Remains on CRRT.  VITAL SIGNS: Temp:  [97.7 F (36.5 C)-99.6 F (37.6 C)] 98.4 F (36.9 C) (08/17 0400) Pulse Rate:  [105-139] 121 (08/17 1000) Resp:  [11-23] 19 (08/17 1000) BP: (114-125)/(53-58) 114/53 mmHg (08/17 0743) SpO2:  [99 %-100 %] 100 % (08/17 1000) Arterial Line BP: (98-135)/(43-75) 132/66 mmHg (08/17 1000) FiO2 (%):  [40 %] 40 % (08/17 0743) Weight:  [186 lb 4.6 oz (84.5 kg)] 186 lb 4.6 oz (84.5 kg) (08/17 0420) HEMODYNAMICS: CVP:  [7 mmHg-18 mmHg] 18 mmHg VENTILATOR SETTINGS: Vent Mode:  [-] PSV;CPAP FiO2 (%):  [40 %] 40 % Set Rate:  [14 bmp] 14 bmp PEEP:  [5 cmH20] 5 cmH20 Pressure Support:  [5 cmH20] 5 cmH20 Plateau Pressure:  [12 cmH20-13 cmH20] 12 cmH20 INTAKE / OUTPUT:  Intake/Output Summary (Last 24 hours) at 12/23/13 1021 Last data filed at 12/23/13 1000  Gross per 24  hour  Intake   1158 ml  Output   1628 ml  Net   -470 ml    PHYSICAL EXAMINATION: General: ill appearing Neuro: opens eyes with stimulation, no following commands, moves extremities HEENT: ETT in place Cardiovascular: regular, tachycardic Lungs: no wheeze Abdomen: soft, mild diffuse tenderness, no guarding/rebound Ext: 1+ edema Skin: no rashes  LABS: CBC Recent Labs     12/21/13  0400  12/22/13  0404  12/23/13  0400  WBC  7.7  5.9  4.4  HGB  9.6*  8.9*  8.3*  HCT  29.2*  27.5*  25.4*  PLT  103*  70*  55*    Coag's Recent Labs     12/21/13  0400  12/22/13  0404  12/23/13  0400  APTT  41*  59*  71*  INR   --   1.24   --     BMET Recent Labs     12/22/13  0404  12/22/13  1645  12/23/13  0400  NA  134*  137  139  K  4.2  4.3  4.3  CL  101  102  104  CO2  24  27  26   BUN  17  14  13   CREATININE  1.94*  1.59*  1.66*  GLUCOSE  116*  88  83    Electrolytes Recent Labs     12/21/13  0400   12/22/13  0404  12/22/13  1645  12/23/13  0400  CALCIUM  7.8*   < >  7.9*  8.1*  8.0*  MG  2.0   --   2.3   --   2.3  PHOS  3.6   < >  2.9  2.0*  1.8*   < > = values in this interval not displayed.   ABG Recent Labs     12/21/13  0317  12/21/13  1630  PHART  7.342*  7.357  PCO2ART  41.2  45.7*  PO2ART  181.0*  190.0*    Liver Enzymes Recent Labs     12/21/13  0400   12/22/13  0404  12/22/13  1645  12/23/13  0400  AST  30   --   87*   --   92*  ALT  34   --   47   --   57*  ALKPHOS  164*   --   130*   --   118*  BILITOT  0.4   --   0.4   --   0.3  ALBUMIN  1.8*   < >  1.5*  1.7*  1.6*   < > = values in this interval not displayed.   Glucose Recent Labs     12/22/13  1254  12/22/13  2002  12/22/13  2004  12/23/13  0008  12/23/13  0349  12/23/13  0733  GLUCAP  102*  67*  80  87  92  88    Imaging Mr Brain Wo Contrast  12/21/2013   CLINICAL DATA:  Stroke. Altered mental status. Toxic metabolic encephalopathy. Sepsis. Developmental delay.   EXAM: MRI HEAD WITHOUT CONTRAST  TECHNIQUE: Multiplanar, multiecho pulse sequences of the brain and surrounding structures were obtained without intravenous contrast.  COMPARISON:  Head CT 12/09/2013  FINDINGS: Images are mildly degraded by motion. There is no evidence of acute infarct, intracranial hemorrhage, mass, midline shift, or extra-axial fluid collection. Scattered foci of T2 hyperintensity are present in the subcortical and deep cerebral white matter bilaterally, most prominently in the periatrial regions, where there is some evidence of mild white matter volume loss, possibly indicating mild periventricular leukomalacia.  Prior bilateral ocular lens extractions are noted. Small bilateral mastoid effusions are present. Mild ethmoid air cell mucosal thickening is noted bilaterally. There is also mild left maxillary sinus mucosal thickening. Major intracranial vascular flow voids are preserved. Marrow signal in the clivus is mildly heterogeneous.  IMPRESSION: 1. No acute intracranial abnormality or mass. 2. Scattered foci of white matter T2 signal abnormality, nonspecific. Considerations include small vessel ischemia, sequelae of trauma, hypercoagulable state, vasculitis, migraines, prior infection or demyelination.   Electronically Signed   By: Sebastian AcheAllen  Grady   On: 12/21/2013 15:35   Dg Chest Port 1 View  12/23/2013   CLINICAL DATA:  Respiratory failure  EXAM: PORTABLE CHEST - 1 VIEW  COMPARISON:  December 22, 2013  FINDINGS: Endotracheal tube tip is 1.8 cm above the carina. Nasogastric tube tip and side port are in the stomach. Central catheter tip is in the superior vena cava. No pneumothorax. There is patchy bibasilar atelectatic change. Elsewhere lungs are clear. Heart is borderline enlarged with pulmonary vascularity within normal limits. No adenopathy.  IMPRESSION: Tube and catheter positions as described without apparent pneumothorax. Patchy bibasilar atelectatic change. Lungs elsewhere clear.    Electronically Signed   By: Bretta BangWilliam  Woodruff M.D.   On: 12/23/2013 07:07   Dg Chest Port 1 View  12/22/2013   EXAM: PORTABLE CHEST - 1 VIEW  COMPARISON:  None.  FINDINGS: Endotracheal tube is approximately 3 cm from carina. NG tube and  right central venous line are unchanged. There are low lung volumes similar to prior. There is increased basilar atelectasis.  IMPRESSION: 1. Support apparatus appears in good position. 2. Increase in basilar atelectasis. 3. Very low lung volumes are similar prior.   Electronically Signed   By: Genevive Bi M.D.   On: 12/22/2013 07:44    ASSESSMENT / PLAN:  PULMONARY ETT 8/14 >>   A: Acute respiratory failure 2nd to septic shock, altered mentals status. Hx of smoking and reported hx of COPD. Hx of OSA. P:   Pressure support wean as tolerated Mental status precludes extubation trial F/u CXR Change to PRN BD's  CARDIOVASCULAR CVL R IJ HD 8/14 >>  R fem art line 8/14 >>   A:  Septic shock >> resolved 8/15. Bradycardia >> resolved Sinus tachycardia in setting of sepsis. P:  Monitor hemodynamics PRN lopressor  RENAL A:   AKI, anuric 2nd to pyelonephritis, septic shock, myelopathy and neurogenic bladder. P:   CRRT per renal  GASTROINTESTINAL A:   Diarrhea, C diff negative 8/14 >> improved. Nutrition. P:   Continue tube feeds while on vent Change to protonix for SUP  HEMATOLOGIC A:   Anemia of critical illness. Thrombocytopenia developed 8/14 >> baseline PLT 248 from 12/07/13. P:  F/u CBC F/u HIT panel from 8/17  SCD for DVT prevention  INFECTIOUS A:   Septic shock 2nd to pyelonephritis; multiple organism in urine cx 8/14. P:   Day 4 of Abx, currently on zosyn  ENDOCRINE A:   Hyperglycemia, resolved.  P:   DC SSI Change CBGs to q 8 hrs  NEUROLOGIC A:   Acute Encephalopathy 2nd to seizures, sepsis, AKI. P:   F/u EEG 8/17 AED's per neurology RASS goal 0  D/w Dr. Arrie Aran  CC time 35 minutes.  Coralyn Helling,  MD [facility] Pulmonary/Critical Care 12/23/2013, 10:38 AM Pager:  [phone] After 3pm call: [phone]

## 2013-12-23 NOTE — Progress Notes (Signed)
ANTIBIOTIC CONSULT NOTE - FOLLOW UP  Pharmacy Consult for Zosyn Indication: sepsis, pyelonephritis  No Known Allergies  Patient Measurements: Height: 5' 1.81" (157 cm) Weight: 186 lb 4.6 oz (84.5 kg) IBW/kg (Calculated) : 54.17  Vital Signs: Temp: 98.4 F (36.9 C) (08/17 1130) Temp src: Oral (08/17 1130) BP: 124/55 mmHg (08/17 1130) Pulse Rate: 114 (08/17 1130) Intake/Output from previous day: 08/16 0701 - 08/17 0700 In: 1166.5 [I.V.:231.5; NG/GT:570; IV Piggyback:325] Out: 1645 [Urine:135; Stool:300] Intake/Output from this shift: Total I/O In: 150 [I.V.:40; NG/GT:110] Out: 230 [Other:230]  Labs:  Recent Labs  12/26/2013 1335  12/21/13 0400  12/22/13 0404 12/22/13 1645 12/23/13 0400  WBC  --   --  7.7  --  5.9  --  4.4  HGB  --   --  9.6*  --  8.9*  --  8.3*  PLT  --   --  103*  --  70*  --  55*  LABCREA 52.14  --   --   --   --   --   --   CREATININE  --   < > 3.86*  < > 1.94* 1.59* 1.66*  < > = values in this interval not displayed. Estimated Creatinine Clearance: 51.6 ml/min (by C-G formula based on Cr of 1.66). No results found for this basename: VANCOTROUGH, Leodis BinetVANCOPEAK, VANCORANDOM, GENTTROUGH, GENTPEAK, GENTRANDOM, TOBRATROUGH, TOBRAPEAK, TOBRARND, AMIKACINPEAK, AMIKACINTROU, AMIKACIN,  in the last 72 hours   Microbiology: Recent Results (from the past 720 hour(s))  MRSA PCR SCREENING     Status: None   Collection Time    12/31/2013  5:57 AM      Result Value Ref Range Status   MRSA by PCR NEGATIVE  NEGATIVE Final   Comment:            The GeneXpert MRSA Assay (FDA     approved for NASAL specimens     only), is one component of a     comprehensive MRSA colonization     surveillance program. It is not     intended to diagnose MRSA     infection nor to guide or     monitor treatment for     MRSA infections.  CULTURE, BLOOD (ROUTINE X 2)     Status: None   Collection Time    01/03/2014  8:20 AM      Result Value Ref Range Status   Specimen Description  BLOOD CENTRAL LINE   Final   Special Requests BOTTLES DRAWN AEROBIC ONLY 6CC   Final   Culture  Setup Time     Final   Value: 12/25/2013 13:45     Performed at [facility]   Culture     Final   Value:        BLOOD CULTURE RECEIVED NO GROWTH TO DATE CULTURE WILL BE HELD FOR 5 DAYS BEFORE ISSUING A FINAL NEGATIVE REPORT     Performed at [facility]   Report Status PENDING   Incomplete  CLOSTRIDIUM DIFFICILE BY PCR     Status: None   Collection Time    12/21/2013  8:40 AM      Result Value Ref Range Status   C difficile by pcr NEGATIVE  NEGATIVE Final  URINE CULTURE     Status: None   Collection Time    12/28/2013  8:41 AM      Result Value Ref Range Status   Specimen Description URINE, RANDOM   Final   Special Requests  NONE   Final   Culture  Setup Time     Final   Value: Dec 27, 2013 13:49     Performed at [facility] Count     Final   Value: >=100,000 COLONIES/ML     Performed at [facility]   Culture     Final   Value: Multiple bacterial morphotypes present, none predominant. Suggest appropriate recollection if clinically indicated.     Performed at [facility]   Report Status 12/22/2013 FINAL   Final  CULTURE, BLOOD (ROUTINE X 2)     Status: None   Collection Time    12-27-13  9:45 AM      Result Value Ref Range Status   Specimen Description BLOOD RIGHT HAND   Final   Special Requests BOTTLES DRAWN AEROBIC ONLY 4CC   Final   Culture  Setup Time     Final   Value: 12-27-2013 13:47     Performed at [facility]   Culture     Final   Value:        BLOOD CULTURE RECEIVED NO GROWTH TO DATE CULTURE WILL BE HELD FOR 5 DAYS BEFORE ISSUING A FINAL NEGATIVE REPORT     Performed at [facility]   Report Status PENDING   Incomplete  CULTURE, RESPIRATORY (NON-EXPECTORATED)     Status: None   Collection Time    12-27-2013 10:37 AM      Result Value Ref Range Status   Specimen Description ENDOTRACHEAL   Final   Special  Requests Normal   Final   Gram Stain     Final   Value: RARE WBC PRESENT, PREDOMINANTLY MONONUCLEAR     RARE SQUAMOUS EPITHELIAL CELLS PRESENT     RARE GRAM POSITIVE COCCI     IN PAIRS IN CLUSTERS     Performed at [facility]   Culture     Final   Value: Non-Pathogenic Oropharyngeal-type Flora Isolated.     Performed at [facility]   Report Status 12/22/2013 FINAL   Final    Anti-infectives   Start     Dose/Rate Route Frequency Ordered Stop   12/22/13 1800  piperacillin-tazobactam (ZOSYN) IVPB 2.25 g  Status:  Discontinued     2.25 g 100 mL/hr over 30 Minutes Intravenous 4 times per day 12/22/13 1600 12/22/13 1601   12/22/13 1800  piperacillin-tazobactam (ZOSYN) IVPB 2.25 g     2.25 g 100 mL/hr over 30 Minutes Intravenous 4 times per day 12/22/13 1603     12/21/13 1100  polymyxin B 500,000 Units, bacitracin 50,000 Units in sodium chloride irrigation 0.9 % 500 mL irrigation      Irrigation  Once 12/21/13 0923 12/21/13 1047   12/21/13 1000  vancomycin (VANCOCIN) IVPB 1000 mg/200 mL premix  Status:  Discontinued     1,000 mg 200 mL/hr over 60 Minutes Intravenous Every 24 hours Dec 27, 2013 1113 12/21/13 0904   27-Dec-2013 1400  piperacillin-tazobactam (ZOSYN) IVPB 2.25 g  Status:  Discontinued     2.25 g 100 mL/hr over 30 Minutes Intravenous Every 6 hours 12-27-13 0946 12/22/13 1129   2013/12/27 0830  vancomycin (VANCOCIN) 50 mg/mL oral solution 500 mg  Status:  Discontinued     500 mg Oral 4 times per day 2013/12/27 0816 27-Dec-2013 1309   2013/12/27 0830  metroNIDAZOLE (FLAGYL) IVPB 500 mg  Status:  Discontinued     500 mg 100 mL/hr over 60 Minutes Intravenous Every 8  hours 12/22/2013 0816 12/14/2013 1309   12/25/2013 0800  vancomycin (VANCOCIN) IVPB 1000 mg/200 mL premix     1,000 mg 200 mL/hr over 60 Minutes Intravenous  Once January 04, 2014 0659 12/30/2013 0901   12/28/2013 0700  piperacillin-tazobactam (ZOSYN) IVPB 3.375 g     3.375 g 12.5 mL/hr over 240 Minutes Intravenous 3 times per day  12/20/2013 0659 01/02/2014 1236   12/30/2013 0645  piperacillin-tazobactam (ZOSYN) IVPB 3.375 g  Status:  Discontinued     3.375 g 100 mL/hr over 30 Minutes Intravenous  Once 12/30/2013 4098 04-Jan-2014 0656   12/07/2013 0645  vancomycin (VANCOCIN) IVPB 1000 mg/200 mL premix  Status:  Discontinued     1,000 mg 200 mL/hr over 60 Minutes Intravenous  Once 12/20/2013 1191 12/10/2013 0656      Assessment: 48 year old male on Day #4 of Zosyn for sepsis due to pyelonephritis.  His regimen is appropriate for CVVHDF support.  His cultures are unrevealing with regards to isolation of a specific causative organism.  Plan:  Continue Zosyn 2.25gm IV q6h Follow renal function and renal replacement therapy plans  Estella Husk, Pharm.D., BCPS, AAHIVP Clinical Pharmacist Phone: [phone] or [phone] 12/23/2013, 11:45 AM

## 2013-12-23 NOTE — Progress Notes (Signed)
S: Patient intubated, responds to touch or loud verbal stimuli but no tracking.  Continues of CRRT. O:BP 114/53  Pulse 117  Temp(Src) 98.4 F (36.9 C) (Axillary)  Resp 17  Ht 5' 1.81" (1.57 m)  Wt 186 lb 4.6 oz (84.5 kg)  BMI 34.28 kg/m2  SpO2 100%  Intake/Output Summary (Last 24 hours) at 12/23/13 0943 Last data filed at 12/23/13 0900  Gross per 24 hour  Intake 1190.1 ml  Output   1627 ml  Net -436.9 ml   Intake/Output: I/O last 3 completed shifts: In: 2120.2 [I.V.:560.2; Other:65; NG/GT:910; IV Piggyback:585] Out: 2611 [Urine:185; ZOXWR:6045Other:2101; Stool:325]  Intake/Output this shift:  Total I/O In: 90 [I.V.:20; NG/GT:70] Out: 131 [Other:131] Weight change: 1 lb 5.2 oz (0.6 kg) WUJ:WJXBJYNWGGen:intubated, RASS -1 HEENT: PERRL NFA:OZHYQMVHQIOCVS:Tachycardic, systolic murmur Resp:ronchi over bases NGE:XBMWUXLKGMAbd:hypoactive bowel sounds WNU:UVOZDExt:trace edema   Recent Labs Lab 12/16/2013 0815 12/13/2013 1611 12/21/13 0400 12/21/13 1600 12/22/13 0404 12/22/13 1645 12/23/13 0400  NA 146 147 140 138 134* 137 139  K 4.5 5.1 5.0 4.8 4.2 4.3 4.3  CL 110 111 104 103 101 102 104  CO2 21 18* 21 25 24 27 26   GLUCOSE 140* 80 106* 102* 116* 88 83  BUN 86* 81* 40* 26* 17 14 13   CREATININE 7.64* 7.58* 3.86* 2.89* 1.94* 1.59* 1.66*  ALBUMIN 1.5* 1.9* 1.8* 1.8* 1.5* 1.7* 1.6*  CALCIUM 6.2* 6.7* 7.8* 7.9* 7.9* 8.1* 8.0*  PHOS  --  4.9* 3.6 3.7 2.9 2.0* 1.8*  AST 19  --  30  --  87*  --  92*  ALT 22  --  34  --  47  --  57*   Liver Function Tests:  Recent Labs Lab 12/21/13 0400  12/22/13 0404 12/22/13 1645 12/23/13 0400  AST 30  --  87*  --  92*  ALT 34  --  47  --  57*  ALKPHOS 164*  --  130*  --  118*  BILITOT 0.4  --  0.4  --  0.3  PROT 5.7*  --  5.1*  --  5.0*  ALBUMIN 1.8*  < > 1.5* 1.7* 1.6*  < > = values in this interval not displayed. No results found for this basename: LIPASE, AMYLASE,  in the last 168 hours No results found for this basename: AMMONIA,  in the last 168 hours CBC:  Recent Labs Lab  12/31/2013 0815 12/21/13 0400 12/22/13 0404 12/23/13 0400  WBC 6.5 7.7 5.9 4.4  NEUTROABS 5.3  --   --   --   HGB 8.2* 9.6* 8.9* 8.3*  HCT 24.5* 29.2* 27.5* 25.4*  MCV 96.8 97.7 100.0 99.6  PLT 106* 103* 70* 55*   Cardiac Enzymes:  Recent Labs Lab 01/06/2014 0815  TROPONINI <0.30   CBG:  Recent Labs Lab 12/22/13 2002 12/22/13 2004 12/23/13 0008 12/23/13 0349 12/23/13 0733  GLUCAP 67* 80 87 92 88    Iron Studies: No results found for this basename: IRON, TIBC, TRANSFERRIN, FERRITIN,  in the last 72 hours Studies/Results: Mr Brain Wo Contrast  12/21/2013   CLINICAL DATA:  Stroke. Altered mental status. Toxic metabolic encephalopathy. Sepsis. Developmental delay.  EXAM: MRI HEAD WITHOUT CONTRAST  TECHNIQUE: Multiplanar, multiecho pulse sequences of the brain and surrounding structures were obtained without intravenous contrast.  COMPARISON:  Head CT 12/19/2013  FINDINGS: Images are mildly degraded by motion. There is no evidence of acute infarct, intracranial hemorrhage, mass, midline shift, or extra-axial fluid collection. Scattered foci of T2 hyperintensity are  present in the subcortical and deep cerebral white matter bilaterally, most prominently in the periatrial regions, where there is some evidence of mild white matter volume loss, possibly indicating mild periventricular leukomalacia.  Prior bilateral ocular lens extractions are noted. Small bilateral mastoid effusions are present. Mild ethmoid air cell mucosal thickening is noted bilaterally. There is also mild left maxillary sinus mucosal thickening. Major intracranial vascular flow voids are preserved. Marrow signal in the clivus is mildly heterogeneous.  IMPRESSION: 1. No acute intracranial abnormality or mass. 2. Scattered foci of white matter T2 signal abnormality, nonspecific. Considerations include small vessel ischemia, sequelae of trauma, hypercoagulable state, vasculitis, migraines, prior infection or demyelination.    Electronically Signed   By: Sebastian Ache   On: 12/21/2013 15:35   Dg Chest Port 1 View  12/23/2013   CLINICAL DATA:  Respiratory failure  EXAM: PORTABLE CHEST - 1 VIEW  COMPARISON:  December 22, 2013  FINDINGS: Endotracheal tube tip is 1.8 cm above the carina. Nasogastric tube tip and side port are in the stomach. Central catheter tip is in the superior vena cava. No pneumothorax. There is patchy bibasilar atelectatic change. Elsewhere lungs are clear. Heart is borderline enlarged with pulmonary vascularity within normal limits. No adenopathy.  IMPRESSION: Tube and catheter positions as described without apparent pneumothorax. Patchy bibasilar atelectatic change. Lungs elsewhere clear.   Electronically Signed   By: Bretta Bang M.D.   On: 12/23/2013 07:07   Dg Chest Port 1 View  12/22/2013   EXAM: PORTABLE CHEST - 1 VIEW  COMPARISON:  None.  FINDINGS: Endotracheal tube is approximately 3 cm from carina. NG tube and right central venous line are unchanged. There are low lung volumes similar to prior. There is increased basilar atelectasis.  IMPRESSION: 1. Support apparatus appears in good position. 2. Increase in basilar atelectasis. 3. Very low lung volumes are similar prior.   Electronically Signed   By: Genevive Bi M.D.   On: 12/22/2013 07:44   . antiseptic oral rinse  7 mL Mouth Rinse QID  . chlorhexidine  15 mL Mouth Rinse BID  . famotidine  20 mg Per Tube Daily  . ipratropium-albuterol  3 mL Nebulization Q6H  . piperacillin-tazobactam (ZOSYN)  IV  2.25 g Intravenous 4 times per day  . sodium chloride  10-40 mL Intracatheter Q12H  . sodium chloride  3 mL Intravenous Q12H  . valproate sodium  750 mg Intravenous 3 times per day    BMET    Component Value Date/Time   NA 139 12/23/2013 0400   K 4.3 12/23/2013 0400   CL 104 12/23/2013 0400   CO2 26 12/23/2013 0400   GLUCOSE 83 12/23/2013 0400   BUN 13 12/23/2013 0400   CREATININE 1.66* 12/23/2013 0400   CALCIUM 8.0* 12/23/2013 0400    GFRNONAA 48* 12/23/2013 0400   GFRAA 55* 12/23/2013 0400   CBC    Component Value Date/Time   WBC 4.4 12/23/2013 0400   RBC 2.55* 12/23/2013 0400   RBC 4.28 09/21/2012 0436   HGB 8.3* 12/23/2013 0400   HCT 25.4* 12/23/2013 0400   PLT 55* 12/23/2013 0400   MCV 99.6 12/23/2013 0400   MCH 32.5 12/23/2013 0400   MCHC 32.7 12/23/2013 0400   RDW 15.5 12/23/2013 0400   LYMPHSABS 0.7 12-26-13 0815   MONOABS 0.4 12-26-2013 0815   EOSABS 0.0 12/26/2013 0815   BASOSABS 0.0 12/26/13 0815   Blood Cx 2013/12/26: 2/2 NGTD Urine Cx 12/26/2013: Multiple morphotypes present Respiratory Cx December 26, 2013:  Non pathogenic oropharyngeal type flora   Assessment/Plan:  1. Acute Renal Failure/ Anuric- No hydronephrosis from renal ultra sound although previous Lumbar MRI shows distended bladder, currently on CRRT, UOP 30 cc in last 12 hours. 2. Sepsis- unclear if due to UTI versus GI source (c diff neg), currently on Zosyn 3. Thrombocytopenia- Platelets 106 on admission down to 55 today. Will D/C Heparin and order HIT panel since his platelets have continued to fall. 4. Acute Encephalopathy/ Seizures: VPA level low, reloaded and dose increased 8/16, repeat EEG pending  Gust Rung I have seen and examined this patient and agree with plan as outlined above by Dr. Mikey Bussing with the above edits.  Currently stable and will cont with CVVHD for now. Mckinze Poirier A,MD 12/23/2013 12:01 PM

## 2013-12-23 NOTE — Progress Notes (Signed)
EEG completed; results pending.    

## 2013-12-23 NOTE — Progress Notes (Signed)
Subjective: Currently hooked up to EEG.  Withdrawing legs to pain and spontaneously, showing no seizure activity. Will open eyes to comend and show thumb to command  Objective: Current vital signs: BP 124/55  Pulse 114  Temp(Src) 98.4 F (36.9 C) (Axillary)  Resp 16  Ht 5' 1.81" (1.57 m)  Wt 84.5 kg (186 lb 4.6 oz)  BMI 34.28 kg/m2  SpO2 100% Vital signs in last 24 hours: Temp:  [97.7 F (36.5 C)-99.6 F (37.6 C)] 98.4 F (36.9 C) (08/17 0400) Pulse Rate:  [106-139] 114 (08/17 1130) Resp:  [11-23] 16 (08/17 1130) BP: (114-125)/(53-58) 124/55 mmHg (08/17 1130) SpO2:  [99 %-100 %] 100 % (08/17 1130) Arterial Line BP: (98-135)/(43-75) 109/50 mmHg (08/17 1100) FiO2 (%):  [40 %] 40 % (08/17 1130) Weight:  [84.5 kg (186 lb 4.6 oz)] 84.5 kg (186 lb 4.6 oz) (08/17 0420)  Intake/Output from previous day: 08/16 0701 - 08/17 0700 In: 1166.5 [I.V.:231.5; NG/GT:570; IV Piggyback:325] Out: 1645 [Urine:135; Stool:300] Intake/Output this shift: Total I/O In: 150 [I.V.:40; NG/GT:110] Out: 230 [Other:230] Nutritional status:    Neurologic Exam: Mental Status: Opens eyes to command, withdraws from pain in both upper and LE extremities.  Does respond to deep sternal rub.   Cranial Nerves: II: patient does not respond confrontation bilaterally, pupils right 3 mm, left 3 mm,and reactive bilaterally III,IV,VI: doll's response present bilaterally.  V,VII: corneal reflex present bilaterally  VIII: patient does not respond to verbal stimuli IX,X: gag reflex present, XI: trapezius strength unable to test bilaterally XII: tongue strength unable to test Motor: Increased tone throughout, withdraws from pain in bilateral LE with 4/5 strength and UE with 4-/5 strength Sensory: As noted above Deep Tendon Reflexes:  2+ bilaterally in UE and LE, Plantars: downgoing bilaterally Cerebellar: Unable to perform    Lab Results: Basic Metabolic Panel:  Recent Labs Lab 12/13/2013 1611  12/21/13 0400 12/21/13 1600 12/22/13 0404 12/22/13 1645 12/23/13 0400  NA 147 140 138 134* 137 139  K 5.1 5.0 4.8 4.2 4.3 4.3  CL 111 104 103 101 102 104  CO2 18* 21 25 24 27 26   GLUCOSE 80 106* 102* 116* 88 83  BUN 81* 40* 26* 17 14 13   CREATININE 7.58* 3.86* 2.89* 1.94* 1.59* 1.66*  CALCIUM 6.7* 7.8* 7.9* 7.9* 8.1* 8.0*  MG  --  2.0  --  2.3  --  2.3  PHOS 4.9* 3.6 3.7 2.9 2.0* 1.8*    Liver Function Tests:  Recent Labs Lab 12/27/2013 0815  12/21/13 0400 12/21/13 1600 12/22/13 0404 12/22/13 1645 12/23/13 0400  AST 19  --  30  --  87*  --  92*  ALT 22  --  34  --  47  --  57*  ALKPHOS 86  --  164*  --  130*  --  118*  BILITOT <0.2*  --  0.4  --  0.4  --  0.3  PROT 4.5*  --  5.7*  --  5.1*  --  5.0*  ALBUMIN 1.5*  < > 1.8* 1.8* 1.5* 1.7* 1.6*  < > = values in this interval not displayed. No results found for this basename: LIPASE, AMYLASE,  in the last 168 hours No results found for this basename: AMMONIA,  in the last 168 hours  CBC:  Recent Labs Lab 12/10/2013 0815 12/21/13 0400 12/22/13 0404 12/23/13 0400  WBC 6.5 7.7 5.9 4.4  NEUTROABS 5.3  --   --   --   HGB 8.2* 9.6*  8.9* 8.3*  HCT 24.5* 29.2* 27.5* 25.4*  MCV 96.8 97.7 100.0 99.6  PLT 106* 103* 70* 55*    Cardiac Enzymes:  Recent Labs Lab 01/01/2014 0815  TROPONINI <0.30    Lipid Panel: No results found for this basename: CHOL, TRIG, HDL, CHOLHDL, VLDL, LDLCALC,  in the last 168 hours  CBG:  Recent Labs Lab 12/22/13 2002 12/22/13 2004 12/23/13 0008 12/23/13 0349 12/23/13 0733  GLUCAP 67* 80 87 92 88    Microbiology: Results for orders placed during the hospital encounter of 12/26/2013  MRSA PCR SCREENING     Status: None   Collection Time    12/07/2013  5:57 AM      Result Value Ref Range Status   MRSA by PCR NEGATIVE  NEGATIVE Final   Comment:            The GeneXpert MRSA Assay (FDA     approved for NASAL specimens     only), is one component of a     comprehensive MRSA  colonization     surveillance program. It is not     intended to diagnose MRSA     infection nor to guide or     monitor treatment for     MRSA infections.  CULTURE, BLOOD (ROUTINE X 2)     Status: None   Collection Time    12/09/2013  8:20 AM      Result Value Ref Range Status   Specimen Description BLOOD CENTRAL LINE   Final   Special Requests BOTTLES DRAWN AEROBIC ONLY 6CC   Final   Culture  Setup Time     Final   Value: 12/18/2013 13:45     Performed at [facility]   Culture     Final   Value:        BLOOD CULTURE RECEIVED NO GROWTH TO DATE CULTURE WILL BE HELD FOR 5 DAYS BEFORE ISSUING A FINAL NEGATIVE REPORT     Performed at [facility]   Report Status PENDING   Incomplete  CLOSTRIDIUM DIFFICILE BY PCR     Status: None   Collection Time    01/01/2014  8:40 AM      Result Value Ref Range Status   C difficile by pcr NEGATIVE  NEGATIVE Final  URINE CULTURE     Status: None   Collection Time    12/31/2013  8:41 AM      Result Value Ref Range Status   Specimen Description URINE, RANDOM   Final   Special Requests NONE   Final   Culture  Setup Time     Final   Value: 01/02/2014 13:49     Performed at [facility] Count     Final   Value: >=100,000 COLONIES/ML     Performed at [facility]   Culture     Final   Value: Multiple bacterial morphotypes present, none predominant. Suggest appropriate recollection if clinically indicated.     Performed at [facility]   Report Status 12/22/2013 FINAL   Final  CULTURE, BLOOD (ROUTINE X 2)     Status: None   Collection Time    01/03/2014  9:45 AM      Result Value Ref Range Status   Specimen Description BLOOD RIGHT HAND   Final   Special Requests BOTTLES DRAWN AEROBIC ONLY 4CC   Final   Culture  Setup Time     Final   Value:  2014-04-15 13:47     [facility]   Culture     Final   Value:        BLOOD CULTURE RECEIVED NO GROWTH TO DATE CULTURE WILL BE HELD FOR 5 DAYS  BEFORE ISSUING A FINAL NEGATIVE REPORT     [facility]   Report Status PENDING   Incomplete  CULTURE, RESPIRATORY (NON-EXPECTORATED)     Status: None   Collection Time    July 16, 2013 10:37 AM      Result Value Ref Range Status   Specimen Description ENDOTRACHEAL   Final   Special Requests Normal   Final   Gram Stain     Final   Value: RARE WBC PRESENT, PREDOMINANTLY MONONUCLEAR     RARE SQUAMOUS EPITHELIAL CELLS PRESENT     RARE GRAM POSITIVE COCCI     IN PAIRS IN CLUSTERS     Performed at [facility]   Culture     Final   Value: Non-Pathogenic Oropharyngeal-type Flora Isolated.     [facility]formed at [facility]   Report Status 12/22/2013 FINAL   Final    Coagulation Studies:  Recent Labs  12/22/13 0404  LABPROT 15.6*  INR 1.24    Imaging: Mr Brain Wo Contrast  12/21/2013   CLINICAL DATA:  Stroke. Altered mental status. Toxic metabolic encephalopathy. Sepsis. Developmental delay.  EXAM: MRI HEAD WITHOUT CONTRAST  TECHNIQUE: Multiplanar, multiecho pulse sequences of the brain and surrounding structures were obtained without intravenous contrast.  COMPARISON:  Head CT 2014-04-15  FINDINGS: Images are mildly degraded by motion. There is no evidence of acute infarct, intracranial hemorrhage, mass, midline shift, or extra-axial fluid collection. Scattered foci of T2 hyperintensity are present in the subcortical and deep cerebral white matter bilaterally, most prominently in the periatrial regions, where there is some evidence of mild white matter volume loss, possibly indicating mild periventricular leukomalacia.  Prior bilateral ocular lens extractions are noted. Small bilateral mastoid effusions are present. Mild ethmoid air cell mucosal thickening is noted bilaterally. There is also mild left maxillary sinus mucosal thickening. Major intracranial vascular flow voids are preserved. Marrow signal in the clivus is mildly heterogeneous.  IMPRESSION: 1. No  acute intracranial abnormality or mass. 2. Scattered foci of white matter T2 signal abnormality, nonspecific. Considerations include small vessel ischemia, sequelae of trauma, hypercoagulable state, vasculitis, migraines, prior infection or demyelination.   Electronically Signed   By: Sebastian AcheAllen  Grady   On: 12/21/2013 15:35   Dg Chest Port 1 View  12/23/2013   CLINICAL DATA:  Respiratory failure  EXAM: PORTABLE CHEST - 1 VIEW  COMPARISON:  December 22, 2013  FINDINGS: Endotracheal tube tip is 1.8 cm above the carina. Nasogastric tube tip and side port are in the stomach. Central catheter tip is in the superior vena cava. No pneumothorax. There is patchy bibasilar atelectatic change. Elsewhere lungs are clear. Heart is borderline enlarged with pulmonary vascularity within normal limits. No adenopathy.  IMPRESSION: Tube and catheter positions as described without apparent pneumothorax. Patchy bibasilar atelectatic change. Lungs elsewhere clear.   Electronically Signed   By: Bretta BangWilliam  Woodruff M.D.   On: 12/23/2013 07:07   Dg Chest Port 1 View  12/22/2013   EXAM: PORTABLE CHEST - 1 VIEW  COMPARISON:  None.  FINDINGS: Endotracheal tube is approximately 3 cm from carina. NG tube and right central venous line are unchanged. There are low lung volumes similar to prior. There is increased basilar atelectasis.  IMPRESSION:  1. Support apparatus appears in good position. 2. Increase in basilar atelectasis. 3. Very low lung volumes are similar prior.   Electronically Signed   By: Genevive Bi M.D.   On: 12/22/2013 07:44    Medications:  Scheduled: . antiseptic oral rinse  7 mL Mouth Rinse QID  . chlorhexidine  15 mL Mouth Rinse BID  . pantoprazole sodium  40 mg Per Tube Q24H  . piperacillin-tazobactam (ZOSYN)  IV  2.25 g Intravenous 4 times per day  . sodium chloride  10-40 mL Intracatheter Q12H  . sodium chloride  3 mL Intravenous Q12H  . valproate sodium  750 mg Intravenous 3 times per day     Assessment/Plan:  48 y/o with AMS most likely secondary to septic-metabolic encephalopathy. Repeat VPA level pending Repeat EEG pending Will continue to follow.    Felicie Morn PA-C [facility] [phone]  12/23/2013, 11:37 AM

## 2013-12-24 ENCOUNTER — Inpatient Hospital Stay (HOSPITAL_COMMUNITY): Payer: Medicaid Other

## 2013-12-24 LAB — CBC
HCT: 26.2 % — ABNORMAL LOW (ref 39.0–52.0)
Hemoglobin: 8.5 g/dL — ABNORMAL LOW (ref 13.0–17.0)
MCH: 32.9 pg (ref 26.0–34.0)
MCHC: 32.4 g/dL (ref 30.0–36.0)
MCV: 101.6 fL — ABNORMAL HIGH (ref 78.0–100.0)
PLATELETS: 48 10*3/uL — AB (ref 150–400)
RBC: 2.58 MIL/uL — ABNORMAL LOW (ref 4.22–5.81)
RDW: 14.9 % (ref 11.5–15.5)
WBC: 4.5 10*3/uL (ref 4.0–10.5)

## 2013-12-24 LAB — GLUCOSE, CAPILLARY
GLUCOSE-CAPILLARY: 77 mg/dL (ref 70–99)
Glucose-Capillary: 109 mg/dL — ABNORMAL HIGH (ref 70–99)
Glucose-Capillary: 113 mg/dL — ABNORMAL HIGH (ref 70–99)
Glucose-Capillary: 130 mg/dL — ABNORMAL HIGH (ref 70–99)
Glucose-Capillary: 79 mg/dL (ref 70–99)

## 2013-12-24 LAB — BASIC METABOLIC PANEL
ANION GAP: 9 (ref 5–15)
BUN: 12 mg/dL (ref 6–23)
CALCIUM: 8 mg/dL — AB (ref 8.4–10.5)
CHLORIDE: 103 meq/L (ref 96–112)
CO2: 26 mEq/L (ref 19–32)
CREATININE: 1.59 mg/dL — AB (ref 0.50–1.35)
GFR calc non Af Amer: 50 mL/min — ABNORMAL LOW (ref >90)
GFR, EST AFRICAN AMERICAN: 58 mL/min — AB (ref >90)
Glucose, Bld: 84 mg/dL (ref 70–99)
Potassium: 4.2 mEq/L (ref 3.7–5.3)
Sodium: 138 mEq/L (ref 137–147)

## 2013-12-24 LAB — APTT: aPTT: 33 seconds (ref 24–37)

## 2013-12-24 LAB — HEPARIN INDUCED THROMBOCYTOPENIA PNL
HEPARIN INDUCED PLT AB: NEGATIVE
PATIENT O. D.: 0.093
UFH High Dose UFH H: 10 % Release
UFH Low Dose 0.1 IU/mL: 4 % Release
UFH Low Dose 0.5 IU/mL: 4 % Release
UFH SRA RESULT: NEGATIVE

## 2013-12-24 LAB — MAGNESIUM: Magnesium: 2.3 mg/dL (ref 1.5–2.5)

## 2013-12-24 MED ORDER — ANTICOAGULANT SODIUM CITRATE 4% (200MG/5ML) IV SOLN
5.0000 mL | Freq: Once | Status: AC
  Administered 2013-12-24: 2.4 mL via INTRAVENOUS
  Filled 2013-12-24: qty 250

## 2013-12-24 MED ORDER — NEPRO/CARBSTEADY PO LIQD
1000.0000 mL | ORAL | Status: DC
Start: 2013-12-24 — End: 2013-12-25
  Filled 2013-12-24 (×3): qty 1000

## 2013-12-24 MED ORDER — VITAL HIGH PROTEIN PO LIQD: 1000.0000 mL | ORAL | Status: DC

## 2013-12-24 MED ORDER — PRO-STAT SUGAR FREE PO LIQD
60.0000 mL | Freq: Three times a day (TID) | ORAL | Status: DC
  Administered 2013-12-24 (×2): 60 mL
  Filled 2013-12-24 (×5): qty 60

## 2013-12-24 NOTE — Progress Notes (Addendum)
NUTRITION FOLLOW UP  DOCUMENTATION CODES Per approved criteria  -Obesity Unspecified   INTERVENTION:  Nepro formula at 15 ml/hr with Prostat liquid protein 60 ml TID via tube to provide 1248 kcals (72% of estimated kcal needs), 119 gm protein (100% of estimated protein needs), 263 ml of free water RD to follow for nutrition care plan  NUTRITION DIAGNOSIS: Inadequate oral intake related to inability to eat as evidenced by NPO, ongoing   Goal: Enteral nutrition to provide 60-70% of estimated calorie needs (22-25 kcals/kg ideal body weight) and 100% of estimated protein needs, based on ASPEN guidelines for permissive underfeeding in critically ill obese individuals, currently unmet  Monitor:  TF regimen & tolerance, respiratory status, weight, labs, I/O's  ASSESSMENT: 48 y/o M, SNF Resident, with PMH of Developmental delay, DJD, O2 dependent COPD / Tobacco Abuse, OSA, Cervical / Lumbosacral spondylosis & recent admission to [facility] for back pain & falls who presented to [facility] on 12/24/2013 with fever and AMS. Reportedly he has had a worsening mental status for 3-4 days prior to admission. Work up at [facility] remarkable for UA positive for UTI. Given acute kidney injury, patient was transferred to [facility] for further care. Upon arrival to [facility], the patient was assessed by [facility] and found to have concerns for posturing and inability to protect his airway and shock and PCCM emergently consulted for evaluation. Patient was intubated and HD catheter placed after assessment.  Patient is currently intubated on ventilator support MV: 6.8 L/min Temp (24hrs), Avg:98.4 F (36.9 C), Min:97.9 F (36.6 C), Max:98.8 F (37.1 C)   Patient s/p EEG 8/17.  Nepro formula initiated 8/15 and currently infusing at 20 ml/hr via OGT providing 864 kcals, 39 gm protein, 349 ml of free water.  Patient not requiring pressors for 24 hours.  Plan is to transition to IHD and D/C CVVHD  today.  Height: Ht Readings from Last 1 Encounters:  12/28/2013 5' 1.81" (1.57 m)    Weight: -----> stable Wt Readings from Last 1 Encounters:  12/24/13 186 lb 4.6 oz (84.5 kg)  Admit wt          191 lb (86.8 kg)  BMI:  Body mass index is 34.28 kg/(m^2).  Estimated Nutritional Needs: Kcal: 1724 Protein: 110-120 g Fluid: per MD  Skin: Intact  Diet Order: NPO   Intake/Output Summary (Last 24 hours) at 12/24/13 1217 Last data filed at 12/24/13 1203  Gross per 24 hour  Intake   1318 ml  Output   1445 ml  Net   -127 ml   Labs:   Recent Labs Lab 12/22/13 0404 12/22/13 1645 12/23/13 0400 12/23/13 1613 12/24/13 0400  NA 134* 137 139 137 138  K 4.2 4.3 4.3 4.3 4.2  CL 101 102 104 102 103  CO2 24 27 26 27 26   BUN 17 14 13 12 12   CREATININE 1.94* 1.59* 1.66* 1.47* 1.59*  CALCIUM 7.9* 8.1* 8.0* 8.0* 8.0*  MG 2.3  --  2.3  --  2.3  PHOS 2.9 2.0* 1.8* 1.9*  --   GLUCOSE 116* 88 83 81 84    CBG (last 3)   Recent Labs  12/23/13 1953 12/23/13 2314 12/24/13 0824  GLUCAP 79 86 77    Scheduled Meds: . anticoagulant sodium citrate  5 mL Intravenous Once  . antiseptic oral rinse  7 mL Mouth Rinse QID  . chlorhexidine  15 mL Mouth Rinse BID  . pantoprazole sodium  40 mg Per Tube  Q24H  . piperacillin-tazobactam (ZOSYN)  IV  2.25 g Intravenous 4 times per day  . sodium chloride  10-40 mL Intracatheter Q12H  . sodium chloride  3 mL Intravenous Q12H  . valproate sodium  750 mg Intravenous 3 times per day    Continuous Infusions: . sodium chloride 10 mL/hr at 12/23/13 1709  . feeding supplement (NEPRO CARB STEADY) 1,000 mL (12/24/13 1101)  . dialysis replacement fluid (prismasate) 800 mL/hr at 12/24/13 0529  . dialysis replacement fluid (prismasate) 700 mL/hr at 12/24/13 0946  . dialysate (PRISMASATE) 2,000 mL/hr at 12/24/13 1203    Past Medical History  Diagnosis Date  . Acute respiratory failure   . Hypoxemia   . Encephalopathy, unspecified   . Pain in limb    . Hyperosmolality and/or hypernatremia   . Dehydration   . Altered mental status   . Other specific developmental learning difficulties   . Acidosis   . Gallstone ileus   . Unspecified vitamin B deficiency   . Tobacco use disorder   . Acute conjunctivitis, unspecified   . Other chronic allergic conjunctivitis   . Unspecified essential hypertension   . Cellulitis and abscess of unspecified site   . Muscle weakness (generalized)   . Cognitive communication deficit   . COPD (chronic obstructive pulmonary disease)   . Sleep apnea   . 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 Acute respiratory failure with hypoxia 09/20/2012  . Acute respiratory acidosis 09/20/2012  . Cellulitis of left lower extremity 09/20/2012  . OSA (obstructive sleep apnea) 09/20/2012  . Lumbosacral spondylosis without myelopathy 12/03/2013  . Degenerative joint disease of spine 12/03/2013  . Cervical spondylosis with myelopathy 12/03/2013    Past Surgical History  Procedure Laterality Date  . Eye surgery      cataracts  . Radiology with anesthesia N/A 12/09/2013    Procedure: MRI OF LUMBAR W/WO CONTRAST ;  Surgeon: Medication Radiologist, MD;  Location: [facility] OR;  Service: Radiology;  Laterality: N/A;    Maureen ChattersKatie Gerrard Crystal, RD, LDN Pager #: [phone] After-Hours Pager #: [phone]

## 2013-12-24 NOTE — Progress Notes (Signed)
S: Patient intubated, opens eyes to touch but does not follow commands O:BP 139/70  Pulse 103  Temp(Src) 97.9 F (36.6 C) (Axillary)  Resp 22  Ht 5' 1.81" (1.57 m)  Wt 186 lb 4.6 oz (84.5 kg)  BMI 34.28 kg/m2  SpO2 100%  Intake/Output Summary (Last 24 hours) at 12/24/13 0846 Last data filed at 12/24/13 0800  Gross per 24 hour  Intake   1220 ml  Output   1503 ml  Net   -283 ml   Intake/Output: I/O last 3 completed shifts: In: 4 [I.V.:331; Other:120; NG/GT:900; IV Piggyback:587] Out: 2422 [Urine:190; ZOXWR:6045; Stool:500]  Intake/Output this shift:  Total I/O In: -  Out: 60 [Other:60] Weight change: 0 lb (0 kg) WUJ:WJXBJYNWG HEENT: PERRL  NFA:OZHYQMVHQIO Resp:ronchi over bases NGE:XBMWUXLKGM bowel sounds WNU:UVOZD edema   Recent Labs Lab 12/27/2013 0815 2013-12-27 1611 12/21/13 0400 12/21/13 1600 12/22/13 0404 12/22/13 1645 12/23/13 0400 12/23/13 1613 12/24/13 0400  NA 146 147 140 138 134* 137 139 137 138  K 4.5 5.1 5.0 4.8 4.2 4.3 4.3 4.3 4.2  CL 110 111 104 103 101 102 104 102 103  CO2 21 18* 21 25 24 27 26 27 26   GLUCOSE 140* 80 106* 102* 116* 88 83 81 84  BUN 86* 81* 40* 26* 17 14 13 12 12   CREATININE 7.64* 7.58* 3.86* 2.89* 1.94* 1.59* 1.66* 1.47* 1.59*  ALBUMIN 1.5* 1.9* 1.8* 1.8* 1.5* 1.7* 1.6* 1.6*  --   CALCIUM 6.2* 6.7* 7.8* 7.9* 7.9* 8.1* 8.0* 8.0* 8.0*  PHOS  --  4.9* 3.6 3.7 2.9 2.0* 1.8* 1.9*  --   AST 19  --  30  --  87*  --  92*  --   --   ALT 22  --  34  --  47  --  57*  --   --    Liver Function Tests:  Recent Labs Lab 12/21/13 0400  12/22/13 0404 12/22/13 1645 12/23/13 0400 12/23/13 1613  AST 30  --  87*  --  92*  --   ALT 34  --  47  --  57*  --   ALKPHOS 164*  --  130*  --  118*  --   BILITOT 0.4  --  0.4  --  0.3  --   PROT 5.7*  --  5.1*  --  5.0*  --   ALBUMIN 1.8*  < > 1.5* 1.7* 1.6* 1.6*  < > = values in this interval not displayed. No results found for this basename: LIPASE, AMYLASE,  in the last 168 hours No results  found for this basename: AMMONIA,  in the last 168 hours CBC:  Recent Labs Lab December 27, 2013 0815 12/21/13 0400 12/22/13 0404 12/23/13 0400 12/24/13 0400  WBC 6.5 7.7 5.9 4.4 4.5  NEUTROABS 5.3  --   --   --   --   HGB 8.2* 9.6* 8.9* 8.3* 8.5*  HCT 24.5* 29.2* 27.5* 25.4* 26.2*  MCV 96.8 97.7 100.0 99.6 101.6*  PLT 106* 103* 70* 55* 48*   Cardiac Enzymes:  Recent Labs Lab December 27, 2013 0815  TROPONINI <0.30   CBG:  Recent Labs Lab 12/23/13 1139 12/23/13 1613 12/23/13 1953 12/23/13 2314 12/24/13 0824  GLUCAP 77 78 79 86 77    Iron Studies: No results found for this basename: IRON, TIBC, TRANSFERRIN, FERRITIN,  in the last 72 hours Studies/Results: Dg Chest Port 1 View  12/24/2013   CLINICAL DATA:  Atelectasis, followup, intubation, history smoking, COPD  EXAM: PORTABLE CHEST - 1 VIEW  COMPARISON:  Portable exam 0559 hr compared 12/23/2013  FINDINGS: Tip of endotracheal tube projects 1.5 cm above carina.  Nasogastric tube coiled in stomach with tip at proximal stomach.  RIGHT jugular dual-lumen central venous catheter with tip projecting over mid SVC.  Upper normal heart size.  Normal mediastinal contours.  Peribronchial thickening with bibasilar atelectasis.  No definite infiltrate, pleural effusion or pneumothorax.  IMPRESSION: Bronchitic changes with bibasilar atelectasis.   Electronically Signed   By: Ulyses SouthwardMark  Boles M.D.   On: 12/24/2013 08:04   Dg Chest Port 1 View  12/23/2013   CLINICAL DATA:  Respiratory failure  EXAM: PORTABLE CHEST - 1 VIEW  COMPARISON:  December 22, 2013  FINDINGS: Endotracheal tube tip is 1.8 cm above the carina. Nasogastric tube tip and side port are in the stomach. Central catheter tip is in the superior vena cava. No pneumothorax. There is patchy bibasilar atelectatic change. Elsewhere lungs are clear. Heart is borderline enlarged with pulmonary vascularity within normal limits. No adenopathy.  IMPRESSION: Tube and catheter positions as described without  apparent pneumothorax. Patchy bibasilar atelectatic change. Lungs elsewhere clear.   Electronically Signed   By: Bretta BangWilliam  Woodruff M.D.   On: 12/23/2013 07:07   . antiseptic oral rinse  7 mL Mouth Rinse QID  . chlorhexidine  15 mL Mouth Rinse BID  . pantoprazole sodium  40 mg Per Tube Q24H  . piperacillin-tazobactam (ZOSYN)  IV  2.25 g Intravenous 4 times per day  . sodium chloride  10-40 mL Intracatheter Q12H  . sodium chloride  3 mL Intravenous Q12H  . valproate sodium  750 mg Intravenous 3 times per day    BMET    Component Value Date/Time   NA 138 12/24/2013 0400   K 4.2 12/24/2013 0400   CL 103 12/24/2013 0400   CO2 26 12/24/2013 0400   GLUCOSE 84 12/24/2013 0400   BUN 12 12/24/2013 0400   CREATININE 1.59* 12/24/2013 0400   CALCIUM 8.0* 12/24/2013 0400   GFRNONAA 50* 12/24/2013 0400   GFRAA 58* 12/24/2013 0400   CBC    Component Value Date/Time   WBC 4.5 12/24/2013 0400   RBC 2.58* 12/24/2013 0400   RBC 4.28 09/21/2012 0436   HGB 8.5* 12/24/2013 0400   HCT 26.2* 12/24/2013 0400   PLT 48* 12/24/2013 0400   MCV 101.6* 12/24/2013 0400   MCH 32.9 12/24/2013 0400   MCHC 32.4 12/24/2013 0400   RDW 14.9 12/24/2013 0400   LYMPHSABS 0.7 06/24/2013 0815   MONOABS 0.4 06/24/2013 0815   EOSABS 0.0 06/24/2013 0815   BASOSABS 0.0 06/24/2013 0815   Blood Cx 10/08/2013: 2/2 NGTD Urine Cx 10/08/2013: Multiple morphotypes present Respiratory Cx 10/08/2013: Non pathogenic oropharyngeal type flora   Assessment/Plan:  1. Acute Renal Failure/ Oliguric- No hydronephrosis from renal ultra sound although previous Lumbar MRI shows distended bladder (likely neurogenic bladder), UOP 160 cc in last 24 hours, currently on CVVHD 2. Sepsis- due to pyelonephritis, on Zosyn 3. Thrombocytopenia- Platelets 48 today, HIT panel pending 4. Acute Encephalopathy/ Seizures: EEG yesterday showed moderate diffuse slowing.  Gust RungHoffman, Erik C  I have seen and examined this patient and agree with plan as outlined by Dr. Mikey BussingHoffman.  Not  requiring pressors for 24 hours.  Will transition to IHD and d/c CVVHD later today. Wilgus Deyton A,MD 12/24/2013 10:57 AM

## 2013-12-24 NOTE — Progress Notes (Signed)
PULMONARY / CRITICAL CARE MEDICINE   Name: [name] MRN: [id number] DOB: February 07, 1966    ADMISSION DATE:  12/15/2013 CONSULTATION DATE:  12/11/2013  REFERRING MD :  Dr. Allena Katz   CHIEF COMPLAINT:  AKI, AMS, Acute Resp Fx  INITIAL PRESENTATION: 48 yr old male admitted pyelo, septic shock, ARF  STUDIES/SIGNIFICANT EVENTS:  8/14 Admit to [facility] from [place], SNF resident with fever, AMS, hypotension. Intubated for airway protection. Vasopressors initiated 8/14 Neuro consult: Valproic acid initiated 8/14 Renal consult: CRRT initiated 8/15 Vasopressors weaned to off 8/16 ?recurrent seizures >> VPA level low  STUDIES: 8/14 CT ABD/Pelvis ([facility]):  no hydro, concern for L pyelo 8/14 CT Head/Neck [facility]):  no acute intracranial abnormalities, no acute injury to cervical spine 8/14 CT head: NAICP 8/14 Renal US: Normal renal cortical thickness, size and echogenicity without focal lesions or hydronephrosis 8/14 EEG: bitemporal epileptiform discharges 8/14 Echocardiogram: normal LVEF. No valvular abnormalities 8/15 MRI brain: NAD 8/17 EEG: diffuse slowing  SUBJECTIVE:  Tolerating SBT.  Remains on CRRT.  VITAL SIGNS: Temp:  [98.4 F (36.9 C)-98.8 F (37.1 C)] 98.5 F (36.9 C) (08/18 0401) Pulse Rate:  [102-128] 102 (08/18 0700) Resp:  [13-28] 17 (08/18 0700) BP: (114-131)/(53-64) 114/55 mmHg (08/18 0332) SpO2:  [100 %] 100 % (08/18 0700) Arterial Line BP: (109-166)/(50-92) 131/66 mmHg (08/18 0700) FiO2 (%):  [40 %] 40 % (08/18 0332) Weight:  [186 lb 4.6 oz (84.5 kg)] 186 lb 4.6 oz (84.5 kg) (08/18 0600) HEMODYNAMICS: CVP:  [9 mmHg-18 mmHg] 11 mmHg VENTILATOR SETTINGS: Vent Mode:  [-] PCV FiO2 (%):  [40 %] 40 % Set Rate:  [14 bmp] 14 bmp PEEP:  [5 cmH20] 5 cmH20 Pressure Support:  [5 cmH20] 5 cmH20 Plateau Pressure:  [12 cmH20] 12 cmH20 INTAKE / OUTPUT:  Intake/Output Summary (Last 24 hours) at 12/24/13 0733 Last data filed at 12/24/13 0700  Gross per 24 hour  Intake    1280 ml  Output   1518 ml  Net   -238 ml    PHYSICAL EXAMINATION: General: ill appearing Neuro: opens eyes with stimulation, moves extremities, following some simple commands HEENT: ETT in place Cardiovascular: regular, tachycardic Lungs: no wheeze Abdomen: soft, non tender Ext: 1+ edema Skin: no rashes  LABS: CBC Recent Labs     12/22/13  0404  12/23/13  0400  12/24/13  0400  WBC  5.9  4.4  4.5  HGB  8.9*  8.3*  8.5*  HCT  27.5*  25.4*  26.2*  PLT  70*  55*  48*    Coag's Recent Labs     12/22/13  0404  12/23/13  0400  12/24/13  0400  APTT  59*  71*  33  INR  1.24   --    --     BMET Recent Labs     12/23/13  0400  12/23/13  1613  12/24/13  0400  NA  139  137  138  K  4.3  4.3  4.2  CL  104  102  103  CO2  26  27  26   BUN  13  12  12   CREATININE  1.66*  1.47*  1.59*  GLUCOSE  83  81  84    Electrolytes Recent Labs     12/22/13  0404  12/22/13  1645  12/23/13  0400  12/23/13  1613  12/24/13  0400  CALCIUM  7.9*  8.1*  8.0*  8.0*  8.0*  MG  2.3   --  2.3   --   2.3  PHOS  2.9  2.0*  1.8*  1.9*   --    ABG Recent Labs     12/21/13  1630  PHART  7.357  PCO2ART  45.7*  PO2ART  190.0*    Liver Enzymes Recent Labs     12/22/13  0404  12/22/13  1645  12/23/13  0400  12/23/13  1613  AST  87*   --   92*   --   ALT  47   --   57*   --   ALKPHOS  130*   --   118*   --   BILITOT  0.4   --   0.3   --   ALBUMIN  1.5*  1.7*  1.6*  1.6*   Glucose Recent Labs     12/23/13  0349  12/23/13  0733  12/23/13  1139  12/23/13  1613  12/23/13  1953  12/23/13  2314  GLUCAP  92  88  77  78  79  86    Imaging Dg Chest Port 1 View  12/23/2013   CLINICAL DATA:  Respiratory failure  EXAM: PORTABLE CHEST - 1 VIEW  COMPARISON:  December 22, 2013  FINDINGS: Endotracheal tube tip is 1.8 cm above the carina. Nasogastric tube tip and side port are in the stomach. Central catheter tip is in the superior vena cava. No pneumothorax. There is patchy  bibasilar atelectatic change. Elsewhere lungs are clear. Heart is borderline enlarged with pulmonary vascularity within normal limits. No adenopathy.  IMPRESSION: Tube and catheter positions as described without apparent pneumothorax. Patchy bibasilar atelectatic change. Lungs elsewhere clear.   Electronically Signed   By: Bretta Bang M.D.   On: 12/23/2013 07:07    ASSESSMENT / PLAN:  PULMONARY ETT 8/14 >>   A: Acute respiratory failure 2nd to septic shock, altered mentals status. Hx of smoking and reported hx of COPD. Hx of OSA. P:   Pressure support wean as tolerated >> Mental status precludes extubation trial F/u CXR PRN BD's  CARDIOVASCULAR CVL R IJ HD 8/14 >>  R fem art line 8/14 >>   A:  Septic shock >> resolved 8/15. Bradycardia >> resolved Sinus tachycardia in setting of sepsis. P:  Monitor hemodynamics PRN lopressor for HR > 115  RENAL A:   AKI, anuric 2nd to pyelonephritis, septic shock, myelopathy and neurogenic bladder. P:   CRRT per renal  GASTROINTESTINAL A:   Diarrhea, C diff negative 8/14 >> improved. Nutrition. P:   Continue tube feeds while on vent >> advance tube feeds as tolerated per nutrition recommendations Protonix for SUP  HEMATOLOGIC A:   Anemia of critical illness. Thrombocytopenia developed 8/14 >> baseline PLT 248 from 12/07/13. P:  F/u CBC F/u HIT panel from 8/17  SCD for DVT prevention  INFECTIOUS A:   Septic shock 2nd to pyelonephritis; multiple organism in urine cx 8/14. P:   Day 5 of Abx, currently on zosyn  Blood 8/14 >>   ENDOCRINE A:   Hyperglycemia, resolved.  P:   q4h CBG's while on vent   NEUROLOGIC A:   Acute Encephalopathy 2nd to seizures, sepsis, AKI >> some improvement in mental status 8/18.  P:   AED's per neurology RASS goal 0  Summary: Mental status, sepsis slowly improving.  Not ready for extubation trial yet, but getting closer.  CC time 35 minutes.  Coralyn Helling, MD [facility]  Pulmonary/Critical Care 12/24/2013, 7:33 AM Pager:  [phone]  After 3pm call: [phone]

## 2013-12-25 ENCOUNTER — Inpatient Hospital Stay (HOSPITAL_COMMUNITY): Payer: Medicaid Other

## 2013-12-25 LAB — RENAL FUNCTION PANEL
Albumin: 1.5 g/dL — ABNORMAL LOW (ref 3.5–5.2)
Anion gap: 11 (ref 5–15)
BUN: 31 mg/dL — ABNORMAL HIGH (ref 6–23)
CALCIUM: 8.3 mg/dL — AB (ref 8.4–10.5)
CO2: 27 mEq/L (ref 19–32)
Chloride: 104 mEq/L (ref 96–112)
Creatinine, Ser: 3.04 mg/dL — ABNORMAL HIGH (ref 0.50–1.35)
GFR calc Af Amer: 27 mL/min — ABNORMAL LOW (ref >90)
GFR, EST NON AFRICAN AMERICAN: 23 mL/min — AB (ref >90)
Glucose, Bld: 106 mg/dL — ABNORMAL HIGH (ref 70–99)
PHOSPHORUS: 3.7 mg/dL (ref 2.3–4.6)
Potassium: 4.1 mEq/L (ref 3.7–5.3)
SODIUM: 142 meq/L (ref 137–147)

## 2013-12-25 LAB — CBC
HCT: 26.2 % — ABNORMAL LOW (ref 39.0–52.0)
Hemoglobin: 8.4 g/dL — ABNORMAL LOW (ref 13.0–17.0)
MCH: 32.8 pg (ref 26.0–34.0)
MCHC: 32.1 g/dL (ref 30.0–36.0)
MCV: 102.3 fL — AB (ref 78.0–100.0)
PLATELETS: 55 10*3/uL — AB (ref 150–400)
RBC: 2.56 MIL/uL — ABNORMAL LOW (ref 4.22–5.81)
RDW: 14.6 % (ref 11.5–15.5)
WBC: 5.1 10*3/uL (ref 4.0–10.5)

## 2013-12-25 LAB — GLUCOSE, CAPILLARY
Glucose-Capillary: 116 mg/dL — ABNORMAL HIGH (ref 70–99)
Glucose-Capillary: 111 mg/dL — ABNORMAL HIGH (ref 70–99)
Glucose-Capillary: 93 mg/dL (ref 70–99)

## 2013-12-25 LAB — VALPROIC ACID LEVEL: Valproic Acid Lvl: 55.7 ug/mL (ref 50.0–100.0)

## 2013-12-25 MED ORDER — PANTOPRAZOLE SODIUM 40 MG IV SOLR
40.0000 mg | INTRAVENOUS | Status: DC
  Administered 2013-12-25 – 2013-12-27 (×3): 40 mg via INTRAVENOUS
  Filled 2013-12-25 (×3): qty 40

## 2013-12-25 NOTE — Procedures (Signed)
Extubation Procedure Note  Patient Details:   Name: Joshua BishopMichael D Heldt DOB: 12-Feb-1966 MRN: [id number]   Airway Documentation:     Evaluation  O2 sats: stable throughout Complications: No apparent complications Patient did tolerate procedure well. Bilateral Breath Sounds: Rhonchi Suctioning: Airway Yes pt able to vocalize.   Pt extubated at this time per MD order and tolerating well. No complications. VS stable at this time. Pt able to breathe around deflated cuff. Pt unable to perform IS due to mental status. Pt placed on 4L [place]. Pt able to cough but difficulty getting pt to cough on command. No stridor noted. RT will continue to monitor.   Loyal Jacobsonhompson, [facility] 12/25/2013, 9:36 AM

## 2013-12-25 NOTE — Progress Notes (Signed)
PULMONARY / CRITICAL CARE MEDICINE   Name: [name] MRN: [id number] DOB: [birth year]    ADMISSION DATE:  12/26/2013 CONSULTATION DATE:  12/16/2013  REFERRING MD :  Dr. Allena Katz   CHIEF COMPLAINT:  AKI, AMS, Acute Resp Fx  INITIAL PRESENTATION: 48 yr old male admitted pyelo, septic shock, ARF  STUDIES/SIGNIFICANT EVENTS:  8/14 Admit to [facility] from [place], SNF resident with fever, AMS, hypotension. Intubated for airway protection. Vasopressors initiated 8/14 Neuro consult: Valproic acid initiated 8/14 Renal consult: CRRT initiated 8/15 Vasopressors weaned to off 8/16 ?recurrent seizures >> VPA level low 8/18 Off CRRT 8/19 Extubated  STUDIES: 8/14 CT ABD/Pelvis ([facility]):  no hydro, concern for L pyelo 8/14 CT Head/Neck [facility]):  no acute intracranial abnormalities, no acute injury to cervical spine 8/14 CT head: NAICP 8/14 Renal US: Normal renal cortical thickness, size and echogenicity without focal lesions or hydronephrosis 8/14 EEG: bitemporal epileptiform discharges 8/14 Echocardiogram: normal LVEF. No valvular abnormalities 8/15 MRI brain: NAD 8/17 EEG: diffuse slowing 8/17 HIT panel: negative  SUBJECTIVE:  Tolerating SBT.  Following commands better.  VITAL SIGNS: Temp:  [98.3 F (36.8 C)-100.3 F (37.9 C)] 98.3 F (36.8 C) (08/19 0755) Pulse Rate:  [90-123] 90 (08/19 0800) Resp:  [14-29] 15 (08/19 0800) BP: (101-154)/(48-80) 101/51 mmHg (08/19 0800) SpO2:  [100 %] 100 % (08/19 0800) Arterial Line BP: (97-146)/(45-76) 105/47 mmHg (08/19 0800) FiO2 (%):  [40 %] 40 % (08/19 0800) Weight:  [197 lb 1.5 oz (89.4 kg)] 197 lb 1.5 oz (89.4 kg) (08/19 0500) HEMODYNAMICS: CVP:  [5 mmHg-16 mmHg] 8 mmHg VENTILATOR SETTINGS: Vent Mode:  [-] PSV;CPAP FiO2 (%):  [40 %] 40 % Set Rate:  [14 bmp] 14 bmp PEEP:  [5 cmH20] 5 cmH20 Pressure Support:  [5 cmH20] 5 cmH20 Plateau Pressure:  [12 cmH20-15 cmH20] 15 cmH20 INTAKE / OUTPUT:  Intake/Output Summary (Last 24 hours) at  12/25/13 0915 Last data filed at 12/25/13 0800  Gross per 24 hour  Intake 1123.83 ml  Output    613 ml  Net 510.83 ml    PHYSICAL EXAMINATION: General: no distress Neuro: alert, follows simple commands, moves all extremities HEENT: ETT in place Cardiovascular: regular, tachycardic Lungs: no wheeze Abdomen: soft, non tender Ext: 1+ edema Skin: no rashes  LABS: CBC Recent Labs     12/23/13  0400  12/24/13  0400  12/25/13  0500  WBC  4.4  4.5  5.1  HGB  8.3*  8.5*  8.4*  HCT  25.4*  26.2*  26.2*  PLT  55*  48*  55*    Coag's Recent Labs     12/23/13  0400  12/24/13  0400  APTT  71*  33    BMET Recent Labs     12/23/13  1613  12/24/13  0400  12/25/13  0400  NA  137  138  142  K  4.3  4.2  4.1  CL  102  103  104  CO2  27  26  27   BUN  12  12  31*  CREATININE  1.47*  1.59*  3.04*  GLUCOSE  81  84  106*    Electrolytes Recent Labs     12/23/13  0400  12/23/13  1613  12/24/13  0400  12/25/13  0400  CALCIUM  8.0*  8.0*  8.0*  8.3*  MG  2.3   --   2.3   --   PHOS  1.8*  1.9*   --  3.7   Liver Enzymes Recent Labs     12/23/13  0400  12/23/13  1613  12/25/13  0400  AST  92*   --    --   ALT  57*   --    --   ALKPHOS  118*   --    --   BILITOT  0.3   --    --   ALBUMIN  1.6*  1.6*  1.5*   Glucose Recent Labs     12/24/13  1225  12/24/13  1640  12/24/13  1923  12/24/13  2345  12/25/13  0402  12/25/13  0752  GLUCAP  79  109*  113*  130*  116*  111*    Imaging Dg Chest Port 1 View  12/25/2013   CLINICAL DATA:  Assess support tubes and lines  EXAM: PORTABLE CHEST - 1 VIEW  COMPARISON:  Portable chest of 24 December 2013  FINDINGS: The lungs remain hypoinflated. The perihilar lung markings are coarse. The cardiac silhouette is normal where visualized. There is no pleural effusion.  The endotracheal tube tip lies 3.8 cm above the crotch of the carina. The orogastric tube tip projects off the inferior margin of the image. The right internal  jugular venous catheter tip lies in the midportion of the SVC.  IMPRESSION: The support tubes and lines are in appropriate position. There is stable hypoinflation with coarse perihilar interstitial markings which are slightly less conspicuous today.   Electronically Signed   By: David  Swazil[place]   On: 12/25/2013 07:45   Dg Chest Port 1 View  12/24/2013   CLINICAL DATA:  Atelectasis, followup, intubation, history smoking, COPD  EXAM: PORTABLE CHEST - 1 VIEW  COMPARISON:  Portable exam 0559 hr compared 12/23/2013  FINDINGS: Tip of endotracheal tube projects 1.5 cm above carina.  Nasogastric tube coiled in stomach with tip at proximal stomach.  RIGHT jugular dual-lumen central venous catheter with tip projecting over mid SVC.  Upper normal heart size.  Normal mediastinal contours.  Peribronchial thickening with bibasilar atelectasis.  No definite infiltrate, pleural effusion or pneumothorax.  IMPRESSION: Bronchitic changes with bibasilar atelectasis.   Electronically Signed   By: Ulyses SouthwardMark  Boles M.D.   On: 12/24/2013 08:04    ASSESSMENT / PLAN:  PULMONARY ETT 8/14 >> 8/19  A: Acute respiratory failure 2nd to septic shock, altered mentals status. Hx of smoking and reported hx of COPD. Hx of OSA. P:   Proceed with extubation 8/19 CPAP qhs F/u CXR PRN BD's  CARDIOVASCULAR CVL R IJ HD 8/14 >>  R fem art line 8/14 >> 8/19  A:  Septic shock >> resolved 8/15. Bradycardia >> resolved Sinus tachycardia in setting of sepsis. P:  Monitor hemodynamics PRN lopressor for HR > 115  RENAL A:   AKI, anuric 2nd to pyelonephritis, septic shock, myelopathy and neurogenic bladder. P:   Per renal  GASTROINTESTINAL A:   Diarrhea, C diff negative 8/14 >> improved. Nutrition. Dysphagia. P:   NPO Speech therapy to assess swallowing after extubation Protonix for SUP  HEMATOLOGIC A:   Anemia of critical illness. Thrombocytopenia developed 8/14 >> baseline PLT 248 from 12/07/13 >> HIT panel negative from  8/17. P:  F/u CBC SCD for DVT prevention  INFECTIOUS A:   Septic shock 2nd to pyelonephritis; multiple organism in urine cx 8/14. P:   Day 6/7 of Abx, currently on zosyn  Blood 8/14 >>   ENDOCRINE A:   Hyperglycemia, resolved.  P:   D/c  CBG checks  NEUROLOGIC A:   Acute Encephalopathy 2nd to seizures, sepsis, AKI >> continues to improve.  P:   AED's per neurology  CC time 35 minutes.  Coralyn Helling, MD [facility] Pulmonary/Critical Care 12/25/2013, 9:15 AM Pager:  [phone] After 3pm call: [phone]

## 2013-12-25 NOTE — Progress Notes (Signed)
MD notified that SBP via R cuff has been dropping into 90. R Fem a-line still present with appropriate wave-form and reading systolic of 110-130. Instructed to leave a-line in for another day. Will continue to monitor. Franki CabotBrianna Francie Keeling, RN

## 2013-12-25 NOTE — Progress Notes (Addendum)
S: Patient intubated, off CVVHD, continues to respond to voice, touch but does not follow commands. O:BP 105/48  Pulse 94  Temp(Src) 98.3 F (36.8 C) (Axillary)  Resp 19  Ht 5' 1.81" (1.57 m)  Wt 197 lb 1.5 oz (89.4 kg)  BMI 36.27 kg/m2  SpO2 100%  Intake/Output Summary (Last 24 hours) at 12/25/13 0819 Last data filed at 12/25/13 0700  Gross per 24 hour  Intake 1128.83 ml  Output    621 ml  Net 507.83 ml   Intake/Output: I/O last 3 completed shifts: In: 1921.8 [I.V.:329.3; Other:160; NG/GT:845; IV Piggyback:587.5] Out: 1590 [Urine:410; Other:780; Stool:400]  Intake/Output this shift:    Weight change: 10 lb 12.8 oz (4.9 kg) ZOX:WRUEAVWUJ, lethargic but arousable and trying to verbalize CVS:RRR Resp:ronchi bilaterally WJX:[id number] bowel sounds Ext:no edema   Recent Labs Lab 12/31/2013 0815  12/21/13 0400 12/21/13 1600 12/22/13 0404 12/22/13 1645 12/23/13 0400 12/23/13 1613 12/24/13 0400 12/25/13 0400  NA 146  < > 140 138 134* 137 139 137 138 142  K 4.5  < > 5.0 4.8 4.2 4.3 4.3 4.3 4.2 4.1  CL 110  < > 104 103 101 102 104 102 103 104  CO2 21  < > 21 25 24 27 26 27 26 27   GLUCOSE 140*  < > 106* 102* 116* 88 83 81 84 106*  BUN 86*  < > 40* 26* 17 14 13 12 12  31*  CREATININE 7.64*  < > 3.86* 2.89* 1.94* 1.59* 1.66* 1.47* 1.59* 3.04*  ALBUMIN 1.5*  < > 1.8* 1.8* 1.5* 1.7* 1.6* 1.6*  --  1.5*  CALCIUM 6.2*  < > 7.8* 7.9* 7.9* 8.1* 8.0* 8.0* 8.0* 8.3*  PHOS  --   < > 3.6 3.7 2.9 2.0* 1.8* 1.9*  --  3.7  AST 19  --  30  --  87*  --  92*  --   --   --   ALT 22  --  34  --  47  --  57*  --   --   --   < > = values in this interval not displayed. Liver Function Tests:  Recent Labs Lab 12/21/13 0400  12/22/13 0404  12/23/13 0400 12/23/13 1613 12/25/13 0400  AST 30  --  87*  --  92*  --   --   ALT 34  --  47  --  57*  --   --   ALKPHOS 164*  --  130*  --  118*  --   --   BILITOT 0.4  --  0.4  --  0.3  --   --   PROT 5.7*  --  5.1*  --  5.0*  --   --   ALBUMIN  1.8*  < > 1.5*  < > 1.6* 1.6* 1.5*  < > = values in this interval not displayed. No results found for this basename: LIPASE, AMYLASE,  in the last 168 hours No results found for this basename: AMMONIA,  in the last 168 hours CBC:  Recent Labs Lab 12/08/2013 0815 12/21/13 0400 12/22/13 0404 12/23/13 0400 12/24/13 0400 12/25/13 0500  WBC 6.5 7.7 5.9 4.4 4.5 5.1  NEUTROABS 5.3  --   --   --   --   --   HGB 8.2* 9.6* 8.9* 8.3* 8.5* 8.4*  HCT 24.5* 29.2* 27.5* 25.4* 26.2* 26.2*  MCV 96.8 97.7 100.0 99.6 101.6* 102.3*  PLT 106* 103* 70* 55* 48* 55*   Cardiac Enzymes:  Recent Labs Lab 12/19/2013 0815  TROPONINI <0.30   CBG:  Recent Labs Lab 12/24/13 1225 12/24/13 1640 12/24/13 1923 12/24/13 2345 12/25/13 0402  GLUCAP 79 109* 113* 130* 116*    Iron Studies: No results found for this basename: IRON, TIBC, TRANSFERRIN, FERRITIN,  in the last 72 hours Studies/Results: Dg Chest Port 1 View  12/25/2013   CLINICAL DATA:  Assess support tubes and lines  EXAM: PORTABLE CHEST - 1 VIEW  COMPARISON:  Portable chest of 24 December 2013  FINDINGS: The lungs remain hypoinflated. The perihilar lung markings are coarse. The cardiac silhouette is normal where visualized. There is no pleural effusion.  The endotracheal tube tip lies 3.8 cm above the crotch of the carina. The orogastric tube tip projects off the inferior margin of the image. The right internal jugular venous catheter tip lies in the midportion of the SVC.  IMPRESSION: The support tubes and lines are in appropriate position. There is stable hypoinflation with coarse perihilar interstitial markings which are slightly less conspicuous today.   Electronically Signed   By: David  [place]   On: 12/25/2013 07:45   Dg Chest Port 1 View  12/24/2013   CLINICAL DATA:  Atelectasis, followup, intubation, history smoking, COPD  EXAM: PORTABLE CHEST - 1 VIEW  COMPARISON:  Portable exam 0559 hr compared 12/23/2013  FINDINGS: Tip of endotracheal tube  projects 1.5 cm above carina.  Nasogastric tube coiled in stomach with tip at proximal stomach.  RIGHT jugular dual-lumen central venous catheter with tip projecting over mid SVC.  Upper normal heart size.  Normal mediastinal contours.  Peribronchial thickening with bibasilar atelectasis.  No definite infiltrate, pleural effusion or pneumothorax.  IMPRESSION: Bronchitic changes with bibasilar atelectasis.   Electronically Signed   By: Ulyses Southward M.D.   On: 12/24/2013 08:04   . antiseptic oral rinse  7 mL Mouth Rinse QID  . chlorhexidine  15 mL Mouth Rinse BID  . feeding supplement (NEPRO CARB STEADY)  1,000 mL Oral Q24H  . feeding supplement (PRO-STAT SUGAR FREE 64)  60 mL Per Tube TID  . pantoprazole sodium  40 mg Per Tube Q24H  . piperacillin-tazobactam (ZOSYN)  IV  2.25 g Intravenous 4 times per day  . sodium chloride  10-40 mL Intracatheter Q12H  . sodium chloride  3 mL Intravenous Q12H  . valproate sodium  750 mg Intravenous 3 times per day    BMET    Component Value Date/Time   NA 142 12/25/2013 0400   K 4.1 12/25/2013 0400   CL 104 12/25/2013 0400   CO2 27 12/25/2013 0400   GLUCOSE 106* 12/25/2013 0400   BUN 31* 12/25/2013 0400   CREATININE 3.04* 12/25/2013 0400   CALCIUM 8.3* 12/25/2013 0400   GFRNONAA 23* 12/25/2013 0400   GFRAA 27* 12/25/2013 0400   CBC    Component Value Date/Time   WBC 5.1 12/25/2013 0500   RBC 2.56* 12/25/2013 0500   RBC 4.28 09/21/2012 0436   HGB 8.4* 12/25/2013 0500   HCT 26.2* 12/25/2013 0500   PLT 55* 12/25/2013 0500   MCV 102.3* 12/25/2013 0500   MCH 32.8 12/25/2013 0500   MCHC 32.1 12/25/2013 0500   RDW 14.6 12/25/2013 0500   LYMPHSABS 0.7 12/10/2013 0815   MONOABS 0.4 12/22/2013 0815   EOSABS 0.0 12/27/2013 0815   BASOSABS 0.0 12/07/2013 0815   Blood Cx 12/30/2013: 2/2 NGTD Urine Cx 01/03/2014: Multiple morphotypes present Respiratory Cx 12/24/2013: Non pathogenic oropharyngeal type flora   Assessment/Plan:  1.  Acute Renal Failure/ Oliguric- No hydronephrosis  from renal ultra sound although previous Lumbar MRI shows distended bladder (likely neurogenic bladder), UOP 250 cc in last 24 hours, will continue with IHD as needed but currently no indication at this time.  Will cont to follow UOP and daily labs and perform HD if no significant improvement in UOP. 2. Sepsis- due to pyelonephritis, on Zosyn 3. Thrombocytopenia- Platelets trended up to 55 today, HIT panel negative 4. Acute Encephalopathy/ Seizures: mental status precluding extubation  Gust RungHoffman, Erik C Internal Medicine PGY2 Pager [phone]  I have seen and examined this patient and agree with plan as outlined by Dr. Mikey BussingHoffman except for additions/corrections above.  Pt is now extubated and minimally responsive and trying to verbalize.  Cont to follow closely but will likely require IHD until he starts to have some renal recovery. Naelani Lafrance A,MD 12/25/2013 11:03 AM

## 2013-12-25 NOTE — Progress Notes (Signed)
Subjective: A recurrent seizure activity reported. Patient remains intubated and on mechanical ventilation.  Objective: Current vital signs: BP 119/54  Pulse 105  Temp(Src) 98.3 F (36.8 C) (Axillary)  Resp 23  Ht 5' 1.81" (1.57 m)  Wt 89.4 kg (197 lb 1.5 oz)  BMI 36.27 kg/m2  SpO2 100%  Neurologic Exam: Patient was easily to arouse and was able to follow commands. He was intubated with spontaneous respirations. Pupils were equal and reacted normally to light. Extraocular movements were full and conjugate. No facial weakness was noted. Hand grip strength was good bilaterally. Lower extremities were markedly spastic but was exacerbated by movement or stimulation. Plantar responses were extensor bilaterally.  Medications: I have reviewed the patient's current medications.  Assessment/Plan: Encephalopathic state secondary to acute on chronic renal failure as well as sepsis. Seizure activity appears to be controlled at this point. Depakote level is pending.  Recommend no changes in current management. We will continue to follow this patient with you.  C.R. Roseanne RenoStewart, MD [facility] [phone]  12/25/2013  10:10 AM

## 2013-12-26 DIAGNOSIS — G4733 Obstructive sleep apnea (adult) (pediatric): Secondary | ICD-10-CM

## 2013-12-26 DIAGNOSIS — J449 Chronic obstructive pulmonary disease, unspecified: Secondary | ICD-10-CM

## 2013-12-26 LAB — CBC
HEMATOCRIT: 25.3 % — AB (ref 39.0–52.0)
HEMOGLOBIN: 8.2 g/dL — AB (ref 13.0–17.0)
MCH: 33.3 pg (ref 26.0–34.0)
MCHC: 32.4 g/dL (ref 30.0–36.0)
MCV: 102.8 fL — ABNORMAL HIGH (ref 78.0–100.0)
Platelets: 70 10*3/uL — ABNORMAL LOW (ref 150–400)
RBC: 2.46 MIL/uL — AB (ref 4.22–5.81)
RDW: 14.4 % (ref 11.5–15.5)
WBC: 5.8 10*3/uL (ref 4.0–10.5)

## 2013-12-26 LAB — BASIC METABOLIC PANEL
ANION GAP: 12 (ref 5–15)
BUN: 53 mg/dL — ABNORMAL HIGH (ref 6–23)
CHLORIDE: 103 meq/L (ref 96–112)
CO2: 24 mEq/L (ref 19–32)
Calcium: 8.4 mg/dL (ref 8.4–10.5)
Creatinine, Ser: 5 mg/dL — ABNORMAL HIGH (ref 0.50–1.35)
GFR calc non Af Amer: 13 mL/min — ABNORMAL LOW (ref >90)
GFR, EST AFRICAN AMERICAN: 15 mL/min — AB (ref >90)
Glucose, Bld: 70 mg/dL (ref 70–99)
POTASSIUM: 3.8 meq/L (ref 3.7–5.3)
Sodium: 139 mEq/L (ref 137–147)

## 2013-12-26 LAB — CULTURE, BLOOD (ROUTINE X 2)
Culture: NO GROWTH
Culture: NO GROWTH

## 2013-12-26 LAB — GLUCOSE, CAPILLARY: GLUCOSE-CAPILLARY: 84 mg/dL (ref 70–99)

## 2013-12-26 MED ORDER — HEPARIN SODIUM (PORCINE) 5000 UNIT/ML IJ SOLN
5000.0000 [IU] | Freq: Three times a day (TID) | INTRAMUSCULAR | Status: DC
  Administered 2013-12-26 – 2014-01-03 (×24): 5000 [IU] via SUBCUTANEOUS
  Filled 2013-12-26 (×25): qty 1

## 2013-12-26 NOTE — Progress Notes (Signed)
Pt confused, sats are 100 and pt is stable. CPAP will not be brought to bedside unless needed. RN notified and knows to call respiratory if any assistance is needed. RT will continue to monitor.

## 2013-12-26 NOTE — Evaluation (Signed)
Physical Therapy Evaluation Patient Details Name: [name] MRN: [id number] DOB: 15-Sep-1965 Today's Date: 12/26/2013   History of Present Illness  This 48 y.o. male admitted 8/14 from [facility] with fever and AMS.   Pt was unable to protect his airway and was intubated.   Dx: pyelonephritis; r/o aspiration PNA; encephalopathy.  Pt extubated 12/25/13.  PMH:  h/o developmental disability; COPD; sleep apnea.   Multiple ED visits/admits since 7/15 for falls.   Clinical Impression  Pt admitted with above. Pt currently with functional limitations due to the deficits listed below (see PT Problem List).  Pt will benefit from skilled PT to increase their independence and safety with mobility to allow discharge to the venue listed below. Pt severely limited by weakness/spasticity of extremities of unclear etiology.      Follow Up Recommendations SNF    Equipment Recommendations  None recommended by PT    Recommendations for Other Services       Precautions / Restrictions Precautions Precautions: Fall Precaution Comments: h/o mult falls at  ALF Restrictions Weight Bearing Restrictions: No      Mobility  Bed Mobility Overal bed mobility: +2 for physical assistance;Needs Assistance Bed Mobility: Supine to Sit;Sit to Supine     Supine to sit: Total assist;+2 for physical assistance Sit to supine: Total assist;+2 for physical assistance   General bed mobility comments: Pt able to assist minimally.  Pt with severe spasms bil. LEs with pt grimmacing and indicating pain.    Transfers                 General transfer comment: unable to attempt due to severity of LE spasms and pain present  Ambulation/Gait                Stairs            Wheelchair Mobility    Modified Rankin (Stroke Patients Only)       Balance Overall balance assessment: Needs assistance Sitting-balance support: Single extremity supported;Feet supported Sitting balance-Leahy  Scale: Poor Sitting balance - Comments: requires mod A - max A to sit EOB.  Pt maintains flexed posture Postural control: Right lateral lean                                   Pertinent Vitals/Pain Pain Assessment: Faces Pain Score: 8  Faces Pain Scale: Hurts whole lot Pain Location: bil. LEs = spasms noted with any attempts to tocuh/move LEs Pain Intervention(s): Limited activity within patient's tolerance;Monitored during session;Repositioned (RN notified)    Home Living Family/patient expects to be discharged to:: Skilled nursing facility                 Additional Comments: Pt lives at [facility].  Per chart review, pt was ambulatory with RW until 7/15.  He fell out of bed and then had subsequent falls with ED visits and has been using a w/c since that time due to safety issues.     Prior Function Level of Independence: Needs assistance   Gait / Transfers Assistance Needed: per chart review, pt has been using w/c and was +2 assist for transfers last admission.   ADL's / Homemaking Assistance Needed: uncertain as pt unable to provide information.  Pt requires +2 assist for transfers so anticipate he has required max - total A for LB ADLs and toilet transfers.  Per therapy notes in 7/15, pt  was modified independent with BADLs prior to that time.         Hand Dominance   Dominant Hand: Right    Extremity/Trunk Assessment   Upper Extremity Assessment: RUE deficits/detail;LUE deficits/detail RUE Deficits / Details: Pt maintains elbows flexed with rigidity/?spasms noted.  With assist and verbal cues, he is able to assist with movements of UEs to bring hand to mouth and to make a fist.  Could/did not attempt to lift UE overhead on command.  Movement appears very weak, but difficult to definitively assess due to lethargy      LUE Deficits / Details: Pt maintains UEs with elbow flexion.  WIth assist and max verbal cues, he is able to assist with moving hand to  mouth, making a fist, and raising UE overhead.   Movement appears very weak.  Initially Lt. UE appeared rigid/?spasticity.   Difficult to asses level of weakness/strength present due to lethargy    Lower Extremity Assessment: RLE deficits/detail;LLE deficits/detail RLE Deficits / Details: Pt with significant spasticity pulling his legs into flexion. With steady pressure able to achieve passive hip/knee extension to ~30 degrees from neutral while pt in supine. No volitional movement noted. LLE Deficits / Details: Pt with significant spasticity pulling his legs into flexion. With steady pressure able to achieve passive hip/knee extension to ~30 degrees from neutral while pt in supine. No volitional movement noted.  Cervical / Trunk Assessment: Other exceptions  Communication   Communication: Other (comment) (Very few verbalizations. Difficult to understand. )  Cognition Arousal/Alertness: Lethargic Behavior During Therapy: Flat affect Overall Cognitive Status: Difficult to assess                      General Comments      Exercises        Assessment/Plan    PT Assessment Patient needs continued PT services  PT Diagnosis Generalized weakness;Acute pain;Altered mental status;Difficulty walking   PT Problem List Decreased range of motion;Decreased strength;Decreased activity tolerance;Decreased safety awareness;Decreased mobility;Pain;Decreased balance  PT Treatment Interventions DME instruction;Functional mobility training;Therapeutic activities;Therapeutic exercise;Balance training;Patient/family education   PT Goals (Current goals can be found in the Care Plan section) Acute Rehab PT Goals Patient Stated Goal: Pt unable PT Goal Formulation: Patient unable to participate in goal setting Time For Goal Achievement: 01/09/14 Potential to Achieve Goals: Fair    Frequency Min 2X/week   Barriers to discharge        Co-evaluation PT/OT/SLP Co-Evaluation/Treatment: Yes Reason  for Co-Treatment: Complexity of the patient's impairments (multi-system involvement);For patient/therapist safety PT goals addressed during session: Mobility/safety with mobility;Balance;Strengthening/ROM         End of Session   Activity Tolerance: Patient limited by lethargy Patient left: in bed;with call bell/phone within reach Nurse Communication: Mobility status         Time: 1135-1155 PT Time Calculation (min): 20 min   Charges:   PT Evaluation $Initial PT Evaluation Tier I: 1 Procedure     PT G Codes:          Aleila Syverson 12/26/2013, 2:22 PM  [facility] [phone]

## 2013-12-26 NOTE — Progress Notes (Signed)
PULMONARY / CRITICAL CARE MEDICINE   Name: [name] MRN: [id number] DOB: 1966-03-06    ADMISSION DATE:  2013-12-31 CONSULTATION DATE:  06/02/2013  REFERRING MD :  Dr. Allena KatzPatel   CHIEF COMPLAINT:  AKI, AMS, Acute Resp Fx  INITIAL PRESENTATION: 48 yr old male admitted pyelo, septic shock, ARF  STUDIES/SIGNIFICANT EVENTS:  8/14 [facility] with fever, AMS, hypotension. Intubated for airway protection. Vasopressors initiated 8/14 Neuro consult: Valproic acid initiated 8/14 Renal consult: CRRT initiated 8/15 Vasopressors weaned to off 8/16 ?recurrent seizures >> VPA level low 8/18 Off CRRT 8/19 Extubated 8/20 Transfer to SDU  STUDIES: 8/14 CT ABD/Pelvis ([facility]):  no hydro, concern for L pyelo 8/14 CT Head/Neck [facility]):  no acute intracranial abnormalities, no acute injury to cervical spine 8/14 CT head: NAICP 8/14 Renal US: Normal renal cortical thickness, size and echogenicity without focal lesions or hydronephrosis 8/14 EEG: bitemporal epileptiform discharges 8/14 Echocardiogram: normal LVEF. No valvular abnormalities 8/15 MRI brain: NAD 8/17 EEG: diffuse slowing 8/17 HIT panel: negative  SUBJECTIVE:  Did well with CPAP overnight.  VITAL SIGNS: Temp:  [97 F (36.1 C)-98.3 F (36.8 C)] 97 F (36.1 C) (08/20 0400) Pulse Rate:  [90-115] 96 (08/20 0600) Resp:  [12-23] 14 (08/20 0600) BP: (87-133)/(37-82) 101/55 mmHg (08/20 0600) SpO2:  [97 %-100 %] 100 % (08/20 0600) Arterial Line BP: (95-157)/(41-75) 121/53 mmHg (08/20 0600) FiO2 (%):  [40 %] 40 % (08/19 0800) Weight:  [193 lb 9 oz (87.8 kg)] 193 lb 9 oz (87.8 kg) (08/20 0500) INTAKE / OUTPUT:  Intake/Output Summary (Last 24 hours) at 12/26/13 0653 Last data filed at 12/26/13 0634  Gross per 24 hour  Intake    600 ml  Output    700 ml  Net   -100 ml    PHYSICAL EXAMINATION: General: no distress Neuro: alert, follows simple commands, moves all extremities HEENT: CPAP mask  on Cardiovascular: regular, tachycardic Lungs: no wheeze Abdomen: soft, non tender Ext: no edema Skin: no rashes  LABS: CBC Recent Labs     12/24/13  0400  12/25/13  0500  12/26/13  0420  WBC  4.5  5.1  5.8  HGB  8.5*  8.4*  8.2*  HCT  26.2*  26.2*  25.3*  PLT  48*  55*  70*    Coag's Recent Labs     12/24/13  0400  APTT  33    BMET Recent Labs     12/24/13  0400  12/25/13  0400  12/26/13  0420  NA  138  142  139  K  4.2  4.1  3.8  CL  103  104  103  CO2  26  27  24   BUN  12  31*  53*  CREATININE  1.59*  3.04*  5.00*  GLUCOSE  84  106*  70    Electrolytes Recent Labs     12/23/13  1613  12/24/13  0400  12/25/13  0400  12/26/13  0420  CALCIUM  8.0*  8.0*  8.3*  8.4  MG   --   2.3   --    --   PHOS  1.9*   --   3.7   --    Liver Enzymes Recent Labs     12/23/13  1613  12/25/13  0400  ALBUMIN  1.6*  1.5*   Glucose Recent Labs     12/24/13  1640  12/24/13  1923  12/24/13  2345  12/25/13  0402  12/25/13  0752  12/25/13  1254  GLUCAP  109*  113*  130*  116*  111*  93    Imaging Dg Chest Port 1 View  12/25/2013   CLINICAL DATA:  Assess support tubes and lines  EXAM: PORTABLE CHEST - 1 VIEW  COMPARISON:  Portable chest of 24 December 2013  FINDINGS: The lungs remain hypoinflated. The perihilar lung markings are coarse. The cardiac silhouette is normal where visualized. There is no pleural effusion.  The endotracheal tube tip lies 3.8 cm above the crotch of the carina. The orogastric tube tip projects off the inferior margin of the image. The right internal jugular venous catheter tip lies in the midportion of the SVC.  IMPRESSION: The support tubes and lines are in appropriate position. There is stable hypoinflation with coarse perihilar interstitial markings which are slightly less conspicuous today.   Electronically Signed   By: David  [place]   On: 12/25/2013 07:45    ASSESSMENT / PLAN:  PULMONARY ETT 8/14 >> 8/19  A: Acute respiratory  failure 2nd to septic shock, altered mentals status. Hx of smoking and reported hx of COPD. Hx of OSA. P:   Oxygen to keep SpO2 > 92% CPAP qhs F/u CXR as needed PRN BD's  CARDIOVASCULAR CVL R IJ HD 8/14 >>  R fem art line 8/14 >> 8/20  A:  Septic shock >> resolved 8/15. Bradycardia >> resolved Sinus tachycardia in setting of sepsis. P:  Monitor hemodynamics PRN lopressor for HR > 115  RENAL A:   AKI, anuric 2nd to pyelonephritis, septic shock, myelopathy and neurogenic bladder. P:   HD per renal Keep foley in for now  GASTROINTESTINAL A:   Diarrhea, C diff negative 8/14 >> improved. Nutrition. Dysphagia. P:   NPO Speech therapy to assess swallowing before advancing diet Protonix for SUP  HEMATOLOGIC A:   Anemia of critical illness. Thrombocytopenia developed 8/14 >> baseline PLT 248 from 12/07/13 >> HIT panel negative from 8/17. P:  F/u CBC Add SQ heparin for DVT prevention 8/20 since PLT count improving  INFECTIOUS A:   Septic shock 2nd to pyelonephritis; multiple organism in urine cx 8/14. P:   Day 7/7 of Abx, currently on zosyn >> d/c after dose on 8/20  Blood 8/14 >>   ENDOCRINE A:   Hyperglycemia, resolved.  P:   Monitor blood sugar on BMET  NEUROLOGIC A:   Acute Encephalopathy 2nd to seizures, sepsis, AKI >> continues to improve.  Deconditioning. P:   AED's per neurology Mobilize, PT/OT consult  Summary: Mental status continues to slowly improve.  Will transition to SDU.  Will ask [facility] to assume care from 8/21 and PCCM sign off.  Coralyn Helling, MD [facility] Pulmonary/Critical Care 12/26/2013, 6:53 AM Pager:  [phone] After 3pm call: [phone]

## 2013-12-26 NOTE — Progress Notes (Signed)
S: Patient on CPAP. Attempts to verbalize.  To be transferred to SDU. O:BP 101/55  Pulse 96  Temp(Src) 97.7 F (36.5 C) (Axillary)  Resp 14  Ht 5' 1.81" (1.57 m)  Wt 193 lb 9 oz (87.8 kg)  BMI 35.62 kg/m2  SpO2 100%  Intake/Output Summary (Last 24 hours) at 12/26/13 0932 Last data filed at 12/26/13 0658  Gross per 24 hour  Intake  582.5 ml  Output    590 ml  Net   -7.5 ml   Intake/Output: I/O last 3 completed shifts: In: 1190.8 [I.V.:348.3; NG/GT:255; IV Piggyback:587.5] Out: 880 [Urine:580; Stool:300]  Intake/Output this shift:    Weight change: -3 lb 8.4 oz (-1.6 kg) Gen:on CPAP CVS:RRR Resp:rmild onchi bilaterally ZOX:[id number] bowel sounds, nontender Ext:no edema   Recent Labs Lab 12/07/2013 0815  12/21/13 0400 12/21/13 1600 12/22/13 0404 12/22/13 1645 12/23/13 0400 12/23/13 1613 12/24/13 0400 12/25/13 0400 12/26/13 0420  NA 146  < > 140 138 134* 137 139 137 138 142 139  K 4.5  < > 5.0 4.8 4.2 4.3 4.3 4.3 4.2 4.1 3.8  CL 110  < > 104 103 101 102 104 102 103 104 103  CO2 21  < > 21 25 24 27 26 27 26 27 24   GLUCOSE 140*  < > 106* 102* 116* 88 83 81 84 106* 70  BUN 86*  < > 40* 26* 17 14 13 12 12  31* 53*  CREATININE 7.64*  < > 3.86* 2.89* 1.94* 1.59* 1.66* 1.47* 1.59* 3.04* 5.00*  ALBUMIN 1.5*  < > 1.8* 1.8* 1.5* 1.7* 1.6* 1.6*  --  1.5*  --   CALCIUM 6.2*  < > 7.8* 7.9* 7.9* 8.1* 8.0* 8.0* 8.0* 8.3* 8.4  PHOS  --   < > 3.6 3.7 2.9 2.0* 1.8* 1.9*  --  3.7  --   AST 19  --  30  --  87*  --  92*  --   --   --   --   ALT 22  --  34  --  47  --  57*  --   --   --   --   < > = values in this interval not displayed. Liver Function Tests:  Recent Labs Lab 12/21/13 0400  12/22/13 0404  12/23/13 0400 12/23/13 1613 12/25/13 0400  AST 30  --  87*  --  92*  --   --   ALT 34  --  47  --  57*  --   --   ALKPHOS 164*  --  130*  --  118*  --   --   BILITOT 0.4  --  0.4  --  0.3  --   --   PROT 5.7*  --  5.1*  --  5.0*  --   --   ALBUMIN 1.8*  < > 1.5*  < > 1.6*  1.6* 1.5*  < > = values in this interval not displayed. No results found for this basename: LIPASE, AMYLASE,  in the last 168 hours No results found for this basename: AMMONIA,  in the last 168 hours CBC:  Recent Labs Lab 01/01/2014 0815  12/22/13 0404 12/23/13 0400 12/24/13 0400 12/25/13 0500 12/26/13 0420  WBC 6.5  < > 5.9 4.4 4.5 5.1 5.8  NEUTROABS 5.3  --   --   --   --   --   --   HGB 8.2*  < > 8.9* 8.3* 8.5* 8.4* 8.2*  HCT  24.5*  < > 27.5* 25.4* 26.2* 26.2* 25.3*  MCV 96.8  < > 100.0 99.6 101.6* 102.3* 102.8*  PLT 106*  < > 70* 55* 48* 55* 70*  < > = values in this interval not displayed. Cardiac Enzymes:  Recent Labs Lab 12/15/2013 0815  TROPONINI <0.30   CBG:  Recent Labs Lab 12/24/13 2345 12/25/13 0402 12/25/13 0752 12/25/13 1254 12/26/13 0746  GLUCAP 130* 116* 111* 93 84    Iron Studies: No results found for this basename: IRON, TIBC, TRANSFERRIN, FERRITIN,  in the last 72 hours Studies/Results: Dg Chest Port 1 View  12/25/2013   CLINICAL DATA:  Assess support tubes and lines  EXAM: PORTABLE CHEST - 1 VIEW  COMPARISON:  Portable chest of 24 December 2013  FINDINGS: The lungs remain hypoinflated. The perihilar lung markings are coarse. The cardiac silhouette is normal where visualized. There is no pleural effusion.  The endotracheal tube tip lies 3.8 cm above the crotch of the carina. The orogastric tube tip projects off the inferior margin of the image. The right internal jugular venous catheter tip lies in the midportion of the SVC.  IMPRESSION: The support tubes and lines are in appropriate position. There is stable hypoinflation with coarse perihilar interstitial markings which are slightly less conspicuous today.   Electronically Signed   By: David  [place]   On: 12/25/2013 07:45   . antiseptic oral rinse  7 mL Mouth Rinse QID  . chlorhexidine  15 mL Mouth Rinse BID  . heparin subcutaneous  5,000 Units Subcutaneous 3 times per day  . pantoprazole (PROTONIX) IV   40 mg Intravenous Q24H  . piperacillin-tazobactam (ZOSYN)  IV  2.25 g Intravenous 4 times per day  . sodium chloride  10-40 mL Intracatheter Q12H  . sodium chloride  3 mL Intravenous Q12H  . valproate sodium  750 mg Intravenous 3 times per day    BMET    Component Value Date/Time   NA 139 12/26/2013 0420   K 3.8 12/26/2013 0420   CL 103 12/26/2013 0420   CO2 24 12/26/2013 0420   GLUCOSE 70 12/26/2013 0420   BUN 53* 12/26/2013 0420   CREATININE 5.00* 12/26/2013 0420   CALCIUM 8.4 12/26/2013 0420   GFRNONAA 13* 12/26/2013 0420   GFRAA 15* 12/26/2013 0420   CBC    Component Value Date/Time   WBC 5.8 12/26/2013 0420   RBC 2.46* 12/26/2013 0420   RBC 4.28 09/21/2012 0436   HGB 8.2* 12/26/2013 0420   HCT 25.3* 12/26/2013 0420   PLT 70* 12/26/2013 0420   MCV 102.8* 12/26/2013 0420   MCH 33.3 12/26/2013 0420   MCHC 32.4 12/26/2013 0420   RDW 14.4 12/26/2013 0420   LYMPHSABS 0.7 12/28/2013 0815   MONOABS 0.4 01/01/2014 0815   EOSABS 0.0 12/26/2013 0815   BASOSABS 0.0 12/29/2013 0815   Blood Cx 12/22/2013: 2/2 NGTD Urine Cx 12/09/2013: Multiple morphotypes present Respiratory Cx 12/15/2013: Non pathogenic oropharyngeal type flora   Assessment/Plan:  1. Acute Renal Failure/ Oliguric- No hydronephrosis from renal ultra sound although previous Lumbar MRI shows distended bladder (likely neurogenic bladder), UOP improving 440 cc in last 24 hours, SCr continues to rise but no Uremic symptoms.  Will go for IHD as needed. 2. Sepsis- due to pyelonephritis, on Zosyn 3. Thrombocytopenia- Platelets trended up to 70 today, HIT panel negative, Heparin for DVT ppx resumed. 4. Acute Encephalopathy/ Seizures: mildly improved. Neurology following  Gust RungHoffman, Erik C Internal Medicine PGY2 Pager [phone] I have seen and  examined this patient and agree with plan as outlined by Dr. Mikey Bussing.  Will hold off on HD for now and hopefully his UOP will cont to increase. Kissy Cielo A,MD 12/26/2013 11:03 AM

## 2013-12-26 NOTE — Clinical Social Work Psychosocial (Signed)
Clinical Social Work Department BRIEF PSYCHOSOCIAL ASSESSMENT 12/26/2013  Patient:  [name],[name]     Account Number:  [id number]     Admit date:  11/28/2013  Clinical Social Worker:  [name],[name], LCSWA  Date/Time:  12/26/2013 02:23 PM  Referred by:  Physician  Date Referred:  12/26/2013 Referred for  SNF Placement   Other Referral:   none.   Interview type:  Family Other interview type:   CSW spoke with pt'Cummings sister, [name], regarding discharge disposition.    PSYCHOSOCIAL DATA Living Status:  FACILITY [facility] Level of care:  Skilled Nursing Facility Primary support name:  [name] Primary support relationship to patient:  SIBLING Degree of support available:   Strong support system.    CURRENT CONCERNS Current Concerns  Post-Acute Placement   Other Concerns:   none.    SOCIAL WORK ASSESSMENT / PLAN CSW contacted pt'Cummings sister regarding discharge disposition. Pt'Cummings sister informed CS[facility] and family wishes for [facility] once medically stable for discharge.    CSW to continue to follow and assist with discharge planning needs.   Assessment/plan status:  Psychosocial Support/Ongoing Assessment of Needs Other assessment/ plan:   none.   Information/referral to community resources:   [facility]    PATIENT'Cummings/FAMILY'Cummings RESPONSE TO PLAN OF CARE: Pt'Cummings sister understanding and agreeable to CSW plan of care. Pt'Cummings sister expressed no further questions or concerns at this time.       [name], MSW, [facility]WA Licensed Clinical Social Worker [phone] and [phone]

## 2013-12-26 NOTE — Evaluation (Signed)
Clinical/Bedside Swallow Evaluation Patient Details  Name: [name] MRN: [id number] Date of Birth: 1966/01/29  Today's Date: 12/26/2013 Time: 1478-2956 SLP Time Calculation (min): 16 min  Past Medical History:  Past Medical History  Diagnosis Date  . Acute respiratory failure   . Hypoxemia   . Encephalopathy, unspecified   . Pain in limb   . Hyperosmolality and/or hypernatremia   . Dehydration   . Altered mental status   . Other specific developmental learning difficulties   . Acidosis   . Gallstone ileus   . Unspecified vitamin B deficiency   . Tobacco use disorder   . Acute conjunctivitis, unspecified   . Other chronic allergic conjunctivitis   . Unspecified essential hypertension   . Cellulitis and abscess of unspecified site   . Muscle weakness (generalized)   . Cognitive communication deficit   . COPD (chronic obstructive pulmonary disease)   . Sleep apnea   . 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 Acute respiratory failure with hypoxia 09/20/2012  . Acute respiratory acidosis 09/20/2012  . Cellulitis of left lower extremity 09/20/2012  . OSA (obstructive sleep apnea) 09/20/2012  . Lumbosacral spondylosis without myelopathy 12/03/2013  . Degenerative joint disease of spine 12/03/2013  . Cervical spondylosis with myelopathy 12/03/2013   Past Surgical History:  Past Surgical History  Procedure Laterality Date  . Eye surgery      cataracts  . Radiology with anesthesia N/A 12/09/2013    Procedure: MRI OF LUMBAR W/WO CONTRAST ;  Surgeon: Medication Radiologist, MD;  Location: [facility] OR;  Service: Radiology;  Laterality: N/A;   HPI:  Pt is a 48 year old male with a history of developmental disability, admitted with pyelonephritis, septic shock and ARF. Pt intubated on 8/14, extubated 8/19 and having BiPAP overnight. Pt has had recurrent seizures and marked spasticity and discomfort of lower extremities with spasms. MD reports dysphagia in note, ?history. NO history of seeing  SLP in notes.    Assessment / Plan / Recommendation Clinical Impression  Pt demonstrates evidence concerning for a moderate to severe oral and oropharyngeal dysphagia with probability of silent aspiration with PO trials. Pt with significant, delayed coughing with sips of liquids. Oral musculature generally weak and cognition and awareness of POs impaired. Pt is not appropriate for diet at this time; will f/u for further trials tomorrow. Suspect pt may need short term alternate nutrition given multifactorial causes of dysphagia.     Aspiration Risk  Severe    Diet Recommendation NPO;Alternative means - temporary   Medication Administration: Via alternative means    Other  Recommendations Oral Care Recommendations: Oral care Q4 per protocol   Follow Up Recommendations  Skilled Nursing facility    Frequency and Duration min 2x/week  2 weeks   Pertinent Vitals/Pain NA    SLP Swallow Goals     Swallow Study Prior Functional Status       General HPI: Pt is a 48 year old male with a history of developmental disability, admitted with pyelonephritis, septic shock and ARF. Pt intubated on 8/14, extubated 8/19 and having BiPAP overnight. Pt has had recurrent seizures and marked spasticity and discomfort of lower extremities with spasms. MD reports dysphagia in note, ?history. NO history of seeing SLP in notes.  Type of Study: Bedside swallow evaluation Previous Swallow Assessment: none in chart Diet Prior to this Study: NPO Temperature Spikes Noted: No Respiratory Status: Nasal cannula History of Recent Intubation: Yes Length of Intubations (days): 6 days Date extubated: 12/25/13 Behavior/Cognition:  (  decreased awareness of environment) Oral Cavity - Dentition: Adequate natural dentition Self-Feeding Abilities: Total assist Patient Positioning: Upright in bed Baseline Vocal Quality: Low vocal intensity Volitional Cough: Weak Volitional Swallow: Able to elicit     Oral/Motor/Sensory Function Overall Oral Motor/Sensory Function: Impaired Labial ROM: Within Functional Limits Labial Symmetry: Within Functional Limits Labial Strength: Reduced Lingual ROM: Within Functional Limits Lingual Symmetry: Within Functional Limits Lingual Strength: Reduced Facial ROM: Within Functional Limits Facial Symmetry: Within Functional Limits Facial Strength: Reduced   Ice Chips     Thin Liquid Thin Liquid: Impaired Presentation: Cup Oral Phase Impairments: Reduced labial seal;Reduced lingual movement/coordination;Impaired anterior to posterior transit;Poor awareness of bolus Oral Phase Functional Implications: Right anterior spillage;Prolonged oral transit Pharyngeal  Phase Impairments: Suspected delayed Swallow;Decreased hyoid-laryngeal movement;Wet Vocal Quality;Cough - Delayed;Throat Clearing - Delayed    Nectar Thick Nectar Thick Liquid: Not tested   Honey Thick Honey Thick Liquid: Not tested   Puree Puree: Impaired Presentation: Spoon Oral Phase Impairments: Reduced labial seal;Impaired anterior to posterior transit;Reduced lingual movement/coordination;Poor awareness of bolus Oral Phase Functional Implications: Prolonged oral transit;Oral residue Pharyngeal Phase Impairments: Suspected delayed Swallow;Cough - Delayed;Wet Vocal Quality;Multiple swallows;Decreased hyoid-laryngeal movement   Solid   GO    Solid: Not tested      Joshua DittyBonnie Randen Kauth, MA CCC-SLP [phone]  [name], Riley NearingBonnie Cummings 12/26/2013,3:25 PM

## 2013-12-26 NOTE — Progress Notes (Signed)
Pt transferred from 2S-2 to 2C-3 via bed, on monitor and 3L/[place] O2. Upon arrival, right IJ trialysis cath, right hand 22 G PIV, foley cath, and flexiseal intact. VSS, no apparent distress at time of transfer. [facility].

## 2013-12-26 NOTE — Evaluation (Addendum)
Occupational Therapy Evaluation Patient Details Name: [name] MRN: [id number] DOB: 06/15/1965 Today's Date: 12/26/2013    History of Present Illness This 48 y.o. male admitted 8/14 from [facility] with fever and AMS.   Pt was unable to protect his airway and was intubated.   Dx: pyelonephritis; r/o aspiration PNA; encephalopathy.  Pt extubated 12/25/13.  PMH:  h/o developmental disability; COPD; sleep apnea.   Multiple ED visits/admits since 7/15 for falls.    Clinical Impression   Pt admitted with above. He demonstrates the below listed deficits and will benefit from continued OT to maximize safety and independence with BADLs.   Pt is very lethargic this am as he had received Fentanyl for LE's pain and therefore with difficulty fully participating in eval.   Pt with severe LE spasms and indication of pain.   He maintains bil. UEs in flexed position and initial attempts at ROM yield rigidity/?spasm.  He was however, able to assist with some movements with assist and max cues.   UE movement appears weak, but unable to accurately assess strength due to lethargy.   He requires total A +2 with moving to EOB due to severity of LEs spasms.   Unable to attempt standing.   I reviewed his chart from 11/28/13 on.   Per chart, pt was ambulatory using a RW and was modified independent with all BADLs.  He has had a SIGNIFICANT decline in function in less than a month,  and appears now to possibly have UE weakness and spasms as well.  He needs extensive therapy and spasticity management to allow him to participate.         Follow Up Recommendations  SNF    Equipment Recommendations  None recommended by OT    Recommendations for Other Services       Precautions / Restrictions Precautions Precautions: Fall Precaution Comments: h/o mult falls at  ALF Restrictions Weight Bearing Restrictions: No      Mobility Bed Mobility Overal bed mobility: +2 for physical assistance;Needs  Assistance Bed Mobility: Supine to Sit;Sit to Supine     Supine to sit: Total assist;+2 for physical assistance Sit to supine: Total assist;+2 for physical assistance   General bed mobility comments: Pt able to assist minimally.  Pt with severe spasms bil. LEs with pt grimmacing and indicating pain.    Transfers                 General transfer comment: unable to attempt due to severity of LE spasms and pain present    Balance Overall balance assessment: Needs assistance Sitting-balance support: Single extremity supported;Feet supported Sitting balance-Leahy Scale: Poor Sitting balance - Comments: requires mod A - max A to sit EOB.  Pt maintains flexed posture                                    ADL Overall ADL's : Needs assistance/impaired Eating/Feeding: Total assistance;Bed level   Grooming: Wash/dry face;Maximal assistance Grooming Details (indicate cue type and reason): Rt UE, hand over hand assist Upper Body Bathing: Total assistance;Sitting;Bed level   Lower Body Bathing: Total assistance;Bed level   Upper Body Dressing : Total assistance;Sitting;Bed level   Lower Body Dressing: Total assistance;Bed level   Toilet Transfer: Total assistance (unable )   Toileting- Clothing Manipulation and Hygiene: Total assistance;Bed level       Functional mobility during ADLs: Total assistance (Unable) General  ADL Comments: Pt lethargic (RN reports he had received Fentanyl for LE pain/spasms).  Pt with severe spasms bil. LEs.  Bil. UEs initially in flexed position with rigidity/?spasms present.  He is able to assist with UE movement with max cueing and assist.  He was able to wash face minimally with hand over hand assist.       Vision                 Additional Comments: Unable to asses due to lethargy    Perception     Praxis      Pertinent Vitals/Pain Pain Assessment: Faces Pain Score: 8  Faces Pain Scale: Hurts whole lot Pain Location:  bil. LEs = spasms noted with any attempts to tocuh/move LEs Pain Intervention(s): Limited activity within patient's tolerance;Monitored during session;Repositioned (RN notified)     Hand Dominance Right   Extremity/Trunk Assessment Upper Extremity Assessment Upper Extremity Assessment: RUE deficits/detail;LUE deficits/detail RUE Deficits / Details: Pt maintains elbows flexed with rigidity/?spasms noted.  With assist and verbal cues, he is able to assist with movements of UEs to bring hand to mouth and to make a fist.  Could/did not attempt to lift UE overhead on command.  Movement appears very weak, but difficult to definitively assess due to lethargy  RUE Coordination: decreased fine motor;decreased gross motor LUE Deficits / Details: Pt maintains UEs with elbow flexion.  WIth assist and max verbal cues, he is able to assist with moving hand to mouth, making a fist, and raising UE overhead.   Movement appears very weak.  Initially Lt. UE appeared rigid/?spasticity.   Difficult to asses level of weakness/strength present due to lethargy  LUE Coordination: decreased fine motor;decreased gross motor   Lower Extremity Assessment Lower Extremity Assessment: Defer to PT evaluation   Cervical / Trunk Assessment Cervical / Trunk Assessment: Other exceptions Cervical / Trunk Exceptions: pt maintains trunk and pelvis in flexed position while sitting EOB   Communication Communication Communication: No difficulties   Cognition Arousal/Alertness: Lethargic Behavior During Therapy: Flat affect Overall Cognitive Status: Difficult to assess                     General Comments       Exercises       Shoulder Instructions      Home Living Family/patient expects to be discharged to:: Skilled nursing facility                                 Additional Comments: Pt lives at [facility].  Per chart review, pt was ambulatory with RW until 7/15.  He fell out of bed and then  had subsequent falls with ED visits and has been using a w/c since that time due to safety issues.       Prior Functioning/Environment Level of Independence: Needs assistance  Gait / Transfers Assistance Needed: per chart review, pt has been using w/c and was +2 assist for transfers last admission.  ADL's / Homemaking Assistance Needed: uncertain as pt unable to provide information.  Pt requires +2 assist for transfers so anticipate he has required max - total A for LB ADLs and toilet transfers.  Per therapy notes in 7/15, pt was modified independent with BADLs prior to that time.         OT Diagnosis: Generalized weakness;Cognitive deficits;Acute pain;Paresis   OT Problem List: Decreased strength;Decreased activity tolerance;Impaired balance (sitting  and/or standing);Decreased coordination;Decreased cognition;Decreased safety awareness;Decreased knowledge of use of DME or AE;Impaired tone;Impaired UE functional use;Pain;Decreased range of motion   OT Treatment/Interventions: Self-care/ADL training;Therapeutic exercise;Neuromuscular education;DME and/or AE instruction;Manual therapy;Therapeutic activities;Patient/family education;Balance training    OT Goals(Current goals can be found in the care plan section) Acute Rehab OT Goals OT Goal Formulation: Patient unable to participate in goal setting Time For Goal Achievement: 01/09/14 Potential to Achieve Goals: Fair ADL Goals Pt Will Perform Eating: with mod assist;with adaptive utensils;sitting;bed level Pt Will Perform Grooming: with mod assist;with adaptive equipment;sitting;bed level Additional ADL Goal #1: Pt will perform rolling with mod A in prep for BADLs Additional ADL Goal #2: Pt will sit EOB with min A in prep for BADLs and functional transfers.   OT Frequency: Min 2X/week   Barriers to D/C: Decreased caregiver support          Co-evaluation              End of Session Nurse Communication: Mobility status (level of  pain indicated during session )  Activity Tolerance: Patient limited by pain;Patient limited by lethargy Patient left: in bed;with call bell/phone within reach   Time: 1135-1155 OT Time Calculation (min): 20 min Charges:  OT General Charges $OT Visit: 1 Procedure OT Evaluation $Initial OT Evaluation Tier I: 1 Procedure G-Codes:    Jeani Hawking M 01-19-14, 12:53 PM

## 2013-12-26 NOTE — Progress Notes (Signed)
Subjective: No recurrent seizures reported. No adverse clinical events reported otherwise.  Objective: Current vital signs: BP 101/55  Pulse 96  Temp(Src) 97.7 F (36.5 C) (Axillary)  Resp 14  Ht 5' 1.81" (1.57 m)  Wt 87.8 kg (193 lb 9 oz)  BMI 35.62 kg/m2  SpO2 100%  Neurologic Exam: Alert and appeared to be in discomfort with spasms involving lower extremities. Speech was unintelligible. Patient was able to follow simple commands. Normal symmetrical strength of upper extremities proximally and distally. Strength of lower extremities was difficult to assess because of marked spasticity which was exacerbated movement, including passive movement. Patient had marked hyperreflexia in the lower extremities as well as bilateral upgoing toes.  Valproic acid level on 12/25/2013 was normal (55.7 (.  Medications: I have reviewed the patient's current medications.  Assessment/Plan: 48 year old man encephalopathic secondary to multiple factors including acute on chronic renal failure's sepsis. Seizures are controlled at this point. Patient has marked spasticity and discomfort of lower extremities with spasms.  Recommendations: 1. No change in current dose of valproic acid. 2. Trial of baclofen starting at 5 mg every 8 hours for spasticity, when patient is able to receive oral medication. 3. Physical therapy consultation.  C.R. Roseanne RenoStewart, MD [facility] [phone]  12/26/2013  10:13 AM

## 2013-12-27 LAB — RENAL FUNCTION PANEL
ALBUMIN: 1.6 g/dL — AB (ref 3.5–5.2)
ANION GAP: 16 — AB (ref 5–15)
BUN: 70 mg/dL — ABNORMAL HIGH (ref 6–23)
CO2: 24 mEq/L (ref 19–32)
Calcium: 8.5 mg/dL (ref 8.4–10.5)
Chloride: 106 mEq/L (ref 96–112)
Creatinine, Ser: 6.88 mg/dL — ABNORMAL HIGH (ref 0.50–1.35)
GFR calc Af Amer: 10 mL/min — ABNORMAL LOW (ref >90)
GFR calc non Af Amer: 9 mL/min — ABNORMAL LOW (ref >90)
Glucose, Bld: 72 mg/dL (ref 70–99)
PHOSPHORUS: 6.7 mg/dL — AB (ref 2.3–4.6)
POTASSIUM: 4 meq/L (ref 3.7–5.3)
SODIUM: 146 meq/L (ref 137–147)

## 2013-12-27 LAB — CBC
HEMATOCRIT: 27.2 % — AB (ref 39.0–52.0)
Hemoglobin: 8.6 g/dL — ABNORMAL LOW (ref 13.0–17.0)
MCH: 32.8 pg (ref 26.0–34.0)
MCHC: 31.6 g/dL (ref 30.0–36.0)
MCV: 103.8 fL — ABNORMAL HIGH (ref 78.0–100.0)
PLATELETS: 114 10*3/uL — AB (ref 150–400)
RBC: 2.62 MIL/uL — ABNORMAL LOW (ref 4.22–5.81)
RDW: 14.6 % (ref 11.5–15.5)
WBC: 5.1 10*3/uL (ref 4.0–10.5)

## 2013-12-27 MED ORDER — LORAZEPAM 2 MG/ML IJ SOLN: 0.5000 mg | Freq: Four times a day (QID) | INTRAMUSCULAR | Status: DC | PRN

## 2013-12-27 MED ORDER — SODIUM CHLORIDE 0.9 % IV SOLN: INTRAVENOUS | Status: DC

## 2013-12-27 MED ORDER — SODIUM CHLORIDE 0.45 % IV SOLN
INTRAVENOUS | Status: DC
  Administered 2013-12-27: 500 mL via INTRAVENOUS
  Administered 2013-12-27 – 2013-12-28 (×2): via INTRAVENOUS

## 2013-12-27 MED ORDER — LORAZEPAM 2 MG/ML IJ SOLN: 1.0000 mg | INTRAMUSCULAR | Status: DC | PRN

## 2013-12-27 NOTE — Progress Notes (Signed)
SLP Cancellation Note  Patient Details Name: [name] MRN: [id number] DOB: 07/10/1965   Cancelled treatment:        Attempted to see x 2 for solid/liquid trials.  Pt. Lethargic, decreased awareness from ST observation and OT report.  Will continue to follow for po trials   Royce MacadamiaLitaker, Orphia Mctigue Willis 12/27/2013, 3:01 PM [phone]

## 2013-12-27 NOTE — Progress Notes (Signed)
Subjective: No recurrent seizures reported. No adverse clinical events overnight reported otherwise.  Objective: Current vital signs: BP 108/50  Pulse 94  Temp(Src) 98.3 F (36.8 C) (Axillary)  Resp 15  Ht 5\' 2"  (1.575 m)  Wt 84.6 kg (186 lb 8.2 oz)  BMI 34.10 kg/m2  SpO2 100%  Neurologic Exam: Patient was moderately difficult to arouse. He was eye-opening but no purposeful interaction. Pupils were equal and reacted normally to light. Extraocular movements are absent with oculocephalic maneuvers. Patient also has a visual tracking. Face was symmetrical with no signs of weakness. Muscle tone was increased throughout, markedly so and lower extremities spasms induced with movement of his limbs.  Medications: I have reviewed the patient's current medications.  Assessment/Plan: Encephalopathic state secondary to multiple factors with renal failure as well as sepsis. No recurrence of seizure activity reported. Spasticity and weakness of lower extremities was present prior to onset of encephalopathic state started several months prior to hospitalization. NPO. was recommended by speech therapy.   Recommend no changes in current management. We will continue to follow this patient with you.  C.R. Roseanne RenoStewart, MD [facility] [phone]  12/27/2013  8:30 AM

## 2013-12-27 NOTE — Progress Notes (Signed)
Occupational Therapy Treatment Patient Details Name: [name] MRN: [id number] DOB: 1966/01/30 Today's Date: 12/27/2013    History of present illness This 48 y.o. male admitted 8/14 from [facility] with fever and AMS.   Pt was unable to protect his airway and was intubated.   Dx: pyelonephritis; r/o aspiration PNA; encephalopathy.  Pt extubated 12/25/13.  PMH:  h/o developmental disability; COPD; sleep apnea.   Multiple ED visits/admits since 7/15 for falls.    OT comments  Pt very lethargic this am, unable to maintain arousal to participate.  Tightness in elbows with difficulty achieving full elbow extension.    Follow Up Recommendations  SNF    Equipment Recommendations  None recommended by OT    Recommendations for Other Services      Precautions / Restrictions Precautions Precautions: Fall Precaution Comments: h/o mult falls at  ALF Restrictions Weight Bearing Restrictions: No       Mobility Bed Mobility                  Transfers                      Balance                                   ADL                                         General ADL Comments: Pt unable to participate this am.  follows no commands.  Awakens briefly with max stimuli, but fades back out.  PROM performed.  Tightness noted with elbow extension bil.       Vision                     Perception     Praxis      Cognition   Behavior During Therapy: Flat affect Overall Cognitive Status: Impaired/Different from baseline Area of Impairment: Attention                General Comments: Pt very lehargic and not following commands today.  Unable to maintain arousal     Extremity/Trunk Assessment               Exercises Other Exercises Other Exercises: PROM bil. UEs performed.  Tightness noted with elbow extension.  Difficulty achieving end range elbow extension    Shoulder Instructions       General  Comments      Pertinent Vitals/ Pain       Pain Assessment: Faces Faces Pain Scale: Hurts even more Pain Intervention(s): Monitored during session;Repositioned  Home Living                                          Prior Functioning/Environment              Frequency Min 2X/week     Progress Toward Goals  OT Goals(current goals can now be found in the care plan section)  Progress towards OT goals: Not progressing toward goals - comment (lethargy)  ADL Goals Pt Will Perform Eating: with mod assist;with adaptive utensils;sitting;bed level Pt Will Perform Grooming: with mod assist;with adaptive equipment;sitting;bed level Pt Will  Perform Upper Body Bathing: with set-up;sitting Pt Will Perform Upper Body Dressing: with supervision Pt Will Perform Lower Body Dressing: with min assist Pt Will Transfer to Toilet: with mod assist;with +2 assist;bedside commode Pt/caregiver will Perform Home Exercise Program: Both right and left upper extremity;Increased strength Additional ADL Goal #1: Pt will perform rolling with mod A in prep for BADLs Additional ADL Goal #2: Pt will sit EOB with min A in prep for BADLs and functional transfers.   Plan Discharge plan remains appropriate    Co-evaluation                 End of Session     Activity Tolerance Patient limited by lethargy   Patient Left in bed;with call bell/phone within reach   Nurse Communication          Time: 1049-1106 OT Time Calculation (min): 17 min  Charges: OT General Charges $OT Visit: 1 Procedure OT Treatments $Therapeutic Activity: 8-22 mins  Billye Pickerel M 12/27/2013, 11:46 AM

## 2013-12-27 NOTE — Progress Notes (Signed)
[place] TEAM 1 - Stepdown/ICU TEAM Progress Note  [name] UVO:[id number] DOB: May 23, 1965 DOA: 12/14/2013 PCP: Default, Provider, MD  Admit HPI / Brief Narrative: 48 y/o M SNF resident with PMH of developmental delay, DJD, O2 dependent COPD / Tobacco Abuse, OSA, Cervical / Lumbosacral spondylosis & recent admission to [facility] for back pain & falls who presented to [facility] on 12/27/2013 with fever and AMS. Reportedly he had a worsening mental status for 3-4 days prior to admission. Work up at [place] was remarkable for UTI, lactic acid 1.1, Na 140, K 5.1, AG 22, sr cr 8.95, Alk phos 106, CK 560, INR 1.2, WBC 9.6, Hgb 11.7. Given acute kidney injury, patient was transferred to [facility] for further care.   Upon arrival to [facility], the patient was assessed by [facility] and found to have concerns for posturing, inability to protect his airway, and shock and PCCM was emergently consulted for evaluation. Patient was intubated and HD catheter placed by PCCM.     Significant Events: 8/14 Admit to [facility] from [place], SNF resident with fever, AMS, hypotension. Intubated for airway protection. Vasopressors initiated  8/14 Neuro consult: Valproic acid initiated  8/14 Renal consult: CRRT initiated  8/15 Vasopressors weaned to off  8/16 ?recurrent seizures >> VPA level low  8/18 Off CRRT  8/19 Extubated  8/20 Transfer to SDU  HPI/Subjective: Pt is alert but unable to communicate.  He grumbles unintelligibly when questioned.  No family present.    Assessment/Plan:  Acute respiratory failure 2nd to septic shock Resolved - extubated 8/19  Septic shock 2nd to pyelonephritis multiple organism in urine cx 8/14 - completed 7 day course of Zosyn   AKI, anuric 2nd to pyelonephritis, septic shock, myelopathy and neurogenic bladder Nephrology following - no hydronephrosis from renal ultra sound - MRI noted distended bladder suggesting neurogenic bladder - foley cath - see Nephrology notes    Thrombocytopenia HIT panel negative - plt count climbing - likely due to pyelo (gram negative infxn)  Spacticity/spasms of lower extremities Neurology following - records suggest pt was ambulating w/ a rolling walker as late as 11/28/2013 - pt has myelogram 7/30 apparently for this issue, with study noting most severe issue being "moderate" 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 rate 17, height 5' 2"  (1.575 m), weight 84.6 kg (186 lb 8.2 oz), SpO2 100.00%.  Intake/Output Summary (Last 24 hours) at 12/27/13 1716 Last data filed at 12/27/13 1600  Gross per 24 hour  Intake 331.17 ml  Output    500 ml  Net -168.83 ml   Exam: General: No acute respiratory distress Lungs: Clear to auscultation bilaterally without wheezes or crackles Cardiovascular: Regular rate and rhythm without murmur gallop or rub Abdomen: Nontender, nondistended, soft, bowel sounds positive, no rebound, no ascites, no appreciable mass Extremities: No significant cyanosis, clubbing, or edema bilateral lower extremities - no evidence of pain w/ examination of legs Neuro:  Follows examiner w/ eyes- speech mumbling/garbled - no focal CN deficits noted   Data Reviewed: Basic Metabolic Panel:  Recent Labs Lab 12/21/13 0400  12/22/13 0404 12/22/13 1645 12/23/13 0400 12/23/13 1613 12/24/13 0400 12/25/13 0400 12/26/13 0420 12/27/13 0521    NA 140  < > 134* 137 139 137 138 142 139 146  K 5.0  < > 4.2 4.3 4.3 4.3 4.2 4.1 3.8 4.0  CL 104  < > 101 102 104 102 103 104 103 106  CO2 21  < > 24 27 26 27 26 27 24 24   GLUCOSE 106*  < > 116* 88 83 81 84 106* 70 72  BUN 40*  < > 17 14 13 12 12  31* 53* 70*  CREATININE 3.86*  < > 1.94* 1.59* 1.66* 1.47* 1.59* 3.04* 5.00* 6.88*  CALCIUM 7.8*  < > 7.9* 8.1* 8.0* 8.0* 8.0* 8.3* 8.4 8.5  MG 2.0  --  2.3  --  2.3  --  2.3  --   --   --   PHOS 3.6  < > 2.9 2.0* 1.8* 1.9*  --  3.7  --  6.7*  < > = values in this interval not displayed.  Liver Function Tests:  Recent Labs Lab 12/21/13 0400  12/22/13 0404 12/22/13 1645 12/23/13 0400 12/23/13 1613 12/25/13 0400 12/27/13 0521  AST 30  --  87*  --  92*  --   --   --   ALT 34  --  47  --  57*  --   --   --   ALKPHOS 164*  --  130*  --  118*  --   --   --   BILITOT 0.4  --  0.4  --  0.3  --   --   --   PROT 5.7*  --  5.1*  --  5.0*  --   --   --   ALBUMIN 1.8*  < > 1.5* 1.7* 1.6* 1.6* 1.5* 1.6*  < > = values in this interval not displayed.  Coags:  Recent Labs Lab 12/22/13 0404  INR 1.24   CBC:  Recent Labs Lab 12/23/13 0400 12/24/13 0400 12/25/13 0500 12/26/13 0420 12/27/13 0500  WBC 4.4 4.5 5.1 5.8 5.1  HGB 8.3* 8.5* 8.4* 8.2* 8.6*  HCT 25.4* 26.2* 26.2* 25.3* 27.2*  MCV 99.6 101.6* 102.3* 102.8* 103.8*  PLT 55* 48* 55* 70* 114*   CBG:  Recent Labs Lab 12/24/13 2345 12/25/13 0402 12/25/13 0752 12/25/13 1254 12/26/13 0746  GLUCAP 130* 116* 111* 93 84    Recent Results (from the past 240 hour(s))  MRSA PCR SCREENING     Status: None   Collection Time    12/09/2013  5:57 AM      Result Value Ref Range Status   MRSA by PCR NEGATIVE  NEGATIVE Final   Comment:            The GeneXpert MRSA Assay (FDA     approved for NASAL specimens     only), is one component of a     comprehensive MRSA colonization     surveillance program. It is not     intended to diagnose MRSA     infection nor to guide or      monitor treatment for     MRSA infections.  CULTURE, BLOOD (ROUTINE X 2)     Status: None   Collection Time    12/12/2013  8:20 AM  Result Value Ref Range Status   Specimen Description BLOOD CENTRAL LINE   Final   Special Requests BOTTLES DRAWN AEROBIC ONLY [facility]   Final   Culture  Setup Time     Final   Value: 12/31/2013 13:45     Performed at [facility]   Culture     Final   Value: NO GROWTH 5 DAYS     Performed at [facility]   Report Status 12/26/2013 FINAL   Final  CLOSTRIDIUM DIFFICILE BY PCR     Status: None   Collection Time    12/22/2013  8:40 AM      Result Value Ref Range Status   C difficile by pcr NEGATIVE  NEGATIVE Final  URINE CULTURE     Status: None   Collection Time    12/28/2013  8:41 AM      Result Value Ref Range Status   Specimen Description URINE, RANDOM   Final   Special Requests NONE   Final   Culture  Setup Time     Final   Value: 12/21/2013 13:49     Performed at [place]     Final   Value: >=100,000 COLONIES/ML     Performed at [facility]   Culture     Final   Value: Multiple bacterial morphotypes present, none predominant. Suggest appropriate recollection if clinically indicated.     Performed at [facility]   Report Status 12/22/2013 FINAL   Final  CULTURE, BLOOD (ROUTINE X 2)     Status: None   Collection Time    12/08/2013  9:45 AM      Result Value Ref Range Status   Specimen Description BLOOD RIGHT HAND   Final   Special Requests BOTTLES DRAWN AEROBIC ONLY 4CC   Final   Culture  Setup Time     Final   Value: 01/03/2014 13:47     Performed at [facility]   Culture     Final   Value: NO GROWTH 5 DAYS     Performed at [facility]   Report Status 12/26/2013 FINAL   Final  CULTURE, RESPIRATORY (NON-EXPECTORATED)     Status: None   Collection Time    12/28/2013 10:37 AM      Result Value Ref Range Status   Specimen Description ENDOTRACHEAL   Final   Special  Requests Normal   Final   Gram Stain     Final   Value: RARE WBC PRESENT, PREDOMINANTLY MONONUCLEAR     RARE SQUAMOUS EPITHELIAL CELLS PRESENT     RARE GRAM POSITIVE COCCI     IN PAIRS IN CLUSTERS     Performed at [facility]   Culture     Final   Value: Non-Pathogenic Oropharyngeal-type Flora Isolated.     Performed at [facility]   Report Status 12/22/2013 FINAL   Final     Studies:  Recent x-ray studies have been reviewed in detail by the Attending Physician  Scheduled Meds:  Scheduled Meds: . antiseptic oral rinse  7 mL Mouth Rinse QID  . chlorhexidine  15 mL Mouth Rinse BID  . heparin subcutaneous  5,000 Units Subcutaneous 3 times per day  . pantoprazole (PROTONIX) IV  40 mg Intravenous Q24H  . sodium chloride  10-40 mL Intracatheter Q12H  . sodium chloride  3 mL Intravenous Q12H  . valproate sodium  750 mg Intravenous 3 times  per day    Time spent on care of this patient: 35 mins   [facility] T , MD   [facility] Office  [phone] Pager - Text Page per Shea Evans as per below:  On-Call/Text Page:      [email or web address]      password [email or web address]  If 7PM-7AM, please contact night-coverage [email or web address] Password [email or web address] 12/27/2013, 5:16 PM   LOS: 7 days

## 2013-12-27 NOTE — Progress Notes (Signed)
S: Patient able to say his name but unable to converse.  Poor eye contact. O:BP 108/50  Pulse 94  Temp(Src) 98.3 F (36.8 C) (Axillary)  Resp 15  Ht 5\' 2"  (1.575 m)  Wt 186 lb 8.2 oz (84.6 kg)  BMI 34.10 kg/m2  SpO2 100%  Intake/Output Summary (Last 24 hours) at 12/27/13 0847 Last data filed at 12/27/13 0400  Gross per 24 hour  Intake 248.67 ml  Output    390 ml  Net -141.33 ml   Intake/Output: I/O last 3 completed shifts: In: 593.7 [I.V.:213.7; IV Piggyback:380] Out: 845 [Urine:545; Stool:300]  Intake/Output this shift:    Weight change: -8 lb 6 oz (-3.8 kg) ZOX:WRUEAVWGen:resting in bed, disheveled. CVS:RRR Resp:CTAB UJW:[id number]:normoactive bowel sounds, tender in RLQ Ext:no edema   Recent Labs Lab 12/21/13 0400 12/21/13 1600 12/22/13 0404 12/22/13 1645 12/23/13 0400 12/23/13 1613 12/24/13 0400 12/25/13 0400 12/26/13 0420 12/27/13 0521  NA 140 138 134* 137 139 137 138 142 139 146  K 5.0 4.8 4.2 4.3 4.3 4.3 4.2 4.1 3.8 4.0  CL 104 103 101 102 104 102 103 104 103 106   CO2 21 25 24 27 26 27 26 27 24  24  GLUCOSE 106* 102* 116* 88 83 81 84 106* 70 72  BUN 40* 26* 17 14 13 12 12  31* 53* 70*  CREATININE 3.86* 2.89* 1.94* 1.59* 1.66* 1.47* 1.59* 3.04* 5.00* 6.88*  ALBUMIN 1.8* 1.8* 1.5* 1.7* 1.6* 1.6*  --  1.5*  --  1.6*  CALCIUM 7.8* 7.9* 7.9* 8.1* 8.0* 8.0* 8.0* 8.3* 8.4 8.5  PHOS 3.6 3.7 2.9 2.0* 1.8* 1.9*  --  3.7  --  6.7*  AST 30  --  87*  --  92*  --   --   --   --   --   ALT 34  --  47  --  57*  --   --   --   --   --    Liver Function Tests:  Recent Labs Lab 12/21/13 0400  12/22/13 0404  12/23/13 0400 12/23/13 1613 12/25/13 0400 12/27/13 0521  AST 30  --  87*  --  92*  --   --   --   ALT 34  --  47  --  57*  --   --   --   ALKPHOS 164*  --  130*  --  118*  --   --   --   BILITOT 0.4  --  0.4  --  0.3  --   --   --   PROT 5.7*  --  5.1*  --  5.0*  --   --   --   ALBUMIN 1.8*  < > 1.5*  < > 1.6* 1.6* 1.5* 1.6*  < > = values in this interval not displayed. No  results found for this basename: LIPASE, AMYLASE,  in the last 168 hours No results found for this basename: AMMONIA,  in the last 168 hours CBC:  Recent Labs Lab 12/23/13 0400 12/24/13 0400 12/25/13 0500 12/26/13 0420 12/27/13 0500  WBC 4.4 4.5 5.1 5.8 5.1  HGB 8.3* 8.5* 8.4* 8.2* 8.6*  HCT 25.4* 26.2* 26.2* 25.3* 27.2*  MCV 99.6 101.6* 102.3* 102.8* 103.8*  PLT 55* 48* 55* 70* 114*   Cardiac Enzymes: No results found for this basename: CKTOTAL, CKMB, CKMBINDEX, TROPONINI,  in the last 168 hours CBG:  Recent Labs Lab 12/24/13 2345 12/25/13 0402 12/25/13 0752 12/25/13 1254 12/26/13 0746  GLUCAP 130*  116* 111* 93 84    Iron Studies: No results found for this basename: IRON, TIBC, TRANSFERRIN, FERRITIN,  in the last 72 hours Studies/Results: No results found. Marland Kitchen antiseptic oral rinse  7 mL Mouth Rinse QID  . chlorhexidine  15 mL Mouth Rinse BID  . heparin subcutaneous  5,000 Units Subcutaneous 3 times per day  . pantoprazole (PROTONIX) IV  40 mg Intravenous Q24H  . sodium chloride  10-40 mL Intracatheter Q12H  . sodium chloride  3 mL Intravenous Q12H  . valproate sodium  750 mg Intravenous 3 times per day    BMET    Component Value Date/Time   NA 146 12/27/2013 0521   K 4.0 12/27/2013 0521   CL 106 12/27/2013 0521   CO2 24 12/27/2013 0521   GLUCOSE 72 12/27/2013 0521   BUN 70* 12/27/2013 0521   CREATININE 6.88* 12/27/2013 0521   CALCIUM 8.5 12/27/2013 0521   GFRNONAA 9* 12/27/2013 0521   GFRAA 10* 12/27/2013 0521   CBC    Component Value Date/Time   WBC 5.1 12/27/2013 0500   RBC 2.62* 12/27/2013 0500   RBC 4.28 09/21/2012 0436   HGB 8.6* 12/27/2013 0500   HCT 27.2* 12/27/2013 0500   PLT 114* 12/27/2013 0500   MCV 103.8* 12/27/2013 0500   MCH 32.8 12/27/2013 0500   MCHC 31.6 12/27/2013 0500   RDW 14.6 12/27/2013 0500   LYMPHSABS 0.7 12/10/2013 0815   MONOABS 0.4 12/22/2013 0815   EOSABS 0.0 01/03/2014 0815   BASOSABS 0.0 12/24/2013 0815   Blood Cx 12/11/2013: 2/2  NGTD Urine Cx 12/30/2013: Multiple morphotypes present Respiratory Cx 12/21/2013: Non pathogenic oropharyngeal type flora   Assessment/Plan:  1. Acute Renal Failure/ Oliguric-  UOP roughly unchanged at 325 cc in last 24 hours, SCr continues to rise up to 6.88 today, with BUN rising to 70.  He is net negative output and has had poor PO intake.  His lack of UOP may be due to poor lack of intake.  Will hold off HD for now and start 1/2NS (due to rising serum Na and no po intake) at 50cc/hr to try to avoid need of HD. 2. Sepsis- due to pyelonephritis, Finished course of Zosyn. 3. Thrombocytopenia- Platelets continue to trend up, HIT panel negative. 4. Acute Encephalopathy/ Seizures: Neurology following, trial of baclofen.   [facility] Internal Medicine PGY2 Pager [phone]  I have seen and examined this patient and agree with plan as outlined by Dr. Mikey Bussing.  Mr. Raborn appears to be prerenal and now with hypernatremia and will start 1/2NS and follow UOP, Scr and Na levels.  Pt is a poor candidate for long-term dialysis and if he does not significantly improve, would recommend palliative care to help set goals/limits of care (as he is a full code as of now). Phebe Dettmer A,MD 12/27/2013 12:28 PM

## 2013-12-28 ENCOUNTER — Inpatient Hospital Stay (HOSPITAL_COMMUNITY): Payer: Medicaid Other

## 2013-12-28 LAB — HEPATITIS B SURFACE ANTIBODY,QUALITATIVE: Hep B S Ab: NEGATIVE

## 2013-12-28 LAB — CBC
HCT: 26 % — ABNORMAL LOW (ref 39.0–52.0)
Hemoglobin: 8.7 g/dL — ABNORMAL LOW (ref 13.0–17.0)
MCH: 33.2 pg (ref 26.0–34.0)
MCHC: 33.5 g/dL (ref 30.0–36.0)
MCV: 99.2 fL (ref 78.0–100.0)
Platelets: 148 10*3/uL — ABNORMAL LOW (ref 150–400)
RBC: 2.62 MIL/uL — ABNORMAL LOW (ref 4.22–5.81)
RDW: 14.4 % (ref 11.5–15.5)
WBC: 5.6 10*3/uL (ref 4.0–10.5)

## 2013-12-28 LAB — COMPREHENSIVE METABOLIC PANEL
ALT: 35 U/L (ref 0–53)
AST: 24 U/L (ref 0–37)
Albumin: 1.6 g/dL — ABNORMAL LOW (ref 3.5–5.2)
Alkaline Phosphatase: 71 U/L (ref 39–117)
Anion gap: 20 — ABNORMAL HIGH (ref 5–15)
BUN: 85 mg/dL — ABNORMAL HIGH (ref 6–23)
CALCIUM: 8.2 mg/dL — AB (ref 8.4–10.5)
CHLORIDE: 105 meq/L (ref 96–112)
CO2: 21 meq/L (ref 19–32)
Creatinine, Ser: 8.42 mg/dL — ABNORMAL HIGH (ref 0.50–1.35)
GFR, EST AFRICAN AMERICAN: 8 mL/min — AB (ref >90)
GFR, EST NON AFRICAN AMERICAN: 7 mL/min — AB (ref >90)
GLUCOSE: 72 mg/dL (ref 70–99)
Potassium: 4 mEq/L (ref 3.7–5.3)
Sodium: 146 mEq/L (ref 137–147)
Total Protein: 5 g/dL — ABNORMAL LOW (ref 6.0–8.3)

## 2013-12-28 LAB — GLUCOSE, CAPILLARY
Glucose-Capillary: 69 mg/dL — ABNORMAL LOW (ref 70–99)
Glucose-Capillary: 140 mg/dL — ABNORMAL HIGH (ref 70–99)

## 2013-12-28 LAB — HEPATITIS B CORE ANTIBODY, TOTAL: Hep B Core Total Ab: NONREACTIVE

## 2013-12-28 LAB — HEPATITIS B SURFACE ANTIGEN: Hepatitis B Surface Ag: NEGATIVE

## 2013-12-28 MED ORDER — DEXTROSE 50 % IV SOLN
INTRAVENOUS | Status: AC
  Filled 2013-12-28: qty 50

## 2013-12-28 MED ORDER — DEXTROSE 50 % IV SOLN
1.0000 | Freq: Once | INTRAVENOUS | Status: AC
  Administered 2013-12-28: 50 mL via INTRAVENOUS
  Filled 2013-12-28: qty 50

## 2013-12-28 MED ORDER — DEXTROSE-NACL 5-0.45 % IV SOLN
INTRAVENOUS | Status: DC
  Administered 2013-12-28: 17:00:00 via INTRAVENOUS
  Administered 2013-12-29: 50 mL/h via INTRAVENOUS
  Administered 2013-12-30: 1000 mL via INTRAVENOUS

## 2013-12-28 NOTE — Progress Notes (Signed)
S:Seen and examined this AM.  He is able to follow simple commands (blinks when asked to blink).  Some single word responses, but still not able to communicate too well. O:BP 122/56  Pulse 102  Temp(Src) 98.6 F (37 C) (Axillary)  Resp 15  Ht  (1.575 m)  Wt 83.4 kg (183 lb 13.8 oz)  BMI 33.62 kg/m2  SpO2 97%  Intake/Output Summary (Last 24 hours) at 12/28/13 0904 Last data filed at 12/28/13 0800  Gross per 24 hour  Intake   1660 ml  Output    675 ml  Net    985 ml   Intake/Output: I/O last 3 completed shifts: In: 1696.2 [I.V.:1408.7; IV Piggyback:287.5] Out: 1175 [Urine:1175]  Intake/Output this shift:  Total I/O In: 75 [I.V.:75] Out: -  Weight change: -0.6 kg (-1 lb 5.2 oz) Gen:in bed in NAD CVS:RRR, no r/m/g Resp:CTA B/L but poor effort ZOX:WRUE, NT, ND GU:  200cc clear yellow urine in bag Ext:no edema, non-tender   Recent Labs Lab 12/21/13 1600 12/22/13 0404 12/22/13 1645 12/23/13 0400 12/23/13 1613 12/24/13 0400 12/25/13 0400 12/26/13 0420 12/27/13 0521 12/28/13 0530  NA 138 134* 137 139 137 138 142 139 146 146  K 4.8 4.2 4.3 4.3 4.3 4.2 4.1 3.8 4.0 4.0  CL  CO2 25 24 27 26 27 26 27 24 24  21  GLUCOSE 102* 116* 88 83 81 84 106* 70 72 72  BUN 26* 31* 53* 70* 85*  CREATININE 2.89* 1.94* 1.59* 1.66* 1.47* 1.59* 3.04* 5.00* 6.88* 8.42*  ALBUMIN 1.8* 1.5* 1.7* 1.6* 1.6*  --  1.5*  --  1.6* 1.6*  CALCIUM 7.9* 7.9* 8.1* 8.0* 8.0* 8.0* 8.3* 8.4 8.5 8.2*  PHOS 3.7 2.9 2.0* 1.8* 1.9*  --  3.7  --  6.7*  --   AST  --  87*  --  92*  --   --   --   --   --  24  ALT  --  47  --  57*  --   --   --   --   --  35   Liver Function Tests:  Recent Labs Lab 12/22/13 0404  12/23/13 0400  12/25/13 0400 12/27/13 0521 12/28/13 0530  AST 87*  --  92*  --   --   --  24  ALT 47  --  57*  --   --   --  35  ALKPHOS 130*  --  118*  --   --   --  71  BILITOT 0.4  --  0.3  --   --   --  <0.2*  PROT 5.1*  --  5.0*   --   --   --  5.0*  ALBUMIN 1.5*  < > 1.6*  < > 1.5* 1.6* 1.6*  < > = values in this interval not displayed.  CBC:  Recent Labs Lab 12/24/13 0400 12/25/13 0500 12/26/13 0420 12/27/13 0500 12/28/13 0530  WBC 4.5 5.1 5.8 5.1 5.6  HGB 8.5* 8.4* 8.2* 8.6* 8.7*  HCT 26.2* 26.2* 25.3* 27.2* 26.0*  MCV 101.6* 102.3* 102.8* 103.8* 99.2  PLT 48* 55* 70* 114* 148*   CBG:  Recent Labs Lab 12/25/13 0402 12/25/13 0752 12/25/13 1254 12/26/13 0746 12/28/13 0852  GLUCAP 116* 111* 93 84 69*   Studies/Results: No results found. Marland Kitchen antiseptic oral rinse  7 mL Mouth Rinse QID  .  chlorhexidine  15 mL Mouth Rinse BID  . heparin subcutaneous  5,000 Units Subcutaneous 3 times per day  . sodium chloride  10-40 mL Intracatheter Q12H  . valproate sodium  750 mg Intravenous 3 times per day    BMET    Component Value Date/Time   NA 146 12/28/2013 0530   K 4.0 12/28/2013 0530   CL 105 12/28/2013 0530   CO2 21 12/28/2013 0530   GLUCOSE 72 12/28/2013 0530   BUN 85* 12/28/2013 0530   CREATININE 8.42* 12/28/2013 0530   CALCIUM 8.2* 12/28/2013 0530   GFRNONAA 7* 12/28/2013 0530   GFRAA 8* 12/28/2013 0530   CBC    Component Value Date/Time   WBC 5.6 12/28/2013 0530   RBC 2.62* 12/28/2013 0530   RBC 4.28 09/21/2012 0436   HGB 8.7* 12/28/2013 0530   HCT 26.0* 12/28/2013 0530   PLT 148* 12/28/2013 0530   MCV 99.2 12/28/2013 0530   MCH 33.2 12/28/2013 0530   MCHC 33.5 12/28/2013 0530   RDW 14.4 12/28/2013 0530   LYMPHSABS 0.7 12/11/2013 0815   MONOABS 0.4 12/24/2013 0815   EOSABS 0.0 01/06/2014 0815   BASOSABS 0.0 01/02/2014 0815   Lab Results  Component Value Date   CREATININE 8.42* 12/28/2013   CREATININE 6.88* 12/27/2013   CREATININE 5.00* 12/26/2013   Assessment/Plan:  1. Oliguric AKI - worsening renal function this AM (6.88-->8.42) but UOP improved (925 yesterday) with increased IV fluids.  No po intake.  Will likely need to initiate IHD today. 2. High anion gap metabolic acidosis - AG 20, bicarb  21.  This is in the setting of worsening renal function.   3. Sepsis 2/2 to pyelonephritis - completed abx 4. Thrombocytopenia - HIT negative, plts improving. 5. Acute encephalopathy/seizures - plan per neurology  Evelena Peat, DO IMTS, PGY2 [phone] 12/28/2013 9:03AM  I have seen and examined this patient and agree with plan as outlined by Dr. Andrey Campanile.  Pt with spasticity/paraparesis of unclear etiology.  Despite multiple levels of DJD and cord compressions, neurosurgery did not feel that these explained his paraparesis/spasticity.  Overall, he has not significantly improved from a functional status and his cognitive impairment has also not improved.  He is starting to have increased UOP (now non-oliguric with 925cc), however will plan for HD again today due to increased BUN/cr.  Pt is not a suitable candidate for long-term dialysis and would recommend palliative care consult to help set goals/limits of care.  Hopefully will not need further HD after today.  Will cont to follow closely. Marybeth Dandy A,MD 12/28/2013 12:26 PM

## 2013-12-28 NOTE — Progress Notes (Signed)
Speech Language Pathology Treatment: Dysphagia  Patient Details Name: [name] MRN: [id number] DOB: [birth year] Today's Date: 12/28/2013 Time: [id number] SLP Time Calculation (min): 13 min  Assessment / Plan / Recommendation Clinical Impression  Diagnostic treatment focused on readiness for a po diet. Patient with poor sustained attention, poor awareness of environment despite max verbal and tactile cueing from SLP including repositioning and oral care to stimulate awareness of oral cavity. Given above, aspiration risk high. No po trials administered this date. Will continue to f/u.    HPI HPI: Pt is a 48 year old male with a history of developmental disability, admitted with pyelonephritis, septic shock and ARF. Pt intubated on 8/14, extubated 8/19 and having BiPAP overnight. Pt has had recurrent seizures and marked spasticity and discomfort of lower extremities with spasms. MD reports dysphagia in note, ?history. NO history of seeing SLP in notes.    Pertinent Vitals Pain Assessment: Faces Pain Score: 0-No pain Faces Pain Scale: No hurt  SLP Plan  Continue with current plan of care    Recommendations Diet recommendations: NPO Medication Administration: Via alternative means              Oral Care Recommendations:  (QID) Follow up Recommendations: Skilled Nursing facility Plan: Continue with current plan of care    GO   [facility] Maryan Sivak MA, CCC-SLP [phone]   Ferdinand LangoMcCoy [name] [name] 12/28/2013, 11:57 AM

## 2013-12-28 NOTE — Progress Notes (Signed)
[place] TEAM 1 - Stepdown/ICU TEAM Progress Note  [name] DGL:[id number] DOB: [birth year] DOA: 12/17/2013 PCP: Default, Provider, MD  Admit HPI / Brief Narrative: 48 y/o M SNF resident with PMH of developmental delay, DJD, O2 dependent COPD / Tobacco Abuse, OSA, Cervical / Lumbosacral spondylosis & recent admission to [facility] for back pain & falls who presented to [facility] on 12/19/2013 with fever and AMS. Reportedly he had a worsening mental status for 3-4 days prior to admission. Work up at [place] was remarkable for UTI, lactic acid 1.1, Na 140, K 5.1, AG 22, sr cr 8.95, Alk phos 106, CK 560, INR 1.2, WBC 9.6, Hgb 11.7. Given acute kidney injury, patient was transferred to [facility] for further care.   Upon arrival to [facility], the patient was assessed by [facility] and found to have concerns for posturing, inability to protect his airway, and shock and PCCM was emergently consulted for evaluation. Patient was intubated and HD catheter placed by PCCM.     Significant Events: 8/14 Admit to [facility] from [place], SNF resident with fever, AMS, hypotension. Intubated for airway protection. Vasopressors initiated  8/14 Neuro consult: Valproic acid initiated  8/14 Renal consult: CRRT initiated  8/15 Vasopressors weaned to off  8/16 ?recurrent seizures >> VPA level low  8/18 Off CRRT  8/19 Extubated  8/20 Transfer to SDU  HPI/Subjective: Pt is alert but still unable to communicate effectively.  He grumbles unintelligibly when questioned.  No family present.    Assessment/Plan:  Acute respiratory failure 2nd to septic shock Resolved - extubated 8/19  Septic shock 2nd to pyelonephritis multiple organism in urine cx 8/14 - completed 7 day course of Zosyn   AKI, anuric 2nd to pyelonephritis, septic shock, myelopathy and neurogenic bladder Nephrology following - no hydronephrosis from renal ultra sound - MRI noted distended bladder suggesting neurogenic bladder - foley cath -  see Nephrology notes   Thrombocytopenia HIT panel negative - plt count continues to climb - likely due to pyelo (gram negative infxn)  Spacticity/spasms of lower extremities Neurology following - records suggest pt was ambulating w/ a rolling walker as late as 11/28/2013 - pt had myelogram 7/30 apparently for this issue, with study noting most severe issue being "moderate" spinal stenosis at C3-C4 - has been evaluated by a Neurosurgeon    Acute Encephalopathy 2nd to seizures, sepsis, AKI Mental status slow to change/improve  COPD Well compensated at this time   Hx of OSA  Septic shock resolved 8/15  Bradycardia Resolved  Dyshpagia  Anemia of critical illness  Code Status: FULL Family Communication: no family present at time of exam Disposition Plan: SDU - Palliative Care consult Monday AM if no change in status over weekend   Consultants: Neurology Nephrology  Antibiotics: none  DVT prophylaxis: SQ heparin   Objective: Blood pressure 122/71, pulse 100, temperature 99 F (37.2 C), temperature source Axillary, resp. rate 17, height 5' 2"  (1.575 m), weight 78.7 kg (173 lb 8 oz), SpO2 100.00%.  Intake/Output Summary (Last 24 hours) at 12/28/13 1530 Last data filed at 12/28/13 0800  Gross per 24 hour  Intake   1660 ml  Output    675 ml  Net    985 ml   Exam: General: No acute respiratory distress - moaning seemingly randomly  Lungs: Clear to auscultation bilaterally without wheezes or crackles Cardiovascular: Regular rate and rhythm without murmur gallop or rub Abdomen: Nontender, nondistended, soft, bowel sounds positive, no rebound, no ascites, no  appreciable mass Extremities: No significant cyanosis, clubbing, or edema bilateral lower extremities - no evidence of pain w/ examination of legs Neuro:  Follows examiner w/ eyes- speech mumbling/garbled - no focal CN deficits noted - today pt is keeping legs and arms in a flexed posture  Data Reviewed: Basic  Metabolic Panel:  Recent Labs Lab 12/22/13 0404 12/22/13 1645 12/23/13 0400 12/23/13 1613 12/24/13 0400 12/25/13 0400 12/26/13 0420 12/27/13 0521 12/28/13 0530  NA 134* 137 139 137 138 142 139 146 146  K 4.2 4.3 4.3 4.3 4.2 4.1 3.8 4.0 4.0  CL 101 102 104 102 103 104 103 106 105  CO2 24 27 26 27 26 27 24 24 21   GLUCOSE 116* 88 83 81 84 106* 70 72 72  BUN 17 14 13 12 12  31* 53* 70* 85*  CREATININE 1.94* 1.59* 1.66* 1.47* 1.59* 3.04* 5.00* 6.88* 8.42*  CALCIUM 7.9* 8.1* 8.0* 8.0* 8.0* 8.3* 8.4 8.5 8.2*  MG 2.3  --  2.3  --  2.3  --   --   --   --   PHOS 2.9 2.0* 1.8* 1.9*  --  3.7  --  6.7*  --     Liver Function Tests:  Recent Labs Lab 12/22/13 0404  12/23/13 0400 12/23/13 1613 12/25/13 0400 12/27/13 0521 12/28/13 0530  AST 87*  --  92*  --   --   --  24  ALT 47  --  57*  --   --   --  35  ALKPHOS 130*  --  118*  --   --   --  71  BILITOT 0.4  --  0.3  --   --   --  <0.2*  PROT 5.1*  --  5.0*  --   --   --  5.0*  ALBUMIN 1.5*  < > 1.6* 1.6* 1.5* 1.6* 1.6*  < > = values in this interval not displayed.  Coags:  Recent Labs Lab 12/22/13 0404  INR 1.24   CBC:  Recent Labs Lab 12/24/13 0400 12/25/13 0500 12/26/13 0420 12/27/13 0500 12/28/13 0530  WBC 4.5 5.1 5.8 5.1 5.6  HGB 8.5* 8.4* 8.2* 8.6* 8.7*  HCT 26.2* 26.2* 25.3* 27.2* 26.0*  MCV 101.6* 102.3* 102.8* 103.8* 99.2  PLT 48* 55* 70* 114* 148*   CBG:  Recent Labs Lab 12/25/13 0752 12/25/13 1254 12/26/13 0746 12/28/13 0852 12/28/13 1010  GLUCAP 111* 93 84 69* 140*    Recent Results (from the past 240 hour(s))  MRSA PCR SCREENING     Status: None   Collection Time    12/18/2013  5:57 AM      Result Value Ref Range Status   MRSA by PCR NEGATIVE  NEGATIVE Final   Comment:            The GeneXpert MRSA Assay (FDA     approved for NASAL specimens     only), is one component of a     comprehensive MRSA colonization     surveillance program. It is not     intended to diagnose MRSA      infection nor to guide or     monitor treatment for     MRSA infections.  CULTURE, BLOOD (ROUTINE X 2)     Status: None   Collection Time    12/22/2013  8:20 AM      Result Value Ref Range Status   Specimen Description BLOOD CENTRAL LINE   Final   Special  Requests BOTTLES DRAWN AEROBIC ONLY [facility]   Final   Culture  Setup Time     Final   Value: 12/18/2013 13:45     Performed at [facility]   Culture     Final   Value: NO GROWTH 5 DAYS     Performed at [facility]   Report Status 12/26/2013 FINAL   Final  CLOSTRIDIUM DIFFICILE BY PCR     Status: None   Collection Time    12/22/2013  8:40 AM      Result Value Ref Range Status   C difficile by pcr NEGATIVE  NEGATIVE Final  URINE CULTURE     Status: None   Collection Time    12/31/2013  8:41 AM      Result Value Ref Range Status   Specimen Description URINE, RANDOM   Final   Special Requests NONE   Final   Culture  Setup Time     Final   Value: 12/12/2013 13:49     Performed at [place]     Final   Value: >=100,000 COLONIES/ML     Performed at [facility]   Culture     Final   Value: Multiple bacterial morphotypes present, none predominant. Suggest appropriate recollection if clinically indicated.     Performed at [facility]   Report Status 12/22/2013 FINAL   Final  CULTURE, BLOOD (ROUTINE X 2)     Status: None   Collection Time    12/31/2013  9:45 AM      Result Value Ref Range Status   Specimen Description BLOOD RIGHT HAND   Final   Special Requests BOTTLES DRAWN AEROBIC ONLY 4CC   Final   Culture  Setup Time     Final   Value: 12/17/2013 13:47     Performed at [facility]   Culture     Final   Value: NO GROWTH 5 DAYS     Performed at [facility]   Report Status 12/26/2013 FINAL   Final  CULTURE, RESPIRATORY (NON-EXPECTORATED)     Status: None   Collection Time    01/01/2014 10:37 AM      Result Value Ref Range Status   Specimen Description  ENDOTRACHEAL   Final   Special Requests Normal   Final   Gram Stain     Final   Value: RARE WBC PRESENT, PREDOMINANTLY MONONUCLEAR     RARE SQUAMOUS EPITHELIAL CELLS PRESENT     RARE GRAM POSITIVE COCCI     IN PAIRS IN CLUSTERS     Performed at [facility]   Culture     Final   Value: Non-Pathogenic Oropharyngeal-type Flora Isolated.     Performed at [facility]   Report Status 12/22/2013 FINAL   Final     Studies:  Recent x-ray studies have been reviewed in detail by the Attending Physician  Scheduled Meds:  Scheduled Meds: . antiseptic oral rinse  7 mL Mouth Rinse QID  . chlorhexidine  15 mL Mouth Rinse BID  . heparin subcutaneous  5,000 Units Subcutaneous 3 times per day  . sodium chloride  10-40 mL Intracatheter Q12H  . valproate sodium  750 mg Intravenous 3 times per day    Time spent on care of this patient: 35 mins   [name] T , MD   [facility] Office  [phone] Pager - Text Page per Shea Evans as per below:  On-Call/Text Page:      [email or web address]      password [email or web address]  If 7PM-7AM, please contact night-coverage [email or web address] Password [email or web address] 12/28/2013, 3:30 PM   LOS: 8 days

## 2013-12-28 NOTE — Progress Notes (Signed)
Hypoglycemic Event  CBG: 69  Treatment: 50% Dextrose IV  Symptoms: Lethargy,Sweating  Follow-up CBG: Time:1010 CBG Result:140  Possible Reasons for Event: NPO  Comments/MD notified: Dr Sharon SellerMcClung    Elianie Hubers, Tilford Pillarorothy Waithira  Remember to initiate Hypoglycemia Order Set & complete

## 2013-12-28 NOTE — Progress Notes (Signed)
RT tried to apply CPAP on patient and patient refused. RT will continue to monitor.

## 2013-12-28 NOTE — Procedures (Signed)
Patient was seen on dialysis and the procedure was supervised. BFR 300 Via temp RIJ trialysis cath BP is 129/75.  Patient appears to be tolerating treatment well.

## 2013-12-29 LAB — COMPREHENSIVE METABOLIC PANEL
ALT: 35 U/L (ref 0–53)
ANION GAP: 13 (ref 5–15)
AST: 24 U/L (ref 0–37)
Albumin: 1.7 g/dL — ABNORMAL LOW (ref 3.5–5.2)
Alkaline Phosphatase: 70 U/L (ref 39–117)
BUN: 39 mg/dL — ABNORMAL HIGH (ref 6–23)
CALCIUM: 8.3 mg/dL — AB (ref 8.4–10.5)
CO2: 28 mEq/L (ref 19–32)
Chloride: 101 mEq/L (ref 96–112)
Creatinine, Ser: 4.99 mg/dL — ABNORMAL HIGH (ref 0.50–1.35)
GFR calc non Af Amer: 13 mL/min — ABNORMAL LOW (ref >90)
GFR, EST AFRICAN AMERICAN: 15 mL/min — AB (ref >90)
GLUCOSE: 98 mg/dL (ref 70–99)
Potassium: 3.3 mEq/L — ABNORMAL LOW (ref 3.7–5.3)
Sodium: 142 mEq/L (ref 137–147)
Total Bilirubin: <0.2 mg/dL — ABNORMAL LOW (ref 0.3–1.2)
Total Protein: 5.1 g/dL — ABNORMAL LOW (ref 6.0–8.3)

## 2013-12-29 LAB — CBC
HCT: 27 % — ABNORMAL LOW (ref 39.0–52.0)
HEMOGLOBIN: 9.1 g/dL — AB (ref 13.0–17.0)
MCH: 32.9 pg (ref 26.0–34.0)
MCHC: 33.7 g/dL (ref 30.0–36.0)
MCV: 97.5 fL (ref 78.0–100.0)
Platelets: 142 10*3/uL — ABNORMAL LOW (ref 150–400)
RBC: 2.77 MIL/uL — ABNORMAL LOW (ref 4.22–5.81)
RDW: 14.1 % (ref 11.5–15.5)
WBC: 4.8 10*3/uL (ref 4.0–10.5)

## 2013-12-29 MED ORDER — OXYMETAZOLINE HCL 0.05 % NA SOLN
2.0000 | Freq: Once | NASAL | Status: DC
  Filled 2013-12-29: qty 15

## 2013-12-29 MED ORDER — NEPRO/CARBSTEADY PO LIQD
1000.0000 mL | ORAL | Status: DC
  Filled 2013-12-29 (×3): qty 1000

## 2013-12-29 MED ORDER — LIDOCAINE-PRILOCAINE 2.5-2.5 % EX CREA
1.0000 "application " | TOPICAL_CREAM | Freq: Once | CUTANEOUS | Status: DC
  Filled 2013-12-29: qty 5

## 2013-12-29 NOTE — Progress Notes (Signed)
Panda removed feeding tube.  Anxious and calling out.  Disoriented x 3 and communicated in short sentences, states "I want to go home". Does not appear to understand about current circumstances.

## 2013-12-29 NOTE — Progress Notes (Signed)
Speech Language Pathology Treatment: Dysphagia  Patient Details Name: [name] MRN: [id number] DOB: 1965/11/15 Today's Date: 12/29/2013 Time: 4098-1191 SLP Time Calculation (min): 9 min  Assessment / Plan / Recommendation Clinical Impression  Pt was seen to assess readiness for PO intake. SLP provided Total A for oral care via suctioning. Pt alert today, but continues to present with decreased sustained attention and awareness. SLP provided minimal ice chip trials for skilled observation of tolerance. Pt required Max cues for awareness of PO and bolus manipulation, with an immediate throat clear elicited with each trial. Recommend to continue NPO. SLP to continue to follow.   HPI HPI: Pt is a 48 year old male with a history of developmental disability, admitted with pyelonephritis, septic shock and ARF. Pt intubated on 8/14, extubated 8/19 and having BiPAP overnight. Pt has had recurrent seizures and marked spasticity and discomfort of lower extremities with spasms. MD reports dysphagia in note, ?history. NO history of seeing SLP in notes.    Pertinent Vitals Pain Assessment: Faces Faces Pain Scale: No hurt  SLP Plan  Continue with current plan of care    Recommendations Diet recommendations: NPO Medication Administration: Via alternative means              Oral Care Recommendations: Oral care Q4 per protocol Follow up Recommendations: Skilled Nursing facility;24 hour supervision/assistance Plan: Continue with current plan of care    GO      Maxcine Ham, M.A. CCC-SLP [phone]  Maxcine Ham 12/29/2013, 10:53 AM

## 2013-12-29 NOTE — Progress Notes (Signed)
S:Patient is responsive with short answers today.  He reports he feels "fine" but also seems confused reporting "I saw you earlier this morning." O:BP 132/63  Pulse 98  Temp(Src) 97.7 F (36.5 C) (Oral)  Resp 13  Ht  (1.575 m)  Wt 182 lb 1.6 oz (82.6 kg)  BMI 33.30 kg/m2  SpO2 100%  Intake/Output Summary (Last 24 hours) at 12/29/13 0932 Last data filed at 12/29/13 0529  Gross per 24 hour  Intake 810.84 ml  Output   1792 ml  Net -981.16 ml   Intake/Output: I/O last 3 completed shifts: In: 2250.8 [I.V.:1848.3; IV Piggyback:402.5] Out: 2167 [Urine:1150; Other:1017]  Intake/Output this shift:    Weight change: -10 lb 5.8 oz (-4.7 kg) ZOX:WRUEAVW in bed in NAD, poor eye contact CVS:RRR, no r/m/g Resp:CTAB UJW:JXBJ, NT, ND GU:  100cc clear yellow urine in bag Ext:no edema, non-tender   Recent Labs Lab 12/22/13 1645 12/23/13 0400 12/23/13 1613 12/24/13 0400 12/25/13 0400 12/26/13 0420 12/27/13 0521 12/28/13 0530 12/29/13 0315  NA 137 139 137 138 142 139 146 146 142  K 4.3 4.3 4.3 4.2 4.1 3.8 4.0 4.0 3.3*  CL 102 104 102 103 104 103 106 105 101  CO2 27 26 27 26 27 24 24 21 28   GLUCOSE 88 83 81 84 106* 70 72 72 98  BUN 31* 53* 70* 85* 39*  CREATININE 1.59* 1.66* 1.47* 1.59* 3.04* 5.00* 6.88* 8.42* 4.99*  ALBUMIN 1.7* 1.6* 1.6*  --  1.5*  --  1.6* 1.6* 1.7*  CALCIUM 8.1* 8.0* 8.0* 8.0* 8.3* 8.4 8.5 8.2* 8.3*  PHOS 2.0* 1.8* 1.9*  --  3.7  --  6.7*  --   --   AST  --  92*  --   --   --   --   --  24 24  ALT  --  57*  --   --   --   --   --  35 35   Liver Function Tests:  Recent Labs Lab 12/23/13 0400  12/27/13 0521 12/28/13 0530 12/29/13 0315  AST 92*  --   --  24 24  ALT 57*  --   --  35 35  ALKPHOS 118*  --   --  71 70  BILITOT 0.3  --   --  <0.2* <0.2*  PROT 5.0*  --   --  5.0* 5.1*  ALBUMIN 1.6*  < > 1.6* 1.6* 1.7*  < > = values in this interval not displayed.  CBC:  Recent Labs Lab 12/25/13 0500 12/26/13 0420 12/27/13 0500  12/28/13 0530 12/29/13 0312  WBC 5.1 5.8 5.1 5.6 4.8  HGB 8.4* 8.2* 8.6* 8.7* 9.1*  HCT 26.2* 25.3* 27.2* 26.0* 27.0*  MCV 102.3* 102.8* 103.8* 99.2 97.5  PLT 55* 70* 114* 148* 142*   CBG:  Recent Labs Lab 12/25/13 0752 12/25/13 1254 12/26/13 0746 12/28/13 0852 12/28/13 1010  GLUCAP 111* 93 84 69* 140*   Studies/Results: Dg Abd Portable 1v  12/28/2013   CLINICAL DATA:  Assess feeding tube positioning  EXAM: PORTABLE ABDOMEN - 1 VIEW  COMPARISON:  Portable abdominal film of 2014/01/03  FINDINGS: The radiodense tipped feeding tube lies in the proximal gastric gastric body. The bowel gas pattern is nonspecific. There is degenerative change of the lumbar spine. There is atelectasis at both lung bases.  IMPRESSION: The feeding tube tip lies in the region of the proximal gastric body.   Electronically Signed  By: David  [place]   On: 12/28/2013 18:42   . antiseptic oral rinse  7 mL Mouth Rinse QID  . chlorhexidine  15 mL Mouth Rinse BID  . heparin subcutaneous  5,000 Units Subcutaneous 3 times per day  . sodium chloride  10-40 mL Intracatheter Q12H  . valproate sodium  750 mg Intravenous 3 times per day    BMET    Component Value Date/Time   NA 142 12/29/2013 0315   K 3.3* 12/29/2013 0315   CL 101 12/29/2013 0315   CO2 28 12/29/2013 0315   GLUCOSE 98 12/29/2013 0315   BUN 39* 12/29/2013 0315   CREATININE 4.99* 12/29/2013 0315   CALCIUM 8.3* 12/29/2013 0315   GFRNONAA 13* 12/29/2013 0315   GFRAA 15* 12/29/2013 0315   CBC    Component Value Date/Time   WBC 4.8 12/29/2013 0312   RBC 2.77* 12/29/2013 0312   RBC 4.28 09/21/2012 0436   HGB 9.1* 12/29/2013 0312   HCT 27.0* 12/29/2013 0312   PLT 142* 12/29/2013 0312   MCV 97.5 12/29/2013 0312   MCH 32.9 12/29/2013 0312   MCHC 33.7 12/29/2013 0312   RDW 14.1 12/29/2013 0312   LYMPHSABS 0.7 12/23/2013 0815   MONOABS 0.4 12/19/2013 0815   EOSABS 0.0 12/21/2013 0815   BASOSABS 0.0 01/06/2014 0815   Lab Results  Component Value Date    CREATININE 4.99* 12/29/2013   CREATININE 8.42* 12/28/2013   CREATININE 6.88* 12/27/2013   Assessment/Plan:  1. AKI - patient underwent HD yesterday- SCr 4.99 today.  Not a good candidate for long term dialysis however UOP has improved (775 in last 24). Will continue to follow. 2. anion gap metabolic acidosis -resolved 3. Sepsis 2/2 to pyelonephritis - completed abx 4. Thrombocytopenia - HIT negative, plts improving. 5. Acute encephalopathy/seizures/ spasticity - still with significant cognitive impairment. plan per neurology  Gust Rung, DO  IM[facility], PGY2   I have seen and examined this patient and agree with plan as outlined by Dr. Mikey Bussing.  Pt more awake/alert and maintaing UOP.  Tolerated IHD yesterday but will hold off on more HD for now.  Hopefully his UOP will cont to improve because at present he is not a suitable candidate for long term dialysis. Shailey Butterbaugh A,MD 12/29/2013 1:51 PM

## 2013-12-29 NOTE — Progress Notes (Signed)
[place] TEAM 1 - Stepdown/ICU TEAM Progress Note  [name] GGY:[id number] DOB: 1965/11/02 DOA: 12/15/2013 PCP: Default, Provider, MD  Admit HPI / Brief Narrative: 48 y/o M SNF resident with PMH of developmental delay, DJD, O2 dependent COPD / Tobacco Abuse, OSA, Cervical / Lumbosacral spondylosis & recent admission to [facility] for back pain & falls who presented to [facility] on 12/30/2013 with fever and AMS. Reportedly he had a worsening mental status for 3-4 days prior to admission. Work up at [facility] was remarkable for UTI, lactic acid 1.1, Na 140, K 5.1, AG 22, sr cr 8.95, Alk phos 106, CK 560, INR 1.2, WBC 9.6, Hgb 11.7. Given acute kidney injury, patient was transferred to [facility] for further care.   Upon arrival to [facility], the patient was assessed by [facility] and found to have concerns for posturing, inability to protect his airway, and shock and PCCM was emergently consulted for evaluation. Patient was intubated and HD catheter placed by PCCM.     Significant Events: 8/14 Admit to [facility] from [place], SNF resident with fever, AMS, hypotension. Intubated for airway protection. Vasopressors initiated  8/14 Neuro consult: Valproic acid initiated  8/14 Renal consult: CRRT initiated  8/15 Vasopressors weaned to off  8/16 ?recurrent seizures >> VPA level low  8/18 Off CRRT  8/19 Extubated  8/20 Transfer to SDU  HPI/Subjective: Pt is more alert today, and will answer a few direct questions with one word answers.  Still can not provide a detailed hx.  NG has been placed.    Assessment/Plan:  Acute respiratory failure 2nd to septic shock Resolved - extubated 8/19  Septic shock 2nd to pyelonephritis multiple organism in urine cx 8/14 - completed 7 day course of Zosyn   AKI, anuric 2nd to pyelonephritis, septic shock, myelopathy and neurogenic bladder Nephrology following - no hydronephrosis from renal ultra sound - MRI noted distended bladder suggesting neurogenic  bladder - foley cath - see Nephrology notes   Thrombocytopenia HIT panel negative - plt count stabilizing - likely due to pyelo (gram negative infxn)  Spacticity/spasms of lower extremities Neurology following - records suggest pt was ambulating w/ a rolling walker as late as 11/28/2013 - pt had myelogram 7/30 for this issue, with study noting most severe issue being "moderate" spinal stenosis at C3-C4 - has been evaluated by a Neurosurgery earlier this month w/ full MRI of lumbar spine under anesthesia and it was not felt that surgical intervention was indicated   Acute Encephalopathy 2nd to seizures, sepsis, AKI Mental status slow to change/improve  COPD Well compensated at this time   Hx of OSA  Septic shock resolved 8/15  Bradycardia Resolved  Dyshpagia  Anemia of critical illness  Code Status: FULL Family Communication: no family present at time of exam Disposition Plan: SDU - Palliative Care consult Monday AM if no change in status over weekend   Consultants: Neurology Nephrology  Antibiotics: none  DVT prophylaxis: SQ heparin   Objective: Blood pressure 138/79, pulse 107, temperature 98 F (36.7 C), temperature source Oral, resp. rate 17, height 5' 2"  (1.575 m), weight 82.6 kg (182 lb 1.6 oz), SpO2 97.00%.  Intake/Output Summary (Last 24 hours) at 12/29/13 1406 Last data filed at 12/29/13 1358  Gross per 24 hour  Intake 810.84 ml  Output   1792 ml  Net -981.16 ml   Exam: General: No acute respiratory distress - alert but not conversant  Lungs: Clear to auscultation bilaterally without wheezes or crackles  Cardiovascular: Regular rate and rhythm without murmur gallop or rub Abdomen: Nontender, nondistended, soft, bowel sounds positive, no rebound, no ascites, no appreciable mass Extremities: No significant cyanosis, clubbing, or edema bilateral lower extremities  Neuro:  Follows examiner w/ eyes- speech mumbling/garbled - no focal CN deficits noted - today  pt is keeping legs and arms in a flexed posture - will move hands and feet at request - Babinski present B feet  Data Reviewed: Basic Metabolic Panel:  Recent Labs Lab 12/22/13 1645 12/23/13 0400 12/23/13 1613 12/24/13 0400 12/25/13 0400 12/26/13 0420 12/27/13 0521 12/28/13 0530 12/29/13 0315  NA 137 139 137 138 142 139 146 146 142  K 4.3 4.3 4.3 4.2 4.1 3.8 4.0 4.0 3.3*  CL 102 104 102 103 104 103 106 105 101  CO2 27 26 27 26 27 24 24 21 28   GLUCOSE 88 83 81 84 106* 70 72 72 98  BUN 14 13 12 12  31* 53* 70* 85* 39*  CREATININE 1.59* 1.66* 1.47* 1.59* 3.04* 5.00* 6.88* 8.42* 4.99*  CALCIUM 8.1* 8.0* 8.0* 8.0* 8.3* 8.4 8.5 8.2* 8.3*  MG  --  2.3  --  2.3  --   --   --   --   --   PHOS 2.0* 1.8* 1.9*  --  3.7  --  6.7*  --   --     Liver Function Tests:  Recent Labs Lab 12/23/13 0400 12/23/13 1613 12/25/13 0400 12/27/13 0521 12/28/13 0530 12/29/13 0315  AST 92*  --   --   --  24 24  ALT 57*  --   --   --  35 35  ALKPHOS 118*  --   --   --  71 70  BILITOT 0.3  --   --   --  <0.2* <0.2*  PROT 5.0*  --   --   --  5.0* 5.1*  ALBUMIN 1.6* 1.6* 1.5* 1.6* 1.6* 1.7*   Coags: No results found for this basename: PT, INR,  in the last 168 hours  CBC:  Recent Labs Lab 12/25/13 0500 12/26/13 0420 12/27/13 0500 12/28/13 0530 12/29/13 0312  WBC 5.1 5.8 5.1 5.6 4.8  HGB 8.4* 8.2* 8.6* 8.7* 9.1*  HCT 26.2* 25.3* 27.2* 26.0* 27.0*  MCV 102.3* 102.8* 103.8* 99.2 97.5  PLT 55* 70* 114* 148* 142*   CBG:  Recent Labs Lab 12/25/13 0752 12/25/13 1254 12/26/13 0746 12/28/13 0852 12/28/13 1010  GLUCAP 111* 93 84 69* 140*    Recent Results (from the past 240 hour(s))  MRSA PCR SCREENING     Status: None   Collection Time    12/29/2013  5:57 AM      Result Value Ref Range Status   MRSA by PCR NEGATIVE  NEGATIVE Final   Comment:            The GeneXpert MRSA Assay (FDA     approved for NASAL specimens     only), is one component of a     comprehensive MRSA  colonization     surveillance program. It is not     intended to diagnose MRSA     infection nor to guide or     monitor treatment for     MRSA infections.  CULTURE, BLOOD (ROUTINE X 2)     Status: None   Collection Time    12/19/2013  8:20 AM      Result Value Ref Range Status   Specimen Description BLOOD CENTRAL  LINE   Final   Special Requests BOTTLES DRAWN AEROBIC ONLY [facility]   Final   Culture  Setup Time     Final   Value: 12/31/2013 13:45     Performed at [facility]   Culture     Final   Value: NO GROWTH 5 DAYS     Performed at [facility]   Report Status 12/26/2013 FINAL   Final  CLOSTRIDIUM DIFFICILE BY PCR     Status: None   Collection Time    12/30/2013  8:40 AM      Result Value Ref Range Status   C difficile by pcr NEGATIVE  NEGATIVE Final  URINE CULTURE     Status: None   Collection Time    12/12/2013  8:41 AM      Result Value Ref Range Status   Specimen Description URINE, RANDOM   Final   Special Requests NONE   Final   Culture  Setup Time     Final   Value: 12/21/2013 13:49     Performed at [facility]     Final   Value: >=100,000 COLONIES/ML     Performed at [facility]   Culture     Final   Value: Multiple bacterial morphotypes present, none predominant. Suggest appropriate recollection if clinically indicated.     Performed at [facility]   Report Status 12/22/2013 FINAL   Final  CULTURE, BLOOD (ROUTINE X 2)     Status: None   Collection Time    12/29/2013  9:45 AM      Result Value Ref Range Status   Specimen Description BLOOD RIGHT HAND   Final   Special Requests BOTTLES DRAWN AEROBIC ONLY 4CC   Final   Culture  Setup Time     Final   Value: 12/17/2013 13:47     Performed at [facility]   Culture     Final   Value: NO GROWTH 5 DAYS     Performed at [facility]   Report Status 12/26/2013 FINAL   Final  CULTURE, RESPIRATORY (NON-EXPECTORATED)     Status: None   Collection Time      12/21/2013 10:37 AM      Result Value Ref Range Status   Specimen Description ENDOTRACHEAL   Final   Special Requests Normal   Final   Gram Stain     Final   Value: RARE WBC PRESENT, PREDOMINANTLY MONONUCLEAR     RARE SQUAMOUS EPITHELIAL CELLS PRESENT     RARE GRAM POSITIVE COCCI     IN PAIRS IN CLUSTERS     Performed at [facility]   Culture     Final   Value: Non-Pathogenic Oropharyngeal-type Flora Isolated.     Performed at [facility]   Report Status 12/22/2013 FINAL   Final     Studies:  Recent x-ray studies have been reviewed in detail by the Attending Physician  Scheduled Meds:  Scheduled Meds: . antiseptic oral rinse  7 mL Mouth Rinse QID  . chlorhexidine  15 mL Mouth Rinse BID  . heparin subcutaneous  5,000 Units Subcutaneous 3 times per day  . sodium chloride  10-40 mL Intracatheter Q12H  . valproate sodium  750 mg Intravenous 3 times per day    Time spent on care of this patient: 35 mins   [name] , MD   [facility] Office  [phone] Pager -  Text Page per Shea Evans as per below:  On-Call/Text Page:      [email or web address]      password [email or web address]  If 7PM-7AM, please contact night-coverage [email or web address] Password [email or web address] 12/29/2013, 2:06 PM   LOS: 9 days

## 2013-12-29 NOTE — Progress Notes (Signed)
Brief Nutrition Note  Consult received for enteral/tube feeding initiation and management.  Patient currently followed by RD. Underwent HD yesterday.   Adult Enteral Nutrition Protocol initiated. Full assessment to follow.  Admitting Dx: ACUTE RENAL FAILURE PYELONEPHRITIS FEVER  Body mass index is 33.3 kg/(m^2). Pt meets criteria for obesity, class I based on current BMI.  Labs:   Recent Labs Lab 12/23/13 0400 12/23/13 1613 12/24/13 0400 12/25/13 0400  12/27/13 0521 12/28/13 0530 12/29/13 0315  NA 139 137 138 142  < > 146 146 142  K 4.3 4.3 4.2 4.1  < > 4.0 4.0 3.3*  CL 104 102 103 104  < > 106 105 101  CO2 < > BUN 31*  < > 70* 85* 39*  CREATININE 1.66* 1.47* 1.59* 3.04*  < > 6.88* 8.42* 4.99*  CALCIUM 8.0* 8.0* 8.0* 8.3*  < > 8.5 8.2* 8.3*  MG 2.3  --  2.3  --   --   --   --   --   PHOS 1.8* 1.9*  --  3.7  --  6.7*  --   --   GLUCOSE 83 81 84 106*  < > 72 72 98  < > = values in this interval not displayed.  Linnell Fulling, RD, LDN Pager #: [phone] After-Hours Pager #: [phone]

## 2013-12-29 NOTE — Progress Notes (Signed)
Entered patients room to place on CPAP and pt refused. Will continue to monitor.

## 2013-12-30 ENCOUNTER — Inpatient Hospital Stay (HOSPITAL_COMMUNITY): Payer: Medicaid Other

## 2013-12-30 DIAGNOSIS — E876 Hypokalemia: Secondary | ICD-10-CM | POA: Diagnosis present

## 2013-12-30 LAB — COMPREHENSIVE METABOLIC PANEL
ALBUMIN: 1.7 g/dL — AB (ref 3.5–5.2)
ALK PHOS: 68 U/L (ref 39–117)
ALT: 34 U/L (ref 0–53)
AST: 25 U/L (ref 0–37)
Anion gap: 13 (ref 5–15)
BUN: 45 mg/dL — ABNORMAL HIGH (ref 6–23)
CHLORIDE: 104 meq/L (ref 96–112)
CO2: 27 mEq/L (ref 19–32)
Calcium: 8.2 mg/dL — ABNORMAL LOW (ref 8.4–10.5)
Creatinine, Ser: 6.42 mg/dL — ABNORMAL HIGH (ref 0.50–1.35)
GFR calc Af Amer: 11 mL/min — ABNORMAL LOW (ref >90)
GFR calc non Af Amer: 9 mL/min — ABNORMAL LOW (ref >90)
Glucose, Bld: 90 mg/dL (ref 70–99)
POTASSIUM: 3 meq/L — AB (ref 3.7–5.3)
Sodium: 144 mEq/L (ref 137–147)
Total Protein: 5.2 g/dL — ABNORMAL LOW (ref 6.0–8.3)

## 2013-12-30 LAB — CBC
HEMATOCRIT: 26.6 % — AB (ref 39.0–52.0)
Hemoglobin: 8.9 g/dL — ABNORMAL LOW (ref 13.0–17.0)
MCH: 32.6 pg (ref 26.0–34.0)
MCHC: 33.5 g/dL (ref 30.0–36.0)
MCV: 97.4 fL (ref 78.0–100.0)
Platelets: 136 10*3/uL — ABNORMAL LOW (ref 150–400)
RBC: 2.73 MIL/uL — ABNORMAL LOW (ref 4.22–5.81)
RDW: 14 % (ref 11.5–15.5)
WBC: 4.9 10*3/uL (ref 4.0–10.5)

## 2013-12-30 LAB — GLUCOSE, CAPILLARY
GLUCOSE-CAPILLARY: 116 mg/dL — AB (ref 70–99)
GLUCOSE-CAPILLARY: 124 mg/dL — AB (ref 70–99)
GLUCOSE-CAPILLARY: 109 mg/dL — AB (ref 70–99)
Glucose-Capillary: 92 mg/dL (ref 70–99)
Glucose-Capillary: 97 mg/dL (ref 70–99)

## 2013-12-30 MED ORDER — NEPRO/CARBSTEADY PO LIQD
1000.0000 mL | ORAL | Status: DC
  Administered 2013-12-30: 1000 mL
  Filled 2013-12-30 (×3): qty 1000

## 2013-12-30 MED ORDER — POTASSIUM CHLORIDE 20 MEQ/15ML (10%) PO LIQD
40.0000 meq | Freq: Once | ORAL | Status: AC
  Administered 2013-12-30: 40 meq via ORAL
  Filled 2013-12-30: qty 30

## 2013-12-30 MED ORDER — PRO-STAT SUGAR FREE PO LIQD
30.0000 mL | Freq: Two times a day (BID) | ORAL | Status: DC
  Administered 2013-12-30 – 2014-01-03 (×9): 30 mL
  Filled 2013-12-30 (×10): qty 30

## 2013-12-30 MED ORDER — BACLOFEN 5 MG HALF TABLET
5.0000 mg | ORAL_TABLET | Freq: Three times a day (TID) | ORAL | Status: DC
  Administered 2013-12-30 – 2014-01-01 (×6): 5 mg
  Filled 2013-12-30 (×9): qty 1

## 2013-12-30 NOTE — Progress Notes (Signed)
Patient UJ:[id number] D Schraeder      DOB: [birth year]      WGN:[id number]  Visited with patient who does not have capacity for decision making.  Left message for his sister to call to schedule time to talk or to talk by phone.  Jung Yurchak L. Ladona Ridgel, MD MBA The Palliative Medicine Team at [facility] Phone: [phone] Pager: [phone] ( Use team phone after hours)

## 2013-12-30 NOTE — Progress Notes (Signed)
NUTRITION CONSULT/FOLLOW UP  DOCUMENTATION CODES Per approved criteria  -Obesity Unspecified   INTERVENTION: Initiate Nepro formula at 15 ml/hr and increase by 10 ml every 4 hours to goal rate of 45 ml/hr with Prostat liquid protein 30 ml BID to provie 2144 kcals, 117 gm protein, 785 ml of free water RD to follow for nutrition care plan  NUTRITION DIAGNOSIS: Inadequate oral intake related to inability to eat as evidenced by NPO, ongoing   New Goal: Pt to meet >/= 90% of their estimated nutrition needs, currently unmet  Monitor:  TF regimen & tolerance, weight, labs, I/O's  ASSESSMENT: 48 y/o M, SNF Resident, with PMH of Developmental delay, DJD, O2 dependent COPD / Tobacco Abuse, OSA, Cervical / Lumbosacral spondylosis & recent admission to [facility] for back pain & falls who presented to [facility] on 12/28/2013 with fever and AMS. Reportedly he has had a worsening mental status for 3-4 days prior to admission. Work up at [facility] remarkable for UA positive for UTI. Given acute kidney injury, patient was transferred to [facility] for further care. Upon arrival to [facility], the patient was assessed by [facility] and found to have concerns for posturing and inability to protect his airway and shock and PCCM emergently consulted for evaluation. Patient was intubated and HD catheter placed after assessment.  Patient s/p EEG 8/17.  Patient extubated 8/19.  TF (Nepro formula) discontinued via OGT with extubation.  S/p bedside swallow evaluation 8/20.  Pt demonstrated evidenced of moderate to severe dysphagia and recommending NPO status at this time.  Panda tube in place (tip in fundal region of stomach).  Patient continues to receive IHD.  RD consulted for TF initiation & management.  Height: Ht Readings from Last 1 Encounters:  12/26/13  (1.575 m)    Weight: -----> fluctuating  Wt Readings from Last 1 Encounters:  12/30/13 164 lb 3.9 oz (74.5 kg)  Admit wt          191 lb  (86.8 kg)  BMI:  Body mass index is 30.03 kg/(m^2).  Re-estimated Nutritional Needs: Kcal: 2000-2200 Protein: 110-120 gm Fluid: per MD  Skin: Intact  Diet Order: NPO   Intake/Output Summary (Last 24 hours) at 12/30/13 1008 Last data filed at 12/30/13 0938  Gross per 24 hour  Intake  347.5 ml  Output   1375 ml  Net -1027.5 ml   Labs:   Recent Labs Lab 12/23/13 1613 12/24/13 0400 12/25/13 0400  12/27/13 0521 12/28/13 0530 12/29/13 0315 12/30/13 0313  NA 137 138 142  < > 146 146 142 144  K 4.3 4.2 4.1  < > 4.0 4.0 3.3* 3.0*  CL 102 103 104  < > 106 105 101 104  CO2 < > BUN 12 12 31*  < > 70* 85* 39* 45*  CREATININE 1.47* 1.59* 3.04*  < > 6.88* 8.42* 4.99* 6.42*  CALCIUM 8.0* 8.0* 8.3*  < > 8.5 8.2* 8.3* 8.2*  MG  --  2.3  --   --   --   --   --   --   PHOS 1.9*  --  3.7  --  6.7*  --   --   --   GLUCOSE 81 84 106*  < > 72 72 98 90  < > = values in this interval not displayed.  CBG (last 3)   Recent Labs  12/28/13 1010 12/30/13 0049 12/30/13 0742  GLUCAP 140*  97 92    Scheduled Meds: . antiseptic oral rinse  7 mL Mouth Rinse QID  . chlorhexidine  15 mL Mouth Rinse BID  . feeding supplement (NEPRO CARB STEADY)  1,000 mL Per Tube Q24H  . heparin subcutaneous  5,000 Units Subcutaneous 3 times per day  . potassium chloride  40 mEq Oral Once  . sodium chloride  10-40 mL Intracatheter Q12H  . valproate sodium  750 mg Intravenous 3 times per day    Continuous Infusions: . dextrose 5 % and 0.45% NaCl 1,000 mL (12/30/13 0825)    Past Medical History  Diagnosis Date  . Acute respiratory failure   . Hypoxemia   . Encephalopathy, unspecified   . Pain in limb   . Hyperosmolality and/or hypernatremia   . Dehydration   . Altered mental status   . Other specific developmental learning difficulties   . Acidosis   . Gallstone ileus   . Unspecified vitamin B deficiency   . Tobacco use disorder   . Acute conjunctivitis, unspecified    . Other chronic allergic conjunctivitis   . Unspecified essential hypertension   . Cellulitis and abscess of unspecified site   . Muscle weakness (generalized)   . Cognitive communication deficit   . COPD (chronic obstructive pulmonary disease)   . Sleep apnea   . 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 Acute respiratory failure with hypoxia 09/20/2012  . Acute respiratory acidosis 09/20/2012  . Cellulitis of left lower extremity 09/20/2012  . OSA (obstructive sleep apnea) 09/20/2012  . Lumbosacral spondylosis without myelopathy 12/03/2013  . Degenerative joint disease of spine 12/03/2013  . Cervical spondylosis with myelopathy 12/03/2013    Past Surgical History  Procedure Laterality Date  . Eye surgery      cataracts  . Radiology with anesthesia N/A 12/09/2013    Procedure: MRI OF LUMBAR W/WO CONTRAST ;  Surgeon: Medication Radiologist, MD;  Location: [facility] OR;  Service: Radiology;  Laterality: N/A;    Maureen Chatters, RD, LDN Pager #: [phone] After-Hours Pager #: [phone]

## 2013-12-30 NOTE — Progress Notes (Signed)
I have personally seen and examined this patient and agree with the assessment/plan as outlined above by Mikey Bussing DO (PGY2). Renal function slightly worse today with improved UOP. No acute HD needs noted at this time. WOULD BE A POOR CHRONIC DIALYSIS PATIENT WITH HIS PHYSICAL LIMITATIONS.   Zev Blue K.,MD 12/30/2013 10:19 AM

## 2013-12-30 NOTE — Procedures (Signed)
Objective Swallowing Evaluation: Modified Barium Swallowing Study  Patient Details  Name: [name] MRN: [id number] Date of Birth: 07-08-1965  Today's Date: 12/30/2013 Time: 1430-1506 SLP Time Calculation (min): 36 min  Past Medical History:  Past Medical History  Diagnosis Date  . Acute respiratory failure   . Hypoxemia   . Encephalopathy, unspecified   . Pain in limb   . Hyperosmolality and/or hypernatremia   . Dehydration   . Altered mental status   . Other specific developmental learning difficulties   . Acidosis   . Gallstone ileus   . Unspecified vitamin B deficiency   . Tobacco use disorder   . Acute conjunctivitis, unspecified   . Other chronic allergic conjunctivitis   . Unspecified essential hypertension   . Cellulitis and abscess of unspecified site   . Muscle weakness (generalized)   . Cognitive communication deficit   . COPD (chronic obstructive pulmonary disease)   . Sleep apnea   . 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 Acute respiratory failure with hypoxia 09/20/2012  . Acute respiratory acidosis 09/20/2012  . Cellulitis of left lower extremity 09/20/2012  . OSA (obstructive sleep apnea) 09/20/2012  . Lumbosacral spondylosis without myelopathy 12/03/2013  . Degenerative joint disease of spine 12/03/2013  . Cervical spondylosis with myelopathy 12/03/2013   Past Surgical History:  Past Surgical History  Procedure Laterality Date  . Eye surgery      cataracts  . Radiology with anesthesia N/A 12/09/2013    Procedure: MRI OF LUMBAR W/WO CONTRAST ;  Surgeon: Medication Radiologist, MD;  Location: [facility] OR;  Service: Radiology;  Laterality: N/A;   HPI:  Pt is a 48 year old male with a history of developmental disability, admitted with pyelonephritis, septic shock and ARF. Pt intubated on 8/14, extubated 8/19 and having BiPAP overnight. Pt has had recurrent seizures and marked spasticity and discomfort of lower extremities with spasms. MD reports dysphagia in note,  ?history. NO history of seeing SLP in notes.      Assessment / Plan / Recommendation Clinical Impression  Dysphagia Diagnosis: Moderate oral phase dysphagia;Moderate pharyngeal phase dysphagia Clinical impression: Study was limited due to patient's inability to remain in upright position (very difficult to view pharynx and esophagus due to pt leaning forward and extraneous movements).  Pt was able to swallow purees and nectar thick liquids withour aspiration or penetration.  Thin liquids were penetrated, with no cough or throat clearing.  Solids and pill were not given due to pt's inability to cooperate/participate. Pt would likely aspirate thin liquids if reclined.  Appears to be safe for purees and nectar thick liquids if he is alert.    Treatment Recommendation  Therapy as outlined in treatment plan below    Diet Recommendation Dysphagia 1 (Puree);Nectar-thick liquid   Liquid Administration via: Spoon;No straw Medication Administration: Crushed with puree Supervision: Full supervision/cueing for compensatory strategies Compensations: Slow rate;Small sips/bites;Check for anterior loss Postural Changes and/or Swallow Maneuvers: Seated upright 90 degrees (May be fed at 80 degree angle)    Other  Recommendations Oral Care Recommendations: Oral care Q4 per protocol Other Recommendations: Order thickener from pharmacy;Prohibited food (jello, ice cream, thin soups);Clarify dietary restrictions   Follow Up Recommendations  Skilled Nursing facility;24 hour supervision/assistance    Frequency and Duration min 2x/week  2 weeks   Pertinent Vitals/Pain HR increased to 147 just after moving to chair, but returned quickly to baseline.  Cries out in pain when moved (unable to use pain scale).  General HPI: Pt is a 48 year old male with a history of developmental disability, admitted with pyelonephritis, septic shock and ARF. Pt intubated on 8/14, extubated 8/19 and having BiPAP overnight.  Pt has had recurrent seizures and marked spasticity and discomfort of lower extremities with spasms. MD reports dysphagia in note, ?history. NO history of seeing SLP in notes.  Type of Study: Modified Barium Swallowing Study Reason for Referral: Objectively evaluate swallowing function Previous Swallow Assessment: 12/27/2013 Temperature Spikes Noted: Yes (100.7) Respiratory Status: Nasal cannula History of Recent Intubation: Yes Length of Intubations (days): 6 days Date extubated: 12/25/13 Behavior/Cognition: Alert;Confused;Uncooperative;Distractible;Requires cueing;Doesn't follow directions;Decreased sustained attention Oral Cavity - Dentition: Poor condition;Missing dentition Oral Motor / Sensory Function: Within functional limits Self-Feeding Abilities: Total assist Patient Positioning: Upright in chair Baseline Vocal Quality: Low vocal intensity Volitional Cough: Weak Anatomy:  (Contractures prevent full upright position (leaning forward)) Pharyngeal Secretions: Not observed secondary MBS    Reason for Referral Objectively evaluate swallowing function   Oral Phase Oral Preparation/Oral Phase Oral Phase: Impaired Oral - Nectar Oral - Nectar Teaspoon: Right anterior bolus loss Oral - Nectar Cup: Right anterior bolus loss   Pharyngeal Phase Pharyngeal Phase Pharyngeal Phase: Impaired Pharyngeal - Thin Pharyngeal - Thin Teaspoon: Penetration/Aspiration during swallow Penetration/Aspiration details (thin teaspoon): Material enters airway, remains ABOVE vocal cords and not ejected out  Cervical Esophageal Phase    GO    Cervical Esophageal Phase Cervical Esophageal Phase: Vicente Masson T 12/30/2013, 2:16 PM

## 2013-12-30 NOTE — Progress Notes (Signed)
Subjective: Patient had no complaints. No recurrence of seizure activity reported.  Objective: Current vital signs: BP 143/67  Pulse 103  Temp(Src) 99.3 F (37.4 C) (Axillary)  Resp 18  Ht $Remove .575 m)  Wt 74.5 kg (164 lb 3.9 oz)  BMI 30.03 kg/m2  SpO2 100%  Neurologic Exam: Slightly lethargic. Follows commands well. Extraocular movements remain absent. Patient visually tracks with head movement. No facial weakness noted. Strength of upper extremities was normal. Marked spasticity of lower extremities is unchanged.  Medications: I have reviewed the patient's current medications.  Assessment/Plan: Recommend a trial of baclofen or lower extremity spasticity starting at 5 mg every 8 hours by oral route. Also recommend this therapy evaluation and intervention.  C.R. Roseanne Reno, MD [facility] [phone]  12/30/2013  9:19 AM

## 2013-12-30 NOTE — Progress Notes (Signed)
**Note Joshua-Identified via Obfuscation** Speech Language Pathology Treatment: Dysphagia  Patient Details Name: [name] MRN: [id number] DOB: [birth year] Today's Date: 12/30/2013 Time: 4098-1191 SLP Time Calculation (min): 38 min  Assessment / Plan / Recommendation Clinical Impression  Reassessed swallow at bedside, to determine if pt is able to participate in MBS.  Pt requires max assist to rouse sufficiently for po's, but appears to be more aware of the bolus orally.  Pt was able to swallow applesauce (multiple swallows noted with each bite), swallowed nectar thick juice with intermittent throat clearing noted, and chewed ice chips and swallowed with delayed cough observed.  Pt agrees to participate in [facility] for objective study of swallow function.  Scheduled MBS for today at 1300.   HPI HPI: Pt is a 48 year old male with a history of developmental disability, admitted with pyelonephritis, septic shock and ARF. Pt intubated on 8/14, extubated 8/19 and having BiPAP overnight. Pt has had recurrent seizures and marked spasticity and discomfort of lower extremities with spasms. MD reports dysphagia in note, ?history. NO history of seeing SLP in notes.    Pertinent Vitals Pain Assessment: No/denies pain  SLP Plan  MBS;New goals to be determined pending instrumental study    Recommendations Diet recommendations: NPO              Oral Care Recommendations: Oral care Q4 per protocol Follow up Recommendations: Skilled Nursing facility;24 hour supervision/assistance Plan: MBS;New goals to be determined pending instrumental study    GO     Maryjo Rochester T 12/30/2013, 11:23 AM

## 2013-12-30 NOTE — Progress Notes (Signed)
[place] TEAM 1 - Stepdown/ICU TEAM Progress Note  [name] HFW:[id number] DOB: [birth year] DOA: 12/16/2013 PCP: Default, Provider, MD  Admit HPI / Brief Narrative: 48 y/o M SNF resident with PMH of developmental delay, DJD, O2 dependent COPD / Tobacco Abuse, OSA, Cervical / Lumbosacral spondylosis & recent admission to [facility] for back pain & falls who presented to [facility] on 12/19/2013 with fever and AMS. Reportedly he had a worsening mental status for 3-4 days prior to admission. Work up at [facility] was remarkable for UTI, lactic acid 1.1, Na 140, K 5.1, AG 22, sr cr 8.95, Alk phos 106, CK 560, INR 1.2, WBC 9.6, Hgb 11.7. Given acute kidney injury, patient was transferred to [facility] for further care.   Upon arrival to [facility], the patient was assessed by [facility] and found to have concerns for posturing, inability to protect his airway, and shock and PCCM was emergently consulted for evaluation. Patient was intubated and HD catheter placed by PCCM.     Pt was treated aggressively for septic shock due to pyelonephritis. Pressors were eventually weaned and dc'd. He also required CRRT for AKI but unfortunately his renal function has not recovered and has required PRN dialysis since then. He has failed swallowing evaluations and requires enteral feeds via tube. He has also been very confused and has required restraints. He is not a good candidate for OP HD due to mentation and nonambulatory status in the setting of severe progressive functional decline. Palliative care has been consulted to discuss goals of care with the family.  Significant Events: 8/14 Admit to [facility] from [place], SNF resident with fever, AMS, hypotension. Intubated for airway protection. Vasopressors initiated  8/14 Neuro consult: Valproic acid initiated  8/14 Renal consult: CRRT initiated  8/15 Vasopressors weaned to off  8/16 ?recurrent seizures >> VPA level low  8/18 Off CRRT  8/19 Extubated  8/20  Transfer to SDU 8/24 Palliative care consulted  HPI/Subjective: Sleepy today but awakened during exam - nonverbal.  Assessment/Plan:  Acute respiratory failure 2nd to septic shock Resolved - extubated 8/19 - sepsis physiology has also reslved  Septic shock 2nd to pyelonephritis multiple organism in urine cx 8/14 - has completed 7 day course of Zosyn   AKI, anuric 2nd to pyelonephritis, septic shock, myelopathy and neurogenic bladder Nephrology following - no hydronephrosis from renal ultra sound - MRI noted distended bladder suggesting neurogenic bladder - foley cath - see Nephrology notes - poor candidate for long term HD  Hypokalemia replete per tube gently - follow lytes  Thrombocytopenia HIT panel negative - plt count stabilizing - likely due to pyelo (gram negative infxn)  Spacticity/spasms of lower extremities Neurology following - records suggest pt was ambulating w/ a rolling walker as late as 11/28/2013 - pt had myelogram 7/30 for this issue, with study noting most severe issue being "moderate" spinal stenosis at C3-C4 - has been evaluated by a Neurosurgery earlier this month w/ full MRI of lumbar spine under anesthesia and it was not felt that surgical intervention was indicated - initiate baclofen and follow   Acute Encephalopathy 2nd to seizures, sepsis, AKI Mental status slow to change/improve - seems to wax and wane with alertness  COPD Well compensated at this time -no wheezing  Hx of OSA  Bradycardia Resolved  Dysphagia Continue enteric feeds via tube- not a great candidate for PEG  Anemia of critical illness -Hgb stable around 8.5-9-baseline hgb ~11  Code Status: FULL Family Communication: no family  present at time of exam-updated sister Delila Spence on 8/24 Disposition Plan: SDU - Palliative Care consult   Consultants: Neurology Nephrology  Antibiotics: none  DVT prophylaxis: SQ heparin   Objective: Blood pressure 143/67, pulse 103,  temperature 99.3 F (37.4 C), temperature source Axillary, resp. rate 18, height 5' 2"  (1.575 m), weight 164 lb 3.9 oz (74.5 kg), SpO2 100.00%.  Intake/Output Summary (Last 24 hours) at 12/30/13 1020 Last data filed at 12/30/13 4944  Gross per 24 hour  Intake  347.5 ml  Output   1375 ml  Net -1027.5 ml   Exam: General: No acute respiratory distress - lethargic but does awaken with tactile stimulus Lungs: Clear to auscultation bilaterally without wheezes or crackles Cardiovascular: Regular rate and rhythm without murmur gallop or rub Abdomen: Nontender, nondistended, soft, bowel sounds positive, no rebound, no ascites, no appreciable mass Extremities: No significant cyanosis, clubbing, or edema bilateral lower extremities  Neuro: Speech mumbling/garbled when finally awakened - no focal CN deficits noted - keeps legs and arms in a flexed posture -- Babinski present B feet  Data Reviewed: Basic Metabolic Panel:  Recent Labs Lab 12/23/13 1613 12/24/13 0400 12/25/13 0400 12/26/13 0420 12/27/13 0521 12/28/13 0530 12/29/13 0315 12/30/13 0313  NA 137 138 142 139 146 146 142 144  K 4.3 4.2 4.1 3.8 4.0 4.0 3.3* 3.0*  CL 102 103 104 103 106 105 101 104  CO2 27 26 27 24 24 21 28 27   GLUCOSE 81 84 106* 70 72 72 98 90  BUN 12 12 31* 53* 70* 85* 39* 45*  CREATININE 1.47* 1.59* 3.04* 5.00* 6.88* 8.42* 4.99* 6.42*  CALCIUM 8.0* 8.0* 8.3* 8.4 8.5 8.2* 8.3* 8.2*  MG  --  2.3  --   --   --   --   --   --   PHOS 1.9*  --  3.7  --  6.7*  --   --   --     Liver Function Tests:  Recent Labs Lab 12/25/13 0400 12/27/13 0521 12/28/13 0530 12/29/13 0315 12/30/13 0313  AST  --   --  24 24 25   ALT  --   --  35 35 34  ALKPHOS  --   --  71 70 68  BILITOT  --   --  <0.2* <0.2* <0.2*  PROT  --   --  5.0* 5.1* 5.2*  ALBUMIN 1.5* 1.6* 1.6* 1.7* 1.7*   CBC:  Recent Labs Lab 12/26/13 0420 12/27/13 0500 12/28/13 0530 12/29/13 0312 12/30/13 0313  WBC 5.8 5.1 5.6 4.8 4.9  HGB 8.2* 8.6*  8.7* 9.1* 8.9*  HCT 25.3* 27.2* 26.0* 27.0* 26.6*  MCV 102.8* 103.8* 99.2 97.5 97.4  PLT 70* 114* 148* 142* 136*   CBG:  Recent Labs Lab 12/26/13 0746 12/28/13 0852 12/28/13 1010 12/30/13 0049 12/30/13 0742  GLUCAP 84 69* 140* 97 92    Recent Results (from the past 240 hour(s))  CULTURE, RESPIRATORY (NON-EXPECTORATED)     Status: None   Collection Time    12/30/2013 10:37 AM      Result Value Ref Range Status   Specimen Description ENDOTRACHEAL   Final   Special Requests Normal   Final   Gram Stain     Final   Value: RARE WBC PRESENT, PREDOMINANTLY MONONUCLEAR     RARE SQUAMOUS EPITHELIAL CELLS PRESENT     RARE GRAM POSITIVE COCCI     IN PAIRS IN CLUSTERS     Performed at [facility]   Culture     Final   Value: Non-Pathogenic Oropharyngeal-type Flora Isolated.     Performed at [facility]   Report Status 12/22/2013 FINAL   Final     Studies:  Recent x-ray studies have been reviewed in detail by the Attending Physician  Scheduled Meds:  Scheduled Meds: . antiseptic oral rinse  7 mL Mouth Rinse QID  . chlorhexidine  15 mL Mouth Rinse BID  . feeding supplement (NEPRO CARB STEADY)  1,000 mL Per Tube Q24H  . heparin subcutaneous  5,000 Units Subcutaneous 3 times per day  . potassium chloride  40 mEq Oral Once  . sodium chloride  10-40 mL Intracatheter Q12H  . valproate sodium  750 mg Intravenous 3 times per day    Time spent on care of this patient: 35 mins   ELLIS,ALLISON L. , ANP   [facility] Office  [phone] Pager - Text Page per Shea Evans as per below:  On-Call/Text Page:      [email or web address]      password [email or web address]  If 7PM-7AM, please contact night-coverage [email or web address] Password [email or web address] 12/30/2013, 10:20 AM   LOS: 10 days   I have personally examined this patient and reviewed the entire database. I have reviewed the above note, made any necessary editorial changes, and agree with its content.  Cherene Altes, MD [facility]

## 2013-12-30 NOTE — Progress Notes (Signed)
S:Patient still with limited responsiveness, does report he is doing well, and denies any complaints. O:BP 143/67  Pulse 103  Temp(Src) 99.3 F (37.4 C) (Axillary)  Resp 18  Ht  (1.575 m)  Wt 164 lb 3.9 oz (74.5 kg)  BMI 30.03 kg/m2  SpO2 100%  Intake/Output Summary (Last 24 hours) at 12/30/13 0844 Last data filed at 12/30/13 0520  Gross per 24 hour  Intake  347.5 ml  Output   1175 ml  Net -827.5 ml   Intake/Output: I/O last 3 completed shifts: In: 1062.5 [I.V.:890; IV Piggyback:172.5] Out: 1500 [Urine:1500]  Intake/Output this shift:    Weight change:  KGM:WNUUVOZ in bed in NAD, NGT in place CVS:RRR, no r/m/g Resp:CTAB DGU:YQIH, NT, ND Ext:no edema, non-tender   Recent Labs Lab 12/23/13 1613 12/24/13 0400 12/25/13 0400 12/26/13 0420 12/27/13 0521 12/28/13 0530 12/29/13 0315 12/30/13 0313  NA 137 138 142 139 146 146 142 144  K 4.3 4.2 4.1 3.8 4.0 4.0 3.3* 3.0*  CL 102 103 104 103 106 105 101 104  CO2 GLUCOSE 81 84 106* 70 72 72 98 90  BUN 12 12 31* 53* 70* 85* 39* 45*  CREATININE 1.47* 1.59* 3.04* 5.00* 6.88* 8.42* 4.99* 6.42*  ALBUMIN 1.6*  --  1.5*  --  1.6* 1.6* 1.7* 1.7*  CALCIUM 8.0* 8.0* 8.3* 8.4 8.5 8.2* 8.3* 8.2*  PHOS 1.9*  --  3.7  --  6.7*  --   --   --   AST  --   --   --   --   --  ALT  --   --   --   --   --  35 35 34   Liver Function Tests:  Recent Labs Lab 12/28/13 0530 12/29/13 0315 12/30/13 0313  AST ALT 35 35 34  ALKPHOS 71 70 68  BILITOT <0.2* <0.2* <0.2*  PROT 5.0* 5.1* 5.2*  ALBUMIN 1.6* 1.7* 1.7*    CBC:  Recent Labs Lab 12/26/13 0420 12/27/13 0500 12/28/13 0530 12/29/13 0312 12/30/13 0313  WBC 5.8 5.1 5.6 4.8 4.9  HGB 8.2* 8.6* 8.7* 9.1* 8.9*  HCT 25.3* 27.2* 26.0* 27.0* 26.6*  MCV 102.8* 103.8* 99.2 97.5 97.4  PLT 70* 114* 148* 142* 136*   CBG:  Recent Labs Lab 12/26/13 0746 12/28/13 0852 12/28/13 1010 12/30/13 0049 12/30/13 0742  GLUCAP 84 69* 140*  97 92   Studies/Results: Dg Abd Portable 1v  12/30/2013   CLINICAL DATA:  Feeding tube placement.  EXAM: PORTABLE ABDOMEN - 1 VIEW  COMPARISON:  12/28/2013.  FINDINGS: The feeding tube tip is in the fundal region of the stomach. Stable bowel gas pattern. No definite free air.  IMPRESSION: The feeding tube tip is in the fundal region of the stomach.   Electronically Signed   By: Loralie Champagne M.D.   On: 12/30/2013 07:43   Dg Abd Portable 1v  12/28/2013   CLINICAL DATA:  Assess feeding tube positioning  EXAM: PORTABLE ABDOMEN - 1 VIEW  COMPARISON:  Portable abdominal film of 01/16/2014  FINDINGS: The radiodense tipped feeding tube lies in the proximal gastric gastric body. The bowel gas pattern is nonspecific. There is degenerative change of the lumbar spine. There is atelectasis at both lung bases.  IMPRESSION: The feeding tube tip lies in the region of the proximal gastric body.   Electronically Signed   By: Onalee Hua  [place]   On: 12/28/2013 18:42   . antiseptic oral rinse  7 mL Mouth Rinse QID  . chlorhexidine  15 mL Mouth Rinse BID  . feeding supplement (NEPRO CARB STEADY)  1,000 mL Per Tube Q24H  . heparin subcutaneous  5,000 Units Subcutaneous 3 times per day  . potassium chloride  40 mEq Oral Once  . sodium chloride  10-40 mL Intracatheter Q12H  . valproate sodium  750 mg Intravenous 3 times per day    BMET    Component Value Date/Time   NA 144 12/30/2013 0313   K 3.0* 12/30/2013 0313   CL 104 12/30/2013 0313   CO2 27 12/30/2013 0313   GLUCOSE 90 12/30/2013 0313   BUN 45* 12/30/2013 0313   CREATININE 6.42* 12/30/2013 0313   CALCIUM 8.2* 12/30/2013 0313   GFRNONAA 9* 12/30/2013 0313   GFRAA 11* 12/30/2013 0313   CBC    Component Value Date/Time   WBC 4.9 12/30/2013 0313   RBC 2.73* 12/30/2013 0313   RBC 4.28 09/21/2012 0436   HGB 8.9* 12/30/2013 0313   HCT 26.6* 12/30/2013 0313   PLT 136* 12/30/2013 0313   MCV 97.4 12/30/2013 0313   MCH 32.6 12/30/2013 0313   MCHC 33.5 12/30/2013 0313    RDW 14.0 12/30/2013 0313   LYMPHSABS 0.7 01-14-14 0815   MONOABS 0.4 14-Jan-2014 0815   EOSABS 0.0 14-Jan-2014 0815   BASOSABS 0.0 2014-01-14 0815   Lab Results  Component Value Date   CREATININE 6.42* 12/30/2013   CREATININE 4.99* 12/29/2013   CREATININE 8.42* 12/28/2013   Assessment/Plan:  1. AKI -  SCr trending up to 6.4 today. however UOP has improved (1.1L in last 24) Not a good candidate for long term dialysis . No Uremic symptoms at this time.  Will hold off dialysis today but may need tomorrow if renal function does not improve.  Agree with palliative care consult 2. anion gap metabolic acidosis -resolved 3. Sepsis 2/2 to pyelonephritis - completed abx 4. Thrombocytopenia - HIT negative, plts improving. 5. Malnutrition: NGT in place for feeding 6. Acute encephalopathy/seizures/ spasticity - Improved still with significant cognitive impairment. Not a Neurosx candidate, plan per neurology  Gust Rung, DO  [facility], PGY2

## 2013-12-31 DIAGNOSIS — I498 Other specified cardiac arrhythmias: Secondary | ICD-10-CM

## 2013-12-31 DIAGNOSIS — M62838 Other muscle spasm: Secondary | ICD-10-CM

## 2013-12-31 DIAGNOSIS — E876 Hypokalemia: Secondary | ICD-10-CM

## 2013-12-31 LAB — BASIC METABOLIC PANEL
ANION GAP: 14 (ref 5–15)
BUN: 56 mg/dL — ABNORMAL HIGH (ref 6–23)
CHLORIDE: 108 meq/L (ref 96–112)
CO2: 26 mEq/L (ref 19–32)
Calcium: 8.4 mg/dL (ref 8.4–10.5)
Creatinine, Ser: 7.74 mg/dL — ABNORMAL HIGH (ref 0.50–1.35)
GFR calc non Af Amer: 7 mL/min — ABNORMAL LOW (ref >90)
GFR, EST AFRICAN AMERICAN: 9 mL/min — AB (ref >90)
Glucose, Bld: 92 mg/dL (ref 70–99)
POTASSIUM: 3.7 meq/L (ref 3.7–5.3)
Sodium: 148 mEq/L — ABNORMAL HIGH (ref 137–147)

## 2013-12-31 LAB — CBC
HEMATOCRIT: 24.7 % — AB (ref 39.0–52.0)
HEMOGLOBIN: 7.7 g/dL — AB (ref 13.0–17.0)
MCH: 31.6 pg (ref 26.0–34.0)
MCHC: 31.2 g/dL (ref 30.0–36.0)
MCV: 101.2 fL — ABNORMAL HIGH (ref 78.0–100.0)
Platelets: 117 10*3/uL — ABNORMAL LOW (ref 150–400)
RBC: 2.44 MIL/uL — AB (ref 4.22–5.81)
RDW: 14.7 % (ref 11.5–15.5)
WBC: 4.6 10*3/uL (ref 4.0–10.5)

## 2013-12-31 LAB — GLUCOSE, CAPILLARY
GLUCOSE-CAPILLARY: 135 mg/dL — AB (ref 70–99)
GLUCOSE-CAPILLARY: 84 mg/dL (ref 70–99)
GLUCOSE-CAPILLARY: 90 mg/dL (ref 70–99)
GLUCOSE-CAPILLARY: 98 mg/dL (ref 70–99)
Glucose-Capillary: 100 mg/dL — ABNORMAL HIGH (ref 70–99)
Glucose-Capillary: 140 mg/dL — ABNORMAL HIGH (ref 70–99)

## 2013-12-31 MED ORDER — NEPRO/CARBSTEADY PO LIQD
237.0000 mL | Freq: Three times a day (TID) | ORAL | Status: DC
  Administered 2013-12-31 – 2014-01-02 (×7): 237 mL

## 2013-12-31 MED ORDER — FREE WATER
400.0000 mL | Status: DC
  Administered 2013-12-31 – 2014-01-03 (×15): 400 mL

## 2013-12-31 NOTE — Progress Notes (Signed)
Speech Language Pathology Treatment: Dysphagia  Patient Details Name: [name] MRN: [id number] DOB: 10-Dec-1965 Today's Date: 12/31/2013 Time: 4696-2952 SLP Time Calculation (min): 16 min  Assessment / Plan / Recommendation Clinical Impression  RN reports pt has eaten very little today due to poor arousal for po's.  Currently pt is sleeping soundly, but awakens to cold wet cloth on face and to voice, however, he quickly returns to sleep if not continually stimulated.  Pt took 3 bites of applesauce, then began to cough.  SLP suctioned and retrieved small amount of applesauce from upper pharyngeal area (probable vallecular residue).  Pt is at high risk for aspiration if not awake.  If pt does not become more alert and responsive, nutrition by po only will remain poor.  ? Need for PEG if LOA does not improve, if this is desired by family (currently pt is a full code).  No family has been present during SLP sessions.   HPI HPI: Pt is a 48 year old male with a history of developmental disability, admitted with pyelonephritis, septic shock and ARF. Pt intubated on 8/14, extubated 8/19 and having BiPAP overnight. Pt has had recurrent seizures and marked spasticity and discomfort of lower extremities with spasms. MD reports dysphagia in note, ?history. NO history of seeing SLP in notes.    Pertinent Vitals Pain Assessment: No/denies pain Pain Score: Asleep Faces Pain Scale: Hurts worst Pain Location: spasms in all 4s with moaning and crying out, unable to discern exact location Pain Descriptors / Indicators: Spasm Pain Intervention(s): Monitored during session;Repositioned;Utilized relaxation techniques  SLP Plan  Goals updated    Recommendations Diet recommendations: Dysphagia 1 (puree);Nectar-thick liquid Liquids provided via: Teaspoon Medication Administration: Crushed with puree Supervision: Full supervision/cueing for compensatory strategies Compensations: Slow rate;Small  sips/bites;Check for anterior loss Postural Changes and/or Swallow Maneuvers: Seated upright 90 degrees              Oral Care Recommendations: Oral care Q4 per protocol Follow up Recommendations: Skilled Nursing facility;24 hour supervision/assistance Plan: Goals updated    GO     Maryjo Rochester T 12/31/2013, 5:31 PM

## 2013-12-31 NOTE — Progress Notes (Signed)
[place] TEAM 1 - Stepdown/ICU TEAM Progress Note  [name] YHC:[id number] DOB: 10/06/1965 DOA: 12/10/2013 PCP: Default, Provider, MD  Admit HPI / Brief Narrative: 48 y/o [facility] SNF resident with PMHx of developmental delay, DJD, O2 dependent COPD / Tobacco Abuse, OSA, Cervical / Lumbosacral spondylosis & recent admission to [facility] for back pain & falls who presented to [facility] on 12/12/2013 with fever and AMS. Reportedly he had a worsening mental status for 3-4 days prior to admission. Work up at [place] was remarkable for UTI, lactic acid 1.1, Na 140, K 5.1, AG 22, sr cr 8.95, Alk phos 106, CK 560, INR 1.2, WBC 9.6, Hgb 11.7. Given acute kidney injury, patient was transferred to [facility] for further care.   Upon arrival to [facility], the patient was assessed by [facility] and found to have concerns for posturing, inability to protect his airway, and shock and PCCM was emergently consulted for evaluation. Patient was intubated and HD catheter placed by PCCM.     Pt was treated aggressively for septic shock due to pyelonephritis. Pressors were eventually weaned and dc'd. He also required CRRT for AKI but unfortunately his renal function has not recovered and has required PRN dialysis since then. He had failed swallowing evaluations and required enteral feeds via tube.He finally was cleared for a dysphagia 1 diet. As a precaution TID bolus feeds also in place. He has also been very confused and has required restraints. He is not a good candidate for OP HD due to mentation and nonambulatory status in the setting of severe progressive functional decline. Palliative care has been consulted to discuss goals of care with the family.  Significant Events: 8/14 Admit to [facility] from [place], SNF resident with fever, AMS, hypotension. Intubated for airway protection. Vasopressors initiated  8/14 Neuro consult: Valproic acid initiated  8/14 Renal consult: CRRT initiated  8/15 Vasopressors weaned to  off  8/16 ?recurrent seizures >> VPA level low  8/18 Off CRRT  8/19 Extubated  8/20 Transfer to SDU 8/24 Palliative care consulted-family meeting planned for 8/26  HPI/Subjective: Still sleepy but did awaken and told us he was "scared" -said "OK" when informed would like to start getting him OOB  Assessment/Plan:  Acute respiratory failure 2nd to septic shock Resolved - extubated 8/19 - sepsis physiology has also reslved  Septic shock 2nd to pyelonephritis multiple organism in urine cx 8/14 - has completed 7 day course of Zosyn   AKI, anuric 2nd to pyelonephritis, septic shock, myelopathy and neurogenic bladder Nephrology following - no hydronephrosis from renal ultra sound - MRI noted distended bladder suggesting neurogenic bladder - foley cath - see Nephrology notes - poor candidate for long term HD-add free water per tube  Hypokalemia replete per tube gently - follow lytes  Thrombocytopenia HIT panel negative - plt count stabilizing - likely due to pyelo (gram negative infxn)  Spacticity/spasms of lower extremities Neurology following - records suggest pt was ambulating w/ a rolling walker as late as 11/28/2013 - pt had myelogram 7/30 for this issue, with study noting most severe issue being "moderate" spinal stenosis at C3-C4 - has been evaluated by a Neurosurgery earlier this month w/ full MRI of lumbar spine under anesthesia and it was not felt that surgical intervention was indicated - initiated baclofen and follow -OOB to chair TID and cont PT attempts  Acute Encephalopathy 2nd to seizures, sepsis, AKI Mental status slow to change/improve - seems to wax and wane with alertness  COPD  Well compensated at this time -no wheezing  Hx of OSA  Bradycardia Resolved  Dysphagia 8/24 passed eval and OK for D1 but unsure if can maintain adequate caloric intake so will cont enteric feeds via bolus per tube- not a great candidate for PEG  Anemia of critical illness -Hgb stable  around 8.5-9-baseline hgb ~11  Code Status: FULL Family Communication: no family present at time of exam-updated sister Delila Spence on 8/24 Disposition Plan: SDU - Palliative Care consult   Consultants: Neurology Nephrology  Antibiotics: none  DVT prophylaxis: SQ heparin   Objective: Blood pressure 121/65, pulse 93, temperature 98.4 F (36.9 C), temperature source Axillary, resp. rate 17, height 5' 2"  (1.575 m), weight 164 lb 3.9 oz (74.5 kg), SpO2 100.00%.  Intake/Output Summary (Last 24 hours) at 12/31/13 1050 Last data filed at 12/31/13 0736  Gross per 24 hour  Intake 433.83 ml  Output    700 ml  Net -266.17 ml   Exam: General: No acute respiratory distress - sleepy but will awaken and communicate limitedly Lungs: Clear to auscultation bilaterally without wheezes or crackles Cardiovascular: Regular rate and rhythm without murmur gallop or rub Abdomen: Nontender, nondistended, soft, bowel sounds positive, no rebound, no ascites, no appreciable mass Extremities: No significant cyanosis, clubbing, or edema bilateral lower extremities  Neuro: Speech mumbling/garbled when finally awakened - no focal CN deficits noted - keeps legs and arms in a flexed posture but easily extends but pt will purposefully return to flexed -- Babinski present B feet  Data Reviewed: Basic Metabolic Panel:  Recent Labs Lab 12/25/13 0400  12/27/13 0521 12/28/13 0530 12/29/13 0315 12/30/13 0313 12/31/13 0528  NA 142  < > 146 146 142 144 148*  K 4.1  < > 4.0 4.0 3.3* 3.0* 3.7  CL 104  < > 106 105 101 104 108  CO2 27  < > 24 21 28 27 26   GLUCOSE 106*  < > 72 72 98 90 92  BUN 31*  < > 70* 85* 39* 45* 56*  CREATININE 3.04*  < > 6.88* 8.42* 4.99* 6.42* 7.74*  CALCIUM 8.3*  < > 8.5 8.2* 8.3* 8.2* 8.4  PHOS 3.7  --  6.7*  --   --   --   --   < > = values in this interval not displayed.  Liver Function Tests:  Recent Labs Lab 12/25/13 0400 12/27/13 0521 12/28/13 0530 12/29/13 0315  12/30/13 0313  AST  --   --  24 24 25   ALT  --   --  35 35 34  ALKPHOS  --   --  71 70 68  BILITOT  --   --  <0.2* <0.2* <0.2*  PROT  --   --  5.0* 5.1* 5.2*  ALBUMIN 1.5* 1.6* 1.6* 1.7* 1.7*   CBC:  Recent Labs Lab 12/27/13 0500 12/28/13 0530 12/29/13 0312 12/30/13 0313 12/31/13 0528  WBC 5.1 5.6 4.8 4.9 4.6  HGB 8.6* 8.7* 9.1* 8.9* 7.7*  HCT 27.2* 26.0* 27.0* 26.6* 24.7*  MCV 103.8* 99.2 97.5 97.4 101.2*  PLT 114* 148* 142* 136* 117*   CBG:  Recent Labs Lab 12/30/13 1550 12/30/13 2026 12/31/13 0053 12/31/13 0344 12/31/13 0739  GLUCAP 124* 109* 98 84 90    No results found for this or any previous visit (from the past 240 hour(s)).   Studies:  Recent x-ray studies have been reviewed in detail by the Attending Physician  Scheduled Meds:  Scheduled Meds: . antiseptic oral rinse  7 mL Mouth Rinse QID  . baclofen  5 mg Per Tube TID  . chlorhexidine  15 mL Mouth Rinse BID  . feeding supplement (NEPRO CARB STEADY)  237 mL Per Tube TID PC  . feeding supplement (PRO-STAT SUGAR FREE 64)  30 mL Per Tube BID  . free water  400 mL Per Tube 6 times per day  . heparin subcutaneous  5,000 Units Subcutaneous 3 times per day  . sodium chloride  10-40 mL Intracatheter Q12H  . valproate sodium  750 mg Intravenous 3 times per day    Time spent on care of this patient: 35 mins   ELLIS,ALLISON L. , ANP   [facility] Office  [phone] Pager - Text Page per Shea Evans as per below:  On-Call/Text Page:      [email or web address]      password [email or web address]  If 7PM-7AM, please contact night-coverage [email or web address] Password [email or web address] 12/31/2013, 10:50 AM   LOS: 11 days   Examined patient and discussed assessment and plan with ANP Ebony Hail and agree with the above plan. Awaiting palliative care meeting and recommendations. Patient with multiple complex medical problems> 40 minutes spent in direct patient care

## 2013-12-31 NOTE — Progress Notes (Signed)
Physical Therapy Treatment Patient Details Name: [name] MRN: [id number] DOB: [birth year] Today's Date: 12/31/2013    History of Present Illness This 48 y.o. male admitted 8/14 from [facility] with fever and AMS.   Pt was unable to protect his airway and was intubated.   Dx: pyelonephritis; r/o aspiration PNA; encephalopathy.  Pt extubated 12/25/13.  PMH:  h/o developmental disability; COPD; sleep apnea.   Multiple ED visits/admits since 7/15 for falls.     PT Comments    Pt primarily limited by pain, apparently related to spasticity x 4 extremities (however pt unable to clearly indicate location of pain). He does moan and cry out with any mobility or ROM. Noted pt was ambulatory with RW at an Assisted Living in July 2015 and spasticity progressively caused his functional decline (per review of prior admissions, studies, and neurology notes his early presenting complaints were falls, pain and spasticity in his legs). For pt to currently make functional progress, feel his spasticity and pain need to be better controlled. Noted Palliative Care meeting for 8/26. Feel Physiatrist Consult for assist with pain and spasticity management could be an appropriate next step. Will continue to follow.    Follow Up Recommendations  SNF     Equipment Recommendations  None recommended by PT    Recommendations for Other Services       Precautions / Restrictions Precautions Precautions: Fall Precaution Comments: h/o mult falls at  ALF    Mobility  Bed Mobility Overal bed mobility: +2 for physical assistance;Needs Assistance Bed Mobility: Rolling Rolling: Total assist;+2 for physical assistance         General bed mobility comments: Pt rolling Rt and Lt for cleaning s/p very loose bowel movement.  (Any attempt to move himself (or passive movement) elicits spastic response x 4 extremities. LLE alternates between flexion vs extension pattern, RLE has flexion pattern. Attempted tone  reducing techniques with 1st toe flexion and abduction of toes with no apparent reduction. Spasms did calm with sidelying (Rt or Lt)  Transfers                 General transfer comment: unable due to severity of pain and spasms  Ambulation/Gait                 Stairs            Wheelchair Mobility    Modified Rankin (Stroke Patients Only)       Balance                                    Cognition Arousal/Alertness: Awake/alert Behavior During Therapy: Flat affect Overall Cognitive Status: Impaired/Different from baseline Area of Impairment: Attention;Following commands   Current Attention Level: Focused (? also distracted by pain)   Following Commands: Follows one step commands inconsistently;Follows one step commands with increased time       General Comments: Pt verbalizing one word answers to basic questions, however unable to describe or point to location of his pain. Follows some commands with UEs, however spasticity in LEs is stronger and not following commands    Exercises Other Exercises Other Exercises: See above notes re: positioning and attempts at ROM. RLE unable to fully extend due to flexion spasitcity (and ?incr pain as pt cries out): LLE appears to have full ROM hip and knee (in sagital plane), ankle to neutral DF.    General Comments  Pertinent Vitals/Pain Pain Assessment: Faces Faces Pain Scale: Hurts worst Pain Location: spasms in all 4s with moaning and crying out, unable to discern exact location Pain Descriptors / Indicators: Spasm Pain Intervention(s): Monitored during session;Repositioned;Utilized relaxation techniques    Home Living                      Prior Function            PT Goals (current goals can now be found in the care plan section) Acute Rehab PT Goals Patient Stated Goal: Pt unable Progress towards PT goals: Not progressing toward goals - comment    Frequency  Min  2X/week    PT Plan Current plan remains appropriate    Co-evaluation PT/OT/SLP Co-Evaluation/Treatment: Yes Reason for Co-Treatment: Complexity of the patient's impairments (multi-system involvement) PT goals addressed during session: Strengthening/ROM;Other (comment) (tone reduction with ROM)       End of Session   Activity Tolerance: Patient limited by pain;Treatment limited secondary to medical complications (Comment) (spasticity) Patient left: in bed;with call bell/phone within reach;Other (comment) (OT in room)     Time: 1914-7829 PT Time Calculation (min): 31 min  Charges:  $Therapeutic Exercise: 8-22 mins                    G Codes:      Daniell Mancinas 01/03/2014, 4:49 PM Pager [phone]

## 2013-12-31 NOTE — Progress Notes (Signed)
Subjective: Patient is experiencing less pain and lower extremities with movement and spasticity is less, particularly in his left leg. No recurrent seizure activity reported.  Objective: Current vital signs: BP 121/65  Pulse 93  Temp(Src) 98.4 F (36.9 C) (Axillary)  Resp 17  Ht  (1.575 m)  Wt 74.5 kg (164 lb 3.9 oz)  BMI 30.03 kg/m2  SpO2 100%  Neurologic Exam: Patient was lethargic but easy to arouse. He was in no acute distress. He followed commands well. Patient had marked improvement with essentially full range of motion of his left leg with knee flexion and extension. Movement was voluntary, albeit slow. There was moderately severe continued spasticity of right lower extremity with restricted range of motion right knee and slight pain with passive range of motion.  Medications: I have reviewed the patient's current medications.  Assessment/Plan: Spastic paraparesis with improvement and spasticity and range of motion and lower extremities, left more so than the right. Patient appears to be tolerating baclofen well without sedation beyond his usual lethargic state. He has not had a recurrent seizure.  Recommend no changes in current management with continuation of baclofen at 5 mg every 8 hours. Baclofen may be increased to 10 mg every 8 hours after 1 week, if patient is not having problems with sedation. No change in current dose of Depakote recommended for seizure control.  We will plan to see this patient in followup on an as-needed basis following today's visit. Please don't hesitate to call if reevaluation as needed.  C.R. Roseanne Reno, MD [facility] [phone]  12/31/2013  8:39 AM

## 2013-12-31 NOTE — Progress Notes (Signed)
S:Patient with mumbled speech but interactive.  Report he is feeling fine, no complaints. O:BP 121/65  Pulse 93  Temp(Src) 98.4 F (36.9 C) (Axillary)  Resp 17  Ht  (1.575 m)  Wt 164 lb 3.9 oz (74.5 kg)  BMI 30.03 kg/m2  SpO2 100%  Intake/Output Summary (Last 24 hours) at 12/31/13 0909 Last data filed at 12/31/13 0736  Gross per 24 hour  Intake 433.83 ml  Output    900 ml  Net -466.17 ml   Intake/Output: I/O last 3 completed shifts: In: 433.8 [I.V.:110; NG/GT:223.8; IV Piggyback:100] Out: 1300 [Urine:1300]  Intake/Output this shift:  Total I/O In: -  Out: 150 [Urine:150] Weight change:  ZOX:WRUEAVW in bed in NAD, NGT in place HEENT: dry mucus membranes CVS:RRR, no r/m/g Resp:CTAB UJW:JXBJ, NT, ND Ext:no edema, non-tender   Recent Labs Lab 12/25/13 0400 12/26/13 0420 12/27/13 0521 12/28/13 0530 12/29/13 0315 12/30/13 0313 12/31/13 0528  NA 142 139 146 146 142 144 148*  K 4.1 3.8 4.0 4.0 3.3* 3.0* 3.7  CL 104 103 106 105 101 104 108  CO2 GLUCOSE 106* 70 72 72 98 90 92  BUN 31* 53* 70* 85* 39* 45* 56*  CREATININE 3.04* 5.00* 6.88* 8.42* 4.99* 6.42* 7.74*  ALBUMIN 1.5*  --  1.6* 1.6* 1.7* 1.7*  --   CALCIUM 8.3* 8.4 8.5 8.2* 8.3* 8.2* 8.4  PHOS 3.7  --  6.7*  --   --   --   --   AST  --   --   --  --   ALT  --   --   --  35 35 34  --    Liver Function Tests:  Recent Labs Lab 12/28/13 0530 12/29/13 0315 12/30/13 0313  AST ALT 35 35 34  ALKPHOS 71 70 68  BILITOT <0.2* <0.2* <0.2*  PROT 5.0* 5.1* 5.2*  ALBUMIN 1.6* 1.7* 1.7*    CBC:  Recent Labs Lab 12/27/13 0500 12/28/13 0530 12/29/13 0312 12/30/13 0313 12/31/13 0528  WBC 5.1 5.6 4.8 4.9 4.6  HGB 8.6* 8.7* 9.1* 8.9* 7.7*  HCT 27.2* 26.0* 27.0* 26.6* 24.7*  MCV 103.8* 99.2 97.5 97.4 101.2*  PLT 114* 148* 142* 136* 117*   CBG:  Recent Labs Lab 12/30/13 1550 12/30/13 2026 12/31/13 0053 12/31/13 0344 12/31/13 0739  GLUCAP 124* 109* 98  84 90   Studies/Results: Dg Abd Portable 1v  12/30/2013   CLINICAL DATA:  Feeding tube placement.  EXAM: PORTABLE ABDOMEN - 1 VIEW  COMPARISON:  12/28/2013.  FINDINGS: The feeding tube tip is in the fundal region of the stomach. Stable bowel gas pattern. No definite free air.  IMPRESSION: The feeding tube tip is in the fundal region of the stomach.   Electronically Signed   By: Loralie Champagne M.D.   On: 12/30/2013 07:43   . antiseptic oral rinse  7 mL Mouth Rinse QID  . baclofen  5 mg Per Tube TID  . chlorhexidine  15 mL Mouth Rinse BID  . feeding supplement (NEPRO CARB STEADY)  237 mL Per Tube TID PC  . feeding supplement (PRO-STAT SUGAR FREE 64)  30 mL Per Tube BID  . heparin subcutaneous  5,000 Units Subcutaneous 3 times per day  . sodium chloride  10-40 mL Intracatheter Q12H  . valproate sodium  750 mg Intravenous 3 times per day    BMET    Component Value  Date/Time   NA 148* 12/31/2013 0528   K 3.7 12/31/2013 0528   CL 108 12/31/2013 0528   CO2 26 12/31/2013 0528   GLUCOSE 92 12/31/2013 0528   BUN 56* 12/31/2013 0528   CREATININE 7.74* 12/31/2013 0528   CALCIUM 8.4 12/31/2013 0528   GFRNONAA 7* 12/31/2013 0528   GFRAA 9* 12/31/2013 0528   CBC    Component Value Date/Time   WBC 4.6 12/31/2013 0528   RBC 2.44* 12/31/2013 0528   RBC 4.28 09/21/2012 0436   HGB 7.7* 12/31/2013 0528   HCT 24.7* 12/31/2013 0528   PLT 117* 12/31/2013 0528   MCV 101.2* 12/31/2013 0528   MCH 31.6 12/31/2013 0528   MCHC 31.2 12/31/2013 0528   RDW 14.7 12/31/2013 0528   LYMPHSABS 0.7 12/28/2013 0815   MONOABS 0.4 12/21/2013 0815   EOSABS 0.0 12/27/2013 0815   BASOSABS 0.0 12/09/2013 0815   Lab Results  Component Value Date   CREATININE 7.74* 12/31/2013   CREATININE 6.42* 12/30/2013   CREATININE 4.99* 12/29/2013   Assessment/Plan:  1. AKI -  SCr continues to trend up to 7.7 today however at a slower rate than previously. UOP at 750 however patient appears to not have adequate intake. Not a good candidate for  long term dialysis . No Uremic symptoms at this time, but has poor overall mental function.  Will add free water to feeds. palliative care to meet with family. 2. Hypernatremia:2.6L free water deficit, add free water to NGT feeds 3. anion gap metabolic acidosis -resolved 4. Sepsis 2/2 to pyelonephritis - completed abx 5. Thrombocytopenia - HIT negative 6. Malnutrition: NGT in place for feeding 7. Acute encephalopathy/seizures/ spasticity - Improved still with significant cognitive impairment. Not a Neurosx candidate, plan per neurology  Gust Rung, DO  [facility], PGY2

## 2013-12-31 NOTE — Plan of Care (Signed)
Problem: SLP Dysphagia Goals Goal: Patient will utilize recommended strategies Patient will utilize recommended strategies during swallow to increase swallowing safety with  Outcome: Not Progressing Pt remains too lethargic for adequate po intake.

## 2013-12-31 NOTE — Clinical Social Work Note (Addendum)
CSW spoke with admissions director at [facility].  [facility] is still willing to take patient back at discharge.  PASAR renewal process begun 12/31/13.  Merlyn Lot, LCSWA Clinical Social Worker [phone]

## 2013-12-31 NOTE — Progress Notes (Addendum)
Occupational Therapy Treatment Patient Details Name: [name] MRN: [id number] DOB: 1966/01/31 Today's Date: 12/31/2013    History of present illness This 48 y.o. male admitted 8/14 from [facility] with fever and AMS.   Pt was unable to protect his airway and was intubated.   Dx: pyelonephritis; r/o aspiration PNA; encephalopathy.  Pt extubated 12/25/13.  PMH:  h/o developmental disability; COPD; sleep apnea.   Multiple ED visits/admits since 7/15 for falls.    OT comments  Pt more alert today.  Will follow some one step commands with prompting (~20% of time).  He will at times, answer some basic questions.   Planned to move pt to EOB, however, he was incontinent of stool.  He was assisted with peri care and exhibited severe spasms bil. LEs, trunk rigidity (likely spasms), and bil. UE spasms with pt crying out in pain.  States "It's miserable"   Do to the severity of spasms and pain, did not attempt to move pt to EOB.  Positioned him in sidelying, which was a position of comfort with pt stating "I'm good now".   He extends digits involuntarily with spasming.  He was able to flex digits Lt. Hand with moderate prompting, but would not/could not extend digits on command.  Per OT notes this pt was able to use bil. UEs to assist with BADLs on 12/09/13, and was able to feed self on 12/03/13 with set up assistance.  This is a significant decline since that time, appears to have new or increased neurological impairment.  Will continue attempts to work with pt.  Spasms, however, severely limit his ability to do so   Follow Up Recommendations  SNF    Equipment Recommendations  None recommended by OT    Recommendations for Other Services      Precautions / Restrictions Precautions Precautions: Fall Precaution Comments: h/o mult falls at  ALF       Mobility Bed Mobility Overal bed mobility: +2 for physical assistance;Needs Assistance Bed Mobility: Rolling Rolling: Total assist          General bed mobility comments: Pt requires total A for all aspects.  He will attempt to reach across with UE toward rail, but unable to reach rail.  Pt with flexor/extensor spasms Lt. LE and flexor spasms Rt. LE.  Trunk with rigidity - likely spasms and noted spasms bil. UEs  Transfers                 General transfer comment: unable due to severity of pain and spasms    Balance                                   ADL Overall ADL's : Needs assistance/impaired Eating/Feeding: NPO   Grooming: Total assistance Grooming Details (indicate cue type and reason): Pt with bil. UE spasms.  Can override them at times with AROM, but unable to engage UEs in functional activities this date Upper Body Bathing: Total assistance;Bed level   Lower Body Bathing: Total assistance;Bed level   Upper Body Dressing : Total assistance;Bed level   Lower Body Dressing: Total assistance;Bed level   Toilet Transfer: Total assistance Toilet Transfer Details (indicate cue type and reason): Unable  Toileting- Clothing Manipulation and Hygiene: Total assistance;Bed level Toileting - Clothing Manipulation Details (indicate cue type and reason): Pt incontinent of bowel. Assisted with clean up.  Stool loose and liquid with significant redness of  scrotum and buttocks noted.  Barrier cream, and RN notified     Functional mobility during ADLs: Total assistance General ADL Comments: Attempted to move pt to EOB, however, pt incontinent of stool.  Assisted him with clean up.  Pt with severe spasms bil. LEs, trunk and UEs.  Pt crying out in pain.  Unable to move pt to EOB due to spasms and pain.  Positioned pt in Lt. sidelying which, per pt, was a comfortable position       Vision                     Perception     Praxis      Cognition   Behavior During Therapy: Flat affect Overall Cognitive Status: Impaired/Different from baseline Area of Impairment: Attention                 General Comments: Pt lethargic, but kept eyes open for most of session.   He will follow one step motor commands with mod - max cues inconsistently (~20% of time); He will answer simple questions when prompted.  He indicates signficant pain with all attempts at mobility and when asked if it hurts, he cries out "it's miserable"    Extremity/Trunk Assessment               Exercises Other Exercises Other Exercises: Fingers extend with spasms.  He was able to actively flex digits Lt hand, but would not extend them on command.  He assisted with shoulder flexion and elbow flexion Lt. UE; and attempted to horizontally aduct Rt. UE to reach for rail.   No other active movement noted bil. UEs today   Shoulder Instructions       General Comments      Pertinent Vitals/ Pain       Pain Assessment: 0-10 Pain Score: 10-Worst pain ever Pain Location: bil. UE and LE spasms - unable to discern exact location of pain  Pain Descriptors / Indicators: Spasm Pain Intervention(s): Monitored during session;Repositioned  Home Living                                          Prior Functioning/Environment              Frequency Min 2X/week     Progress Toward Goals  OT Goals(current goals can now be found in the care plan section)  Progress towards OT goals: Not progressing toward goals - comment (severity of spasms)  ADL Goals Pt Will Perform Eating: with mod assist;with adaptive utensils;sitting;bed level (goal on hold due to NPO and dysphagia status) Pt Will Perform Grooming: with mod assist;with adaptive equipment;sitting;bed level Pt Will Perform Upper Body Bathing: with set-up;sitting Pt Will Perform Upper Body Dressing: with supervision Pt Will Perform Lower Body Dressing: with min assist Pt Will Transfer to Toilet: with mod assist;with +2 assist;bedside commode Pt/caregiver will Perform Home Exercise Program: Both right and left upper extremity;Increased  strength Additional ADL Goal #1: Pt will perform rolling with mod A in prep for BADLs Additional ADL Goal #2: Pt will sit EOB with min A in prep for BADLs and functional transfers.   Plan Discharge plan remains appropriate    Co-evaluation    PT/OT/SLP Co-Evaluation/Treatment: Yes Reason for Co-Treatment: Complexity of the patient's impairments (multi-system involvement)   OT goals addressed during session: ADL's and self-care  End of Session     Activity Tolerance Patient limited by pain   Patient Left in bed;with call bell/phone within reach   Nurse Communication Mobility status (redness buttocks and scrotum )        Time: 4098-1191 OT Time Calculation (min): 30 min  Charges: OT General Charges $OT Visit: 1 Procedure OT Treatments $Therapeutic Activity: 8-22 mins  Earlene Bjelland M 12/31/2013, 12:23 PM

## 2013-12-31 NOTE — Progress Notes (Signed)
Patient [name]      DOB: 29-Apr-1966      WUJ:[id number]  Left two messages for patient's sister yesterday. No return call over night . Will reattempt contact this am.  Markos Theil L. Ladona Ridgel, MD MBA The Palliative Medicine Team at [facility] Phone: [phone] Pager: [phone] ( Use team phone after hours)

## 2013-12-31 NOTE — Progress Notes (Signed)
I have personally seen and examined this patient and agree with the assessment/plan as outlined above by Mikey Bussing DO (PGY2). Renal function worse with marginal UOP, will start free water via NGT. No acute HD needs noted today- will continue daily monitoring for emerging needs. Not a candidate for long-term dialysis given his physical/mental limitations  Haeli Gerlich K.,MD 12/31/2013 9:51 AM

## 2013-12-31 NOTE — Progress Notes (Signed)
Patient [name]      DOB: [birth year]      WUJ:[id number]  Able to reach patient's sister.  Offered phone and in person goals of care.  [name] is unable to meet until tomorrow 8/26  at 530 pm due to her work and family issues.  Will put her on the schedule with Dr. Phillips Odor at that time.   Joshua Heumann L. Ladona Ridgel, MD MBA The Palliative Medicine Team at [facility] Phone: [phone] Pager: [phone] ( Use team phone after hours)

## 2014-01-01 ENCOUNTER — Inpatient Hospital Stay (HOSPITAL_COMMUNITY): Payer: Medicaid Other

## 2014-01-01 DIAGNOSIS — D649 Anemia, unspecified: Secondary | ICD-10-CM | POA: Diagnosis present

## 2014-01-01 DIAGNOSIS — D539 Nutritional anemia, unspecified: Secondary | ICD-10-CM | POA: Diagnosis present

## 2014-01-01 DIAGNOSIS — D696 Thrombocytopenia, unspecified: Secondary | ICD-10-CM | POA: Diagnosis present

## 2014-01-01 DIAGNOSIS — R6520 Severe sepsis without septic shock: Secondary | ICD-10-CM

## 2014-01-01 DIAGNOSIS — A419 Sepsis, unspecified organism: Secondary | ICD-10-CM

## 2014-01-01 DIAGNOSIS — E538 Deficiency of other specified B group vitamins: Secondary | ICD-10-CM | POA: Diagnosis present

## 2014-01-01 DIAGNOSIS — N179 Acute kidney failure, unspecified: Secondary | ICD-10-CM | POA: Diagnosis present

## 2014-01-01 DIAGNOSIS — R6521 Severe sepsis with septic shock: Secondary | ICD-10-CM

## 2014-01-01 LAB — CBC
HCT: 24.5 % — ABNORMAL LOW (ref 39.0–52.0)
HEMOGLOBIN: 7.8 g/dL — AB (ref 13.0–17.0)
MCH: 32 pg (ref 26.0–34.0)
MCHC: 31.8 g/dL (ref 30.0–36.0)
MCV: 100.4 fL — ABNORMAL HIGH (ref 78.0–100.0)
PLATELETS: 114 10*3/uL — AB (ref 150–400)
RBC: 2.44 MIL/uL — AB (ref 4.22–5.81)
RDW: 14.8 % (ref 11.5–15.5)
WBC: 5.3 10*3/uL (ref 4.0–10.5)

## 2014-01-01 LAB — BASIC METABOLIC PANEL
ANION GAP: 14 (ref 5–15)
BUN: 72 mg/dL — ABNORMAL HIGH (ref 6–23)
CHLORIDE: 109 meq/L (ref 96–112)
CO2: 26 mEq/L (ref 19–32)
Calcium: 8.6 mg/dL (ref 8.4–10.5)
Creatinine, Ser: 8.41 mg/dL — ABNORMAL HIGH (ref 0.50–1.35)
GFR calc non Af Amer: 7 mL/min — ABNORMAL LOW (ref >90)
GFR, EST AFRICAN AMERICAN: 8 mL/min — AB (ref >90)
Glucose, Bld: 99 mg/dL (ref 70–99)
POTASSIUM: 3.7 meq/L (ref 3.7–5.3)
SODIUM: 149 meq/L — AB (ref 137–147)

## 2014-01-01 LAB — GLUCOSE, CAPILLARY
GLUCOSE-CAPILLARY: 101 mg/dL — AB (ref 70–99)
GLUCOSE-CAPILLARY: 139 mg/dL — AB (ref 70–99)
GLUCOSE-CAPILLARY: 83 mg/dL (ref 70–99)
GLUCOSE-CAPILLARY: 85 mg/dL (ref 70–99)
Glucose-Capillary: 87 mg/dL (ref 70–99)
Glucose-Capillary: 126 mg/dL — ABNORMAL HIGH (ref 70–99)

## 2014-01-01 LAB — VALPROIC ACID LEVEL: Valproic Acid Lvl: 82.4 ug/mL (ref 50.0–100.0)

## 2014-01-01 MED ORDER — HEPARIN SODIUM (PORCINE) 1000 UNIT/ML DIALYSIS
3000.0000 [IU] | INTRAMUSCULAR | Status: DC | PRN
  Filled 2014-01-01: qty 3

## 2014-01-01 MED ORDER — LIDOCAINE HCL (PF) 1 % IJ SOLN: 5.0000 mL | INTRAMUSCULAR | Status: DC | PRN

## 2014-01-01 MED ORDER — BACLOFEN 5 MG HALF TABLET
5.0000 mg | ORAL_TABLET | Freq: Every day | ORAL | Status: DC
  Administered 2014-01-02 – 2014-01-03 (×2): 5 mg
  Filled 2014-01-01 (×3): qty 1

## 2014-01-01 MED ORDER — SODIUM CHLORIDE 0.9 % IV SOLN: 100.0000 mL | INTRAVENOUS | Status: DC | PRN

## 2014-01-01 MED ORDER — HYDROMORPHONE HCL 2 MG PO TABS
2.0000 mg | ORAL_TABLET | Freq: Three times a day (TID) | ORAL | Status: DC
  Administered 2014-01-01 – 2014-01-02 (×3): 2 mg
  Filled 2014-01-01 (×3): qty 1

## 2014-01-01 MED ORDER — LIDOCAINE-PRILOCAINE 2.5-2.5 % EX CREA
1.0000 "application " | TOPICAL_CREAM | CUTANEOUS | Status: DC | PRN
  Filled 2014-01-01: qty 5

## 2014-01-01 MED ORDER — HEPARIN SODIUM (PORCINE) 1000 UNIT/ML DIALYSIS
1000.0000 [IU] | INTRAMUSCULAR | Status: DC | PRN
  Administered 2014-01-01: 1000 [IU] via INTRAVENOUS_CENTRAL
  Filled 2014-01-01: qty 1

## 2014-01-01 MED ORDER — NEPRO/CARBSTEADY PO LIQD: 237.0000 mL | ORAL | Status: DC | PRN

## 2014-01-01 MED ORDER — VANCOMYCIN HCL IN DEXTROSE 1-5 GM/200ML-% IV SOLN
1000.0000 mg | Freq: Once | INTRAVENOUS | Status: AC
  Administered 2014-01-01: 1000 mg via INTRAVENOUS
  Filled 2014-01-01: qty 200

## 2014-01-01 MED ORDER — DIAZEPAM 2 MG PO TABS: 2.0000 mg | ORAL_TABLET | Freq: Every day | ORAL | Status: DC

## 2014-01-01 MED ORDER — HYDROMORPHONE HCL PF 1 MG/ML IJ SOLN: 0.5000 mg | INTRAMUSCULAR | Status: DC | PRN

## 2014-01-01 MED ORDER — PENTAFLUOROPROP-TETRAFLUOROETH EX AERO: 1.0000 "application " | INHALATION_SPRAY | CUTANEOUS | Status: DC | PRN

## 2014-01-01 MED ORDER — DIAZEPAM 5 MG PO TABS
5.0000 mg | ORAL_TABLET | Freq: Every day | ORAL | Status: DC
  Administered 2014-01-01: 5 mg via ORAL
  Filled 2014-01-01: qty 1

## 2014-01-01 MED ORDER — VALPROIC ACID 250 MG/5ML PO SYRP
750.0000 mg | ORAL_SOLUTION | Freq: Three times a day (TID) | ORAL | Status: DC
  Administered 2014-01-01 – 2014-01-03 (×5): 750 mg
  Filled 2014-01-01 (×8): qty 15

## 2014-01-01 MED ORDER — SENNOSIDES-DOCUSATE SODIUM 8.6-50 MG PO TABS
1.0000 | ORAL_TABLET | Freq: Every day | ORAL | Status: DC
  Administered 2014-01-01 – 2014-01-02 (×2): 1
  Filled 2014-01-01 (×4): qty 1

## 2014-01-01 MED ORDER — DIAZEPAM 5 MG/ML IJ SOLN: 2.5000 mg | INTRAMUSCULAR | Status: DC | PRN

## 2014-01-01 MED ORDER — ACETAMINOPHEN 500 MG PO TABS
1000.0000 mg | ORAL_TABLET | Freq: Three times a day (TID) | ORAL | Status: DC
  Administered 2014-01-01 – 2014-01-03 (×5): 1000 mg
  Filled 2014-01-01 (×8): qty 2

## 2014-01-01 MED ORDER — ALTEPLASE 2 MG IJ SOLR
2.0000 mg | Freq: Once | INTRAMUSCULAR | Status: AC | PRN
  Filled 2014-01-01: qty 2

## 2014-01-01 NOTE — Consult Note (Signed)
Palliative Medicine Team Consult Note Requested by: [facility]  HPI/Interval History: [name] "[name] is a 48 yo man who was admitted to [facility] on 12/25/2013 from [facility] with septic shock from pyelonephritis with acute renal failure. I have reviewed his chart in detail and met with his sister (primary caregiver and only NOK). [name] is not severely developmentally delayed, he finished high school, maintained employment in restaurants, and lived with his parents most of his life. His father died 53 years ago and his mother died one year ago at which time he continued to live independently with minimal assistance from his sister who regularly checked in on him. His sister reports that he lived independently because he smoked all day long -5 or more packs of cigarettes a day and that it was hard for her to visit and he couldn't live with her family because of this habit. Per her report [name] has always had back problems and complained of leg cramps and pain for several months. In July of this year 2015 he started needing a walker to ambulate safely because he would get severe leg cramps and fall to the floor with pain - he also walked with a hunched gait. Because of this, he went to [facility] (he liked it there because he had social interaction) but continued to fall there and had serious disease progression of LE spasticity, HA, severe pain/cramping in his legs- this was all fairly acute in onset. His mental status is also dramatically different- on his neuro-eval by [facility] less than a month ago he was AOX4 and had good insight into his condition. He has never been "institutionalized"-except for one week at [facility] for rehab after having LE cellulitis. He has not had w/u for PAD/PVD or claudication. Imaging limited in this setting due to his renal failure. MRI also abnormal periventricular white matter Foci which may explain symptoms- limited wo contrast. Additionally his metabolic issues with ARF  complicate his presentation. Family report that his current mental status is "blank", "very lethargic"-"confused and flat". Prior to this hospitalization he was social, talkative and appropriate.  Meeting Details: Met with his sister Lenna Sciara, and her children(patient's niece and nephew)  Summary of Discussion:  1. DNR  2. Melissa asked very appropriate questions- "how did it get this bad?". She understands that he has a very poor prognosis and that he is critically ill and will likely never recover-but she doesn't feel like she has a good understanding of exactly what he is dying of in terms of the big picture.(I agree with this-he is 48 yo and we don't really have a clear diagnosis for his acute spinal radiculopathy and intractable spasms and pain-I question a dx of MS? 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 I discussed that he would not be a candidate for outpatient HD in his current condition and that his kidneys are not recovering- this will likely be a terminal condition for him-at which point I recommended a Hospice facility- she agrees if no improvement that this would be an appropriate next step.  4. Family have no desire to keep [name] alive in his current condition or to prolong  his suffering if he cannot get better- he told them he never wanted to live in a SNF or be burden, but he never talked about dying or his wishes in teh event of serious illness.  5. They desire pain management and comfort  long with continued diagnostics and reasonable medical interventions until those prove to be ineffective.  Symptom Management recommendations:  1.  Uncontrolled pain- clearly severe with moaning diaphoresis and agitation, no PRNs, goals are comfort along with reasonable treatment- should not hold back pain meds for fear of respiratory compromise.   Scheduled Tylenol TID  Scheduled low dose Hydromorphone per tube and IV for breakthrough pain  QHS Valium- best for spasms and agitation  Valium IV prn for severe agitation  Cut back baclofen dose to 64m daily given renal failure and sedated affect  Would strongly consider a fairly high dose of steroids for inflammatory pain and radiculitis-will defer to primary team and neurology.  Will continue to work with patient and his family on goals and pain management.  ELane Hacker DO Palliative Medicine [phone]

## 2014-01-01 NOTE — Procedures (Signed)
Hemodialysis Catheter Insertion Procedure Note [name] [id number] 12/17/1965  Procedure: Insertion of Hemodialysis Catheter Indications: Dialysis Access - catheter had almost completely fell out but there was still blood return   Procedure Details Consent: Unable to obtain consent because of altered level of consciousness. Time Out: Verified patient identification, verified procedure, site/side was marked, verified correct patient position, special equipment/implants available, medications/allergies/relevent history reviewed, required imaging and test results available.  Performed  Maximum sterile technique was used including antiseptics, gloves, hand hygiene and sheet. Skin prep: Chlorhexidine in copious amounts around the insertion site of foliate L.; local anesthetic administered. I doubled draped the area of interest then inserted a guidewire through the third port and  pulled the catheter completely out confirming that it was intact. I then removed the superficial layer of sterile towels to ensure that the wire was lying on a sterile towel that was not contaminated. I then re cleaned the skin overlying the venotomy site with chlorhexidine again. Finally I inserted the new catheter over the guidewire and sutured hubs in place. I confirmed excellent return and flushed through all 3 ports and then filled them with copious amounts of saline. I placed the caps and did not fill with heparin given that the patient was going to be hooked up on dialysis immediately. Hemostasis was easily obtained.    Evaluation Complications: No apparent complications Patient did tolerate procedure well.    Ethelene Hal, MD 01/01/2014

## 2014-01-01 NOTE — Progress Notes (Signed)
Pt lethargic and not responding to questions upon arrival to HD. Wakes up and moans loudly, but then goes back to resting/sleep. MD aware.

## 2014-01-01 NOTE — Progress Notes (Signed)
[facility] TEAM 1 - Stepdown/ICU TEAM Progress Note  [name]:[id number] DOB: 12/15/1965 DOA: 12/12/2013 PCP: Default, Provider, MD  Admit HPI / Brief Narrative: 48 y/o M SNF resident with Hx of developmental delay, DJD, O2 dependent COPD / Tobacco Abuse, OSA, Cervical / Lumbosacral spondylosis & recent admission to [facility] for back pain & falls who presented to [facility] on 12/15/2013 with fever and AMS. Reportedly he had a worsening mental status for 3-4 days prior to admission. Work up at [place] was remarkable for UTI, lactic acid 1.1, Na 140, K 5.1, AG 22, sr cr 8.95, Alk phos 106, CK 560, INR 1.2, WBC 9.6, Hgb 11.7. Given acute kidney injury, patient was transferred to [facility] for further care.   Upon arrival to [facility], the patient was assessed by [facility].  The MD had concerns for posturing, inability to protect his airway, and shock and PCCM was emergently consulted for evaluation. Patient was intubated and HD catheter placed by PCCM.     Pt was treated aggressively for septic shock due to pyelonephritis. Pressors were eventually weaned and dc'd. He also required CRRT for AKI but unfortunately his renal function has not recovered and he has required PRN dialysis since then. He had failed swallowing evaluations and required feeds via tube. He finally was cleared for a dysphagia 1 diet. As a precaution TID bolus feeds also in place. He has also been very confused and has required restraints. He is not a good candidate for OP HD due to mentation and nonambulatory status in the setting of severe progressive functional decline. Palliative Care has been consulted to discuss goals of care with the family.  Pt finally did participate enough with SLP to qualify for dysphagia 1 diet but has not been alert enough to maintain adequate caloric intake therefore bolus tube feedings initiated.  Significant Events: 8/14 Admit to [facility] from [place], SNF resident with fever, AMS,  hypotension. Intubated for airway protection. Vasopressors initiated  8/14 Neuro consult: Valproic acid initiated  8/14 Renal consult: CRRT initiated  8/15 Vasopressors weaned to off  8/16 ?recurrent seizures >> VPA level low  8/18 Off CRRT  8/19 Extubated  8/20 Transfer to SDU 8/24 Palliative care consulted-family meeting planned for 8/26  HPI/Subjective: More alert but speech is repetitive and non sensical-not following commands  Assessment/Plan:  Acute respiratory failure 2nd to septic shock Resolved - extubated 8/19 - sepsis physiology has also reslved  Septic shock 2nd to pyelonephritis multiple organism in urine cx 8/14 - has completed 7 day course of Zosyn   AKI, anuric 2nd to pyelonephritis, septic shock, myelopathy and neurogenic bladder Nephrology following - no hydronephrosis from renal ultra sound - MRI noted distended bladder suggesting neurogenic bladder - foley cath - see Nephrology notes - per Nephrologist he is not capable of undergoing outpt HD and current HD has not added much to his overall recovery  Hypernatremia Cont free water per tube  Hypokalemia replete per tube gently - follow lytes  Thrombocytopenia HIT panel negative - plt count remains low but > 100,000 - likely due to pyelo (gram negative infxn)  Spacticity/spasms of lower extremities Neurology following - records suggest pt was ambulating w/ a rolling walker as late as 11/28/2013 - pt had myelogram 7/30 for this issue, with study noting most severe issue being "moderate" spinal stenosis at C3-C4 - has been evaluated by a Neurosurgery earlier this month w/ full MRI of lumbar spine under anesthesia and it was not  felt that surgical intervention was indicated - initiated baclofen and follow -OOB to chair TID and cont PT attempts  Acute Encephalopathy 2nd to seizures, sepsis, AKI Mental status slow to change/improve - seems to wax and wane with alertness - have dc'd any sedating meds as of 8/26 and will  ck a Valproic acid level  COPD Well compensated at this time -no wheezing  Hx of OSA  Bradycardia Resolved  Dysphagia 8/24 passed eval and OK for D1 but due to ongoing lethargy and difficulty following commands he has been unable maintain adequate caloric intake so will cont enteric feeds via bolus per tube - not a great candidate for PEG  Anemia of critical illness -Hgb stable around 8.5-9 - baseline hgb ~11  Code Status: FULL Family Communication: no family present at time of exam-updated sister Delila Spence on 8/24 Disposition Plan: SDU - Palliative Care consulted and family mtg pending  Consultants: Neurology Nephrology  Antibiotics: none  DVT prophylaxis: SQ heparin   Objective: Blood pressure 128/53, pulse 97, temperature 98.6 F (37 C), temperature source Axillary, resp. rate 25, height _0  (1.575 m), weight 164 lb 3.9 oz (74.5 kg), SpO2 100.00%.  Intake/Output Summary (Last 24 hours) at 01/01/14 1340 Last data filed at 01/01/14 1125  Gross per 24 hour  Intake   1400 ml  Output   1325 ml  Net     75 ml   Exam: General: No acute respiratory distress - sleepy  Lungs: Clear to auscultation bilaterally without wheezes or crackles Cardiovascular: Regular rate and rhythm without murmur gallop or rub Abdomen: Nontender, nondistended, soft, bowel sounds positive, no rebound, no ascites, no appreciable mass Extremities: No significant cyanosis, clubbing, or edema bilateral lower extremities  Neuro: Speech mumbling/garbled and repetitive- no focal CN deficits noted - keeps legs and arms in a flexed posture but - extemities extend w/ examiner effort, but pt will purposefully return to flexed - Babinski present B feet - does not [facility]  Data Reviewed: Basic Metabolic Panel:  Recent Labs Lab 12/27/13 0521 12/28/13 0530 12/29/13 0315 12/30/13 0313 12/31/13 0528 01/01/14 0351  NA 146 146 142 144 148* 149*  K 4.0 4.0 3.3* 3.0* 3.7 3.7  CL 106 105 101 104 108 109    CO2 _1 GLUCOSE 72 72 98 90 92 99  BUN 70* 85* 39* 45* 56* 72*  CREATININE 6.88* 8.42* 4.99* 6.42* 7.74* 8.41*  CALCIUM 8.5 8.2* 8.3* 8.2* 8.4 8.6  PHOS 6.7*  --   --   --   --   --     Liver Function Tests:  Recent Labs Lab 12/27/13 0521 12/28/13 0530 12/29/13 0315 12/30/13 0313  AST  --  _2 ALT  --  35 35 34  ALKPHOS  --  71 70 68  BILITOT  --  <0.2* <0.2* <0.2*  PROT  --  5.0* 5.1* 5.2*  ALBUMIN 1.6* 1.6* 1.7* 1.7*   CBC:  Recent Labs Lab 12/28/13 0530 12/29/13 0312 12/30/13 0313 12/31/13 0528 01/01/14 0351  WBC 5.6 4.8 4.9 4.6 5.3  HGB 8.7* 9.1* 8.9* 7.7* 7.8*  HCT 26.0* 27.0* 26.6* 24.7* 24.5*  MCV 99.2 97.5 97.4 101.2* 100.4*  PLT 148* 142* 136* 117* 114*   CBG:  Recent Labs Lab 12/31/13 2124 12/31/13 2338 01/01/14 0412 01/01/14 0739 01/01/14 1132  GLUCAP 140* 139* 87 83 126*    Studies:  Recent x-ray studies have been reviewed in detail by the  Attending Physician  Scheduled Meds:  Scheduled Meds: . antiseptic oral rinse  7 mL Mouth Rinse QID  . baclofen  5 mg Per Tube TID  . chlorhexidine  15 mL Mouth Rinse BID  . feeding supplement (NEPRO CARB STEADY)  237 mL Per Tube TID PC  . feeding supplement (PRO-STAT SUGAR FREE 64)  30 mL Per Tube BID  . free water  400 mL Per Tube 6 times per day  . heparin subcutaneous  5,000 Units Subcutaneous 3 times per day  . sodium chloride  10-40 mL Intracatheter Q12H  . valproate sodium  750 mg Intravenous 3 times per day    Time spent on care of this patient: 35 mins   ELLIS,ALLISON L. , ANP   [facility] Office  [phone] Pager - Text Page per Shea Evans as per below:  On-Call/Text Page:      [email or web address]      password [email or web address]  If 7PM-7AM, please contact night-coverage [email or web address] Password [email or web address] 01/01/2014, 1:40 PM   LOS: 12 days   I have personally examined this patient and reviewed the entire database. I have reviewed the above note, made any necessary editorial  changes, and agree with its content.  Cherene Altes, MD [facility]

## 2014-01-01 NOTE — Progress Notes (Signed)
S:Patient reports he is "fine."  Difficult to understand his speech, he does follow commands. O:BP 107/53  Pulse 89  Temp(Src) 97.4 F (36.3 C) (Axillary)  Resp 12  Ht  (1.575 m)  Wt 164 lb 3.9 oz (74.5 kg)  BMI 30.03 kg/m2  SpO2 100%  Intake/Output Summary (Last 24 hours) at 01/01/14 0915 Last data filed at 01/01/14 0742  Gross per 24 hour  Intake   1400 ml  Output   1275 ml  Net    125 ml   Intake/Output: I/O last 3 completed shifts: In: 1833.8 [I.V.:210; NG/GT:1423.8; IV Piggyback:200] Out: 1325 [Urine:1325]  Intake/Output this shift:  Total I/O In: -  Out: 450 [Urine:450] Weight change: 0 lb (0 kg) WUJ:WJXBJYN in bed in NAD, NGT in place HEENT: dry mucus membranes CVS:RRR, no r/m/g Resp:CTAB WGN:FAOZ, NT, ND  HYQ:MVHQI edema, non-tender, moves all 4 extremities   Recent Labs Lab 12/26/13 0420 12/27/13 0521 12/28/13 0530 12/29/13 0315 12/30/13 0313 12/31/13 0528 01/01/14 0351  NA 139 146 146 142 144 148* 149*  K 3.8 4.0 4.0 3.3* 3.0* 3.7 3.7  CL 103 106 105 101 104 108 109  CO2 GLUCOSE 70 72 72 98 90 92 99  BUN 53* 70* 85* 39* 45* 56* 72*  CREATININE 5.00* 6.88* 8.42* 4.99* 6.42* 7.74* 8.41*  ALBUMIN  --  1.6* 1.6* 1.7* 1.7*  --   --   CALCIUM 8.4 8.5 8.2* 8.3* 8.2* 8.4 8.6  PHOS  --  6.7*  --   --   --   --   --   AST  --   --  --   --   ALT  --   --  35 35 34  --   --    Liver Function Tests:  Recent Labs Lab 12/28/13 0530 12/29/13 0315 12/30/13 0313  AST ALT 35 35 34  ALKPHOS 71 70 68  BILITOT <0.2* <0.2* <0.2*  PROT 5.0* 5.1* 5.2*  ALBUMIN 1.6* 1.7* 1.7*    CBC:  Recent Labs Lab 12/28/13 0530 12/29/13 0312 12/30/13 0313 12/31/13 0528 01/01/14 0351  WBC 5.6 4.8 4.9 4.6 5.3  HGB 8.7* 9.1* 8.9* 7.7* 7.8*  HCT 26.0* 27.0* 26.6* 24.7* 24.5*  MCV 99.2 97.5 97.4 101.2* 100.4*  PLT 148* 142* 136* 117* 114*   CBG:  Recent Labs Lab 12/31/13 1536 12/31/13 2124 12/31/13 2338  01/01/14 0412 01/01/14 0739  GLUCAP 135* 140* 139* 87 83   Studies/Results: No results found. Marland Kitchen antiseptic oral rinse  7 mL Mouth Rinse QID  . baclofen  5 mg Per Tube TID  . chlorhexidine  15 mL Mouth Rinse BID  . feeding supplement (NEPRO CARB STEADY)  237 mL Per Tube TID PC  . feeding supplement (PRO-STAT SUGAR FREE 64)  30 mL Per Tube BID  . free water  400 mL Per Tube 6 times per day  . heparin subcutaneous  5,000 Units Subcutaneous 3 times per day  . sodium chloride  10-40 mL Intracatheter Q12H  . valproate sodium  750 mg Intravenous 3 times per day    BMET    Component Value Date/Time   NA 149* 01/01/2014 0351   K 3.7 01/01/2014 0351   CL 109 01/01/2014 0351   CO2 26 01/01/2014 0351   GLUCOSE 99 01/01/2014 0351   BUN 72* 01/01/2014 0351   CREATININE 8.41* 01/01/2014 0351   CALCIUM  8.6 01/01/2014 0351   GFRNONAA 7* 01/01/2014 0351   GFRAA 8* 01/01/2014 0351   CBC    Component Value Date/Time   WBC 5.3 01/01/2014 0351   RBC 2.44* 01/01/2014 0351   RBC 4.28 09/21/2012 0436   HGB 7.8* 01/01/2014 0351   HCT 24.5* 01/01/2014 0351   PLT 114* 01/01/2014 0351   MCV 100.4* 01/01/2014 0351   MCH 32.0 01/01/2014 0351   MCHC 31.8 01/01/2014 0351   RDW 14.8 01/01/2014 0351   LYMPHSABS 0.7 12/16/2013 0815   MONOABS 0.4 12/19/2013 0815   EOSABS 0.0 12/19/2013 0815   BASOSABS 0.0 12/18/2013 0815   Lab Results  Component Value Date   CREATININE 8.41* 01/01/2014   CREATININE 7.74* 12/31/2013   CREATININE 6.42* 12/30/2013   Assessment/Plan:  1. AKI -  SCr continues to trend up to 8.4 today however at a slower rate than previously. UOP at 975 however patient appears to not have adequate intake, 400cc q4 of free water added yesterday however hypernatremia not improved. Not a good candidate for long term dialysis .  Continues with poor mental function which could in part be attributed to his Uremia.  Will take for HD today.  Palliative care discussion noted for this PM. 2. Hypernatremia:3L free water  deficit, will go for HD today, continue free water supplementation. 3. Sepsis 2/2 to pyelonephritis - completed abx 4. Thrombocytopenia - HIT negative 5. Malnutrition: Diet Dys1 6. Acute encephalopathy/seizures/ spasticity - Not a Neurosx candidate, plan per neurology  Gust Rung, DO  [facility], PGY2

## 2014-01-01 NOTE — Progress Notes (Signed)
HD tx restarted w/ no problems. Dr. Juel Burrow placed new temp. Catheter. Verified by chest x-ray

## 2014-01-01 NOTE — Progress Notes (Signed)
I have personally seen and examined this patient and agree with the assessment/plan as outlined above by Mikey Bussing DO (PGY2). Will do dialysis today for clearance.  Need to establish goals of care as he is not capable of undergoing chronic out-patient hemodialysis and it is now apparent that even his current/acute dialysis is not adding much to his overall recovery.  Emmanuel Gruenhagen K.,MD 01/01/2014 10:23 AM

## 2014-01-01 NOTE — Progress Notes (Signed)
Due to pt restlessness catheter came partly out during HD tx. Tx paused, MD notified, coming in to evaluate.

## 2014-01-02 ENCOUNTER — Inpatient Hospital Stay (HOSPITAL_COMMUNITY): Payer: Medicaid Other

## 2014-01-02 DIAGNOSIS — E870 Hyperosmolality and hypernatremia: Secondary | ICD-10-CM

## 2014-01-02 DIAGNOSIS — D696 Thrombocytopenia, unspecified: Secondary | ICD-10-CM

## 2014-01-02 DIAGNOSIS — M4716 Other spondylosis with myelopathy, lumbar region: Secondary | ICD-10-CM

## 2014-01-02 DIAGNOSIS — N179 Acute kidney failure, unspecified: Secondary | ICD-10-CM

## 2014-01-02 LAB — CBC
HEMATOCRIT: 25.2 % — AB (ref 39.0–52.0)
Hemoglobin: 8 g/dL — ABNORMAL LOW (ref 13.0–17.0)
MCH: 32.7 pg (ref 26.0–34.0)
MCHC: 31.7 g/dL (ref 30.0–36.0)
MCV: 102.9 fL — ABNORMAL HIGH (ref 78.0–100.0)
PLATELETS: 108 10*3/uL — AB (ref 150–400)
RBC: 2.45 MIL/uL — ABNORMAL LOW (ref 4.22–5.81)
RDW: 14.9 % (ref 11.5–15.5)
WBC: 5.4 10*3/uL (ref 4.0–10.5)

## 2014-01-02 LAB — URINALYSIS, ROUTINE W REFLEX MICROSCOPIC
Bilirubin Urine: NEGATIVE
Glucose, UA: NEGATIVE mg/dL
Ketones, ur: NEGATIVE mg/dL
NITRITE: NEGATIVE
Protein, ur: 100 mg/dL — AB
SPECIFIC GRAVITY, URINE: 1.011 (ref 1.005–1.030)
Urobilinogen, UA: 0.2 mg/dL (ref 0.0–1.0)
pH: 8.5 — ABNORMAL HIGH (ref 5.0–8.0)

## 2014-01-02 LAB — BASIC METABOLIC PANEL
ANION GAP: 11 (ref 5–15)
BUN: 39 mg/dL — ABNORMAL HIGH (ref 6–23)
CALCIUM: 8.4 mg/dL (ref 8.4–10.5)
CO2: 27 mEq/L (ref 19–32)
Chloride: 104 mEq/L (ref 96–112)
Creatinine, Ser: 4.85 mg/dL — ABNORMAL HIGH (ref 0.50–1.35)
GFR calc Af Amer: 15 mL/min — ABNORMAL LOW (ref >90)
GFR, EST NON AFRICAN AMERICAN: 13 mL/min — AB (ref >90)
Glucose, Bld: 93 mg/dL (ref 70–99)
Potassium: 3.8 mEq/L (ref 3.7–5.3)
SODIUM: 142 meq/L (ref 137–147)

## 2014-01-02 LAB — GLUCOSE, CAPILLARY
GLUCOSE-CAPILLARY: 129 mg/dL — AB (ref 70–99)
GLUCOSE-CAPILLARY: 131 mg/dL — AB (ref 70–99)
GLUCOSE-CAPILLARY: 82 mg/dL (ref 70–99)
GLUCOSE-CAPILLARY: 95 mg/dL (ref 70–99)
Glucose-Capillary: 97 mg/dL (ref 70–99)
Glucose-Capillary: 94 mg/dL (ref 70–99)
Glucose-Capillary: 131 mg/dL — ABNORMAL HIGH (ref 70–99)

## 2014-01-02 LAB — URINE MICROSCOPIC-ADD ON

## 2014-01-02 LAB — SEDIMENTATION RATE: Sed Rate: 73 mm/hr — ABNORMAL HIGH (ref 0–16)

## 2014-01-02 MED ORDER — PREGABALIN 25 MG PO CAPS
25.0000 mg | ORAL_CAPSULE | Freq: Two times a day (BID) | ORAL | Status: DC
  Administered 2014-01-02: 25 mg
  Filled 2014-01-02: qty 1

## 2014-01-02 MED ORDER — PREGABALIN 25 MG PO CAPS: 25.0000 mg | ORAL_CAPSULE | Freq: Three times a day (TID) | ORAL | Status: DC

## 2014-01-02 MED ORDER — PREDNISOLONE 15 MG/5ML PO SOLN
60.0000 mg | Freq: Every day | ORAL | Status: DC
  Administered 2014-01-02: 60 mg
  Filled 2014-01-02 (×2): qty 20

## 2014-01-02 NOTE — Progress Notes (Signed)
[facility] TEAM 1 - Stepdown/ICU TEAM Progress Note  GUMARO BRIGHTBILL FIE:[id number] DOB: 16-Aug-1965 DOA: 12/10/2013 PCP: Default, Provider, MD  Admit HPI / Brief Narrative: 48 y/o M SNF resident with Hx of mild developmental delay, DJD, O2 dependent COPD / Tobacco Abuse, OSA, Cervical / Lumbosacral spondylosis & recent admission to [facility] for back pain & falls who presented to [facility] on 12/30/2013 with fever and AMS. Reportedly he had a worsening mental status for 3-4 days prior to admission. Work up at [place] was remarkable for UTI, lactic acid 1.1, Na 140, K 5.1, AG 22, sr cr 8.95, Alk phos 106, CK 560, INR 1.2, WBC 9.6, Hgb 11.7. Given acute kidney injury, patient was transferred to [facility] for further care.   Upon arrival to [facility], the patient was assessed by [facility].  The MD had concerns for posturing, inability to protect his airway, and shock and PCCM was emergently consulted for evaluation. Patient was intubated and HD catheter placed by PCCM.     Pt was treated aggressively for septic shock due to pyelonephritis. Pressors were eventually weaned and dc'd. He also required CRRT for AKI but unfortunately his renal function has not recovered and he has required PRN dialysis since then. He had failed swallowing evaluations and required feeds via tube. He finally was cleared for a dysphagia 1 diet but has not been alert enough to maintain adequate caloric intake therefore bolus tube feedings initiated. As a precaution TID bolus feeds also in place. He has also been very confused and has required restraints. He is not a good candidate for OP HD due to mentation and nonambulatory status in the setting of severe progressive functional decline. Palliative Care was consulted to discuss goals of care with the family.  Palliative has met with the family. Pt made a DNR and family deciding as to whether to change focus to comfort and thus transition to residential Hospice. (see note from  8/26 for details). Based on theor assessment this patient has had rapid functional decline since July 2015. Prior to this hospitalization he was alert and talkative and appropriate. Palliative has adjusted his pain meds and on 8/27 low dose Lyrica has been added for presumed neuropathic pain. Nephro documented 8/27 that mentation has not improved despite HD and recommend comfort focus.  Significant Events: 8/14 Admit to [facility] from [place], SNF resident with fever, AMS, hypotension. Intubated for airway protection. Vasopressors initiated  8/14 Neuro consult: Valproic acid initiated  8/14 Renal consult: CRRT initiated  8/15 Vasopressors weaned to off  8/16 ?recurrent seizures >> VPA level low  8/18 Off CRRT  8/19 Extubated  8/20 Transfer to SDU 8/24 Palliative care consulted-family meeting completed 8/26  HPI/Subjective: Awake and endorses significant pain with removal of footies-speech still not clear and remains repetitive.  Assessment/Plan:  Acute respiratory failure 2nd to septic shock Resolved - extubated 8/19 - sepsis physiology has also reslved  Septic shock 2nd to pyelonephritis multiple organism in urine cx 8/14 - has completed 7 day course of Zosyn   AKI, anuric 2nd to pyelonephritis, septic shock, myelopathy and neurogenic bladder Nephrology following - no hydronephrosis from renal ultra sound - MRI noted distended bladder suggesting neurogenic bladder - foley cath - see Nephrology notes - per Nephrologist he is not capable of undergoing outpt HD and current HD has not added much to his overall recovery-now suggest comfort focus  Hypernatremia Resolved-Cont free water per tube  Hypokalemia replete per tube gently - follow lytes  Thrombocytopenia HIT panel negative - plt count remains low but > 100,000 but trend is downward - (likely due to gram negative infxn)-will repeat UA as precaution esp in setting of persistent AMS  Spacticity/spasms of lower extremities Neurology  following - records suggest pt was ambulating w/ a rolling walker as late as 11/28/2013 - pt had myelogram 7/30 for this issue, with study noting most severe issue being "moderate" spinal stenosis at C3-C4 - has been evaluated by a Neurosurgery earlier this month w/ full MRI of lumbar spine under anesthesia and it was not felt that surgical intervention was indicated - initiated baclofen and follow -OOB to chair TID and cont PT attempts -Currently we do not have a diagnosis to tie in all patient's symptoms ALS vs MCTD vs temporal arteritis?  a. Will obtain ESR, CRP  b. Will obtain ANA, RF, RNP, ANCA, IgG, IgM, IgA   c. TSH was within normal limit  d. the patient started empirically on prednisolone 60 mg daily Temporal Arteritis?)  e. Obtain MRI brain, C-spine, lumbosacral with contrast  f. Reconsult neurology for EMG  Acute Encephalopathy 2nd to seizures, sepsis, AKI Mental status slow to change/improve - seems to wax and wane with alertness - dc'd any sedating meds as of 8/26 without change and due to ongoing pain resumed-Valproic acid level normal  **RN paged- unequal pupils which appear new and more lethargic-I can't recall if this is baseline (anisocoria?) so obtained CT Head which was unremarkable- also now with fever 101-previously had ordered UA and cx-new lethargy likely from fever and recent initiation of Lyrica.   Discussed with Palliative- concern over possible paraneoplastic syndrome as cause to rapid functional decline-family would like answers if possible before changing to comfort- if CT Head unrevealing consider contrasted CT Brain.Discussed with Dr. Sherral Hammers and ALS also in the differential- he will research this and order appropriate work up if indicated.   COPD Well compensated at this time -no wheezing  Hx of OSA  Bradycardia Resolved  Dysphagia 8/24 passed eval and OK for D1 but due to ongoing lethargy and difficulty following commands he has been unable maintain adequate  caloric intake so will cont enteric feeds via bolus per tube - not a great candidate for PEG  Anemia of critical illness -Hgb stable around 8.5-9 - baseline hgb ~11  Code Status: FULL Family Communication: no family present at time of exam-updated sister Delila Spence on 8/24 Disposition Plan: SDU - Palliative Care consulted and family considering comfort measures and residential Hospice  Consultants: Neurology Nephrology  Antibiotics: none  DVT prophylaxis: SQ heparin   Objective: Blood pressure 108/51, pulse 87, temperature 98.3 F (36.8 C), temperature source Axillary, resp. rate 12, height 5' 2"  (1.575 m), weight 173 lb 11.6 oz (78.8 kg), SpO2 100.00%.  Intake/Output Summary (Last 24 hours) at 01/02/14 1029 Last data filed at 01/02/14 1018  Gross per 24 hour  Intake    757 ml  Output    650 ml  Net    107 ml   Exam: General: No acute respiratory distress - awake and restless Lungs: Clear to auscultation bilaterally without wheezes or crackles Cardiovascular: Regular rate and rhythm without murmur gallop or rub-peripheral pulse legs 2+ bilaterally Abdomen: Nontender, nondistended, soft, bowel sounds positive, no rebound, no ascites, no appreciable mass Extremities: No significant cyanosis, clubbing, or edema bilateral lower extremities  Neuro: Speech mumbled and repetitive- no focal CN deficits noted - keeps legs and arms in a flexed posture but - extemities extend  w/ examiner effort, but pt will purposefully return to flexed - Babinski present B feet - does not FSC-feet exquisitely tender with any touch or manipulation  Data Reviewed: Basic Metabolic Panel:  Recent Labs Lab 12/27/13 0521  12/29/13 0315 12/30/13 0313 12/31/13 0528 01/01/14 0351 01/02/14 0436  NA 146  < > 142 144 148* 149* 142  K 4.0  < > 3.3* 3.0* 3.7 3.7 3.8  CL 106  < > 101 104 108 109 104  CO2 24  < > 28 27 26 26 27   GLUCOSE 72  < > 98 90 92 99 93  BUN 70*  < > 39* 45* 56* 72* 39*   CREATININE 6.88*  < > 4.99* 6.42* 7.74* 8.41* 4.85*  CALCIUM 8.5  < > 8.3* 8.2* 8.4 8.6 8.4  PHOS 6.7*  --   --   --   --   --   --   < > = values in this interval not displayed.  Liver Function Tests:  Recent Labs Lab 12/27/13 0521 12/28/13 0530 12/29/13 0315 12/30/13 0313  AST  --  24 24 25   ALT  --  35 35 34  ALKPHOS  --  71 70 68  BILITOT  --  <0.2* <0.2* <0.2*  PROT  --  5.0* 5.1* 5.2*  ALBUMIN 1.6* 1.6* 1.7* 1.7*   CBC:  Recent Labs Lab 12/29/13 0312 12/30/13 0313 12/31/13 0528 01/01/14 0351 01/02/14 0436  WBC 4.8 4.9 4.6 5.3 5.4  HGB 9.1* 8.9* 7.7* 7.8* 8.0*  HCT 27.0* 26.6* 24.7* 24.5* 25.2*  MCV 97.5 97.4 101.2* 100.4* 102.9*  PLT 142* 136* 117* 114* 108*   CBG:  Recent Labs Lab 01/01/14 1701 01/01/14 2202 01/02/14 0042 01/02/14 0440 01/02/14 0751  GLUCAP 85 97 131* 95 94    Studies:  Recent x-ray studies have been reviewed in detail by the Attending Physician  Scheduled Meds:  Scheduled Meds: . acetaminophen  1,000 mg Per Tube TID  . antiseptic oral rinse  7 mL Mouth Rinse QID  . baclofen  5 mg Per Tube Daily  . chlorhexidine  15 mL Mouth Rinse BID  . diazepam  5 mg Oral QHS  . feeding supplement (NEPRO CARB STEADY)  237 mL Per Tube TID PC  . feeding supplement (PRO-STAT SUGAR FREE 64)  30 mL Per Tube BID  . free water  400 mL Per Tube 6 times per day  . heparin subcutaneous  5,000 Units Subcutaneous 3 times per day  . HYDROmorphone  2 mg Per Tube TID  . pregabalin  25 mg Per Tube BID  . senna-docusate  1 tablet Per Tube QHS  . Valproic Acid  750 mg Per Tube TID    Time spent on care of this patient: 35 mins   ELLIS,ALLISON L. , ANP   [facility] Office  [phone] Pager - Text Page per Shea Evans as per below:  On-Call/Text Page:      [email or web address]      password [email or web address]  If 7PM-7AM, please contact night-coverage [email or web address] Password [email or web address] 01/02/2014, 10:29 AM   LOS: 13 days   Examined patient and discussed assessment  and plan with ANP Ebony Hail, and agree with the above plan. Patient with multiple complex medical problems>40 min spent on direct patient care

## 2014-01-02 NOTE — Progress Notes (Signed)
Speech Language Pathology Treatment: Dysphagia  Patient Details Name: [name] MRN: [id number] DOB: 06-11-1965 Today's Date: 01/02/2014 Time: 8469-6295 SLP Time Calculation (min): 18 min  Assessment / Plan / Recommendation Clinical Impression  Pt was alert and repositioned in bed by SLP for more upright posture to maximize safety. SLP administered trials of nectar thick liquids by spoon with adequate oral acceptance, although with no attempts at bolus transit despite Max cueing from therapist. SLP ultimately orally suctioned liquid from his mouth, although vocal quality was noted to be wet. Pt consumed Dys 1 textures with Max cueing from SLP for sustained attention and posterior transit, as he has a tendency for oral holding. A delayed cough was noted upon end of session and oral suction yielded Dys 1 textures - unclear if this could have been expectorated from pharynx/larynx into oral cavity (vocal quality had been clear) or if this could have been oral residuals. SLP to continue to follow. Recommend to proceed with current diet only when patient is maximally alert and engaged.   HPI HPI: Pt is a 48 year old male with a history of developmental disability, admitted with pyelonephritis, septic shock and ARF. Pt intubated on 8/14, extubated 8/19 and having BiPAP overnight. Pt has had recurrent seizures and marked spasticity and discomfort of lower extremities with spasms. MD reports dysphagia in note, ?history. NO history of seeing SLP in notes.    Pertinent Vitals Pain Assessment: Faces Faces Pain Scale: Hurts a little bit Pain Intervention(s): Other (comment) (RN present and aware)  SLP Plan  Continue with current plan of care    Recommendations Diet recommendations: Dysphagia 1 (puree);Nectar-thick liquid Liquids provided via: Teaspoon Medication Administration: Crushed with puree Supervision: Full supervision/cueing for compensatory strategies Compensations: Slow rate;Small  sips/bites;Check for anterior loss Postural Changes and/or Swallow Maneuvers: Seated upright 90 degrees              Oral Care Recommendations: Oral care Q4 per protocol Follow up Recommendations: Skilled Nursing facility;24 hour supervision/assistance Plan: Continue with current plan of care    GO      Maxcine Ham, M.A. CCC-SLP [phone]  Maxcine Ham 01/02/2014, 4:44 PM

## 2014-01-02 NOTE — Progress Notes (Signed)
I have personally seen and examined this patient and agree with the assessment/plan as outlined above by Mikey Bussing DO (PGY2). Dialysis was done yesterday to try and see if his mental status improved---none noted. This prompts me to lean more toward a comfort care approach as he would not benefit from long-term HD. Non-oliguric UOP but no clearance/functional renal recovery. Appreciate input from Palliative care.   Jahara Dail K.,MD 01/02/2014 10:12 AM

## 2014-01-02 NOTE — Progress Notes (Signed)
OT Cancellation Note  Patient Details Name: NASIR BRIGHT MRN: [id number] DOB: 1966-05-07   Cancelled Treatment:    Reason Eval/Treat Not Completed: Fatigue/lethargy limiting ability to participate - pt lethargic.  Appears very comfortable.  RN reports he was crying out in pain earlier.  Pt does not arouse easily - will defer at this time as he has not appeared this comfortable in my interactions with him.   Angelene Giovanni Ashley, OTR/L [phone]  01/02/2014, 11:36 AM

## 2014-01-02 NOTE — Progress Notes (Signed)
S:Groaning, will answer orientation question "[name]."  But otherwise not responsive to questions or commands. O:BP 108/51  Pulse 87  Temp(Src) 98.3 F (36.8 C) (Axillary)  Resp 12  Ht  (1.575 m)  Wt 173 lb 11.6 oz (78.8 kg)  BMI 31.77 kg/m2  SpO2 100%  Intake/Output Summary (Last 24 hours) at 01/02/14 4098 Last data filed at 01/02/14 0615  Gross per 24 hour  Intake      0 ml  Output    650 ml  Net   -650 ml   Intake/Output: I/O last 3 completed shifts: In: 1400 [I.V.:100; NG/GT:1200; IV Piggyback:100] Out: 1725 [Urine:1975]  Intake/Output this shift:    Weight change: 8 lb 13.1 oz (4 kg) Gen:in bed, groaning CVS:RRR, no r/m/g Resp:bibasilar crackles JXB:JYNW, NT, ND. + BS GNF:AOZHY edema, non-tender, moves all 4 extremities   Recent Labs Lab 12/27/13 0521 12/28/13 0530 12/29/13 0315 12/30/13 0313 12/31/13 0528 01/01/14 0351 01/02/14 0436  NA 146 146 142 144 148* 149* 142  K 4.0 4.0 3.3* 3.0* 3.7 3.7 3.8  CL 106 105 101 104 108 109 104  CO2 GLUCOSE 72 72 98 90 92 99 93  BUN 70* 85* 39* 45* 56* 72* 39*  CREATININE 6.88* 8.42* 4.99* 6.42* 7.74* 8.41* 4.85*  ALBUMIN 1.6* 1.6* 1.7* 1.7*  --   --   --   CALCIUM 8.5 8.2* 8.3* 8.2* 8.4 8.6 8.4  PHOS 6.7*  --   --   --   --   --   --   AST  --  --   --   --   ALT  --  35 35 34  --   --   --    Liver Function Tests:  Recent Labs Lab 12/28/13 0530 12/29/13 0315 12/30/13 0313  AST ALT 35 35 34  ALKPHOS 71 70 68  BILITOT <0.2* <0.2* <0.2*  PROT 5.0* 5.1* 5.2*  ALBUMIN 1.6* 1.7* 1.7*    CBC:  Recent Labs Lab 12/29/13 0312 12/30/13 0313 12/31/13 0528 01/01/14 0351 01/02/14 0436  WBC 4.8 4.9 4.6 5.3 5.4  HGB 9.1* 8.9* 7.7* 7.8* 8.0*  HCT 27.0* 26.6* 24.7* 24.5* 25.2*  MCV 97.5 97.4 101.2* 100.4* 102.9*  PLT 142* 136* 117* 114* 108*   CBG:  Recent Labs Lab 01/01/14 1701 01/01/14 2202 01/02/14 0042 01/02/14 0440 01/02/14 0751  GLUCAP 85 97  131* 95 94   Studies/Results: Dg Chest Port 1 View  01/01/2014   CLINICAL DATA:  Status post dialysis catheter placement.  EXAM: PORTABLE CHEST - 1 VIEW  COMPARISON:  Single view of the chest 12/25/2013.  FINDINGS: Double lumen central venous catheter from right IJ approach is in place. Tip of the catheter projects over the lower superior vena cava. No pneumothorax. Feeding tube tip is in the stomach. Lung volumes are low with discoid atelectasis in the left lung base. No pulmonary edema, pneumothorax or effusion. Heart size is normal. Remote bilateral rib fractures are noted.  IMPRESSION: Right IJ catheter tip projects over the lower superior vena cava. Negative for pneumothorax or acute disease.   Electronically Signed   By: Drusilla Kanner M.D.   On: 01/01/2014 19:29   . acetaminophen  1,000 mg Per Tube TID  . antiseptic oral rinse  7 mL Mouth Rinse QID  . baclofen  5 mg Per Tube Daily  . chlorhexidine  15 mL Mouth  Rinse BID  . diazepam  5 mg Oral QHS  . feeding supplement (NEPRO CARB STEADY)  237 mL Per Tube TID PC  . feeding supplement (PRO-STAT SUGAR FREE 64)  30 mL Per Tube BID  . free water  400 mL Per Tube 6 times per day  . heparin subcutaneous  5,000 Units Subcutaneous 3 times per day  . HYDROmorphone  2 mg Per Tube TID  . senna-docusate  1 tablet Per Tube QHS  . Valproic Acid  750 mg Per Tube TID    BMET    Component Value Date/Time   NA 142 01/02/2014 0436   K 3.8 01/02/2014 0436   CL 104 01/02/2014 0436   CO2 27 01/02/2014 0436   GLUCOSE 93 01/02/2014 0436   BUN 39* 01/02/2014 0436   CREATININE 4.85* 01/02/2014 0436   CALCIUM 8.4 01/02/2014 0436   GFRNONAA 13* 01/02/2014 0436   GFRAA 15* 01/02/2014 0436   CBC    Component Value Date/Time   WBC 5.4 01/02/2014 0436   RBC 2.45* 01/02/2014 0436   RBC 4.28 09/21/2012 0436   HGB 8.0* 01/02/2014 0436   HCT 25.2* 01/02/2014 0436   PLT 108* 01/02/2014 0436   MCV 102.9* 01/02/2014 0436   MCH 32.7 01/02/2014 0436   MCHC 31.7 01/02/2014  0436   RDW 14.9 01/02/2014 0436   LYMPHSABS 0.7 12/19/2013 0815   MONOABS 0.4 12/11/2013 0815   EOSABS 0.0 01/03/2014 0815   BASOSABS 0.0 12/08/2013 0815   Lab Results  Component Value Date   CREATININE 4.85* 01/02/2014   CREATININE 8.41* 01/01/2014   CREATININE 7.74* 12/31/2013   Assessment/Plan:  1. ARF -  Underwent HD yesterday, not a candidate for long term HD, Palliative care had discussion with family and are moving toward comfort care.  He did have improved UOP yesterday at 1.3L which may reflect better hydration as his hypernatremia has improved with dialysis/ increased free water supplementation.  2. Hypernatremia:resolved, continue free water 400cc q4 3. Sepsis 2/2 to pyelonephritis - completed abx 4. Thrombocytopenia - HIT negative 5. Malnutrition: Diet Dys1 6. Anemia: Macrocytic, likely multifactorial given critical illness, Iron studies wnl, folate wnl, consider checking B12. 7. Acute encephalopathy/seizures/ spasticity/spinal stenosis - Not a Neurosx candidate, plan per neurology/primary team. Palliative suggest possible trial of high dose steroids.  Gust Rung, DO  [facility], PGY2

## 2014-01-03 DIAGNOSIS — R259 Unspecified abnormal involuntary movements: Secondary | ICD-10-CM

## 2014-01-03 DIAGNOSIS — J441 Chronic obstructive pulmonary disease with (acute) exacerbation: Secondary | ICD-10-CM

## 2014-01-03 DIAGNOSIS — Z515 Encounter for palliative care: Secondary | ICD-10-CM

## 2014-01-03 LAB — BASIC METABOLIC PANEL
ANION GAP: 15 (ref 5–15)
BUN: 59 mg/dL — ABNORMAL HIGH (ref 6–23)
CO2: 26 mEq/L (ref 19–32)
CREATININE: 6.21 mg/dL — AB (ref 0.50–1.35)
Calcium: 8.8 mg/dL (ref 8.4–10.5)
Chloride: 100 mEq/L (ref 96–112)
GFR, EST AFRICAN AMERICAN: 11 mL/min — AB (ref >90)
GFR, EST NON AFRICAN AMERICAN: 10 mL/min — AB (ref >90)
Glucose, Bld: 150 mg/dL — ABNORMAL HIGH (ref 70–99)
POTASSIUM: 4.9 meq/L (ref 3.7–5.3)
Sodium: 141 mEq/L (ref 137–147)

## 2014-01-03 LAB — BLOOD GAS, ARTERIAL
ACID-BASE EXCESS: 1.7 mmol/L (ref 0.0–2.0)
BICARBONATE: 28.5 meq/L — AB (ref 20.0–24.0)
Drawn by: 275531
O2 Content: 3 L/min
O2 Saturation: 98.7 %
PO2 ART: 151 mmHg — AB (ref 80.0–100.0)
Patient temperature: 98.6
TCO2: 30.6 mmol/L (ref 0–100)
pCO2 arterial: 69.6 mmHg (ref 35.0–45.0)
pH, Arterial: 7.236 — ABNORMAL LOW (ref 7.350–7.450)

## 2014-01-03 LAB — CBC
HCT: 28.4 % — ABNORMAL LOW (ref 39.0–52.0)
Hemoglobin: 8.8 g/dL — ABNORMAL LOW (ref 13.0–17.0)
MCH: 32.8 pg (ref 26.0–34.0)
MCHC: 31 g/dL (ref 30.0–36.0)
MCV: 106 fL — ABNORMAL HIGH (ref 78.0–100.0)
PLATELETS: 138 10*3/uL — AB (ref 150–400)
RBC: 2.68 MIL/uL — ABNORMAL LOW (ref 4.22–5.81)
RDW: 14.9 % (ref 11.5–15.5)
WBC: 7.7 10*3/uL (ref 4.0–10.5)

## 2014-01-03 LAB — URINE CULTURE
CULTURE: NO GROWTH
Colony Count: NO GROWTH

## 2014-01-03 LAB — GLUCOSE, CAPILLARY
GLUCOSE-CAPILLARY: 154 mg/dL — AB (ref 70–99)
GLUCOSE-CAPILLARY: 148 mg/dL — AB (ref 70–99)
Glucose-Capillary: 107 mg/dL — ABNORMAL HIGH (ref 70–99)
Glucose-Capillary: 147 mg/dL — ABNORMAL HIGH (ref 70–99)
Glucose-Capillary: 243 mg/dL — ABNORMAL HIGH (ref 70–99)

## 2014-01-03 LAB — RAPID HIV SCREEN (WH-MAU): Rapid HIV Screen: NONREACTIVE

## 2014-01-03 LAB — C-REACTIVE PROTEIN: CRP: 11.8 mg/dL — ABNORMAL HIGH (ref <0.60)

## 2014-01-03 LAB — RHEUMATOID FACTOR: Rhuematoid fact SerPl-aCnc: <10 IU/mL (ref <=14)

## 2014-01-03 MED ORDER — DEXTROSE 5 % IV SOLN
1.0000 g | INTRAVENOUS | Status: DC
  Administered 2014-01-03 – 2014-01-04 (×2): 1 g via INTRAVENOUS
  Filled 2014-01-03 (×3): qty 10

## 2014-01-03 MED ORDER — DIAZEPAM 5 MG/ML IJ SOLN
2.5000 mg | Freq: Three times a day (TID) | INTRAMUSCULAR | Status: DC
  Administered 2014-01-03: 2.5 mg via INTRAVENOUS
  Filled 2014-01-03: qty 2

## 2014-01-03 MED ORDER — HYDROMORPHONE BOLUS VIA INFUSION
0.5000 mg | INTRAVENOUS | Status: DC | PRN
  Filled 2014-01-03: qty 1

## 2014-01-03 MED ORDER — SODIUM CHLORIDE 0.9 % IV SOLN
INTRAVENOUS | Status: DC
  Administered 2014-01-03: 1000 mL via INTRAVENOUS

## 2014-01-03 MED ORDER — ONDANSETRON HCL 4 MG/2ML IJ SOLN
4.0000 mg | Freq: Four times a day (QID) | INTRAMUSCULAR | Status: DC | PRN
  Administered 2014-01-03: 4 mg via INTRAVENOUS
  Filled 2014-01-03: qty 2

## 2014-01-03 MED ORDER — MORPHINE SULFATE 2 MG/ML IJ SOLN: 0.5000 mg | INTRAMUSCULAR | Status: DC | PRN

## 2014-01-03 MED ORDER — NEPRO/CARBSTEADY PO LIQD
1000.0000 mL | ORAL | Status: DC
  Filled 2014-01-03 (×3): qty 1000

## 2014-01-03 MED ORDER — SODIUM CHLORIDE 0.9 % IV SOLN
0.5000 mg/h | INTRAVENOUS | Status: DC
  Administered 2014-01-03: 0.5 mg/h via INTRAVENOUS
  Filled 2014-01-03: qty 2.5

## 2014-01-03 MED ORDER — SCOPOLAMINE 1 MG/3DAYS TD PT72
1.0000 | MEDICATED_PATCH | TRANSDERMAL | Status: DC
  Administered 2014-01-03: 1.5 mg via TRANSDERMAL
  Filled 2014-01-03: qty 1

## 2014-01-03 MED ORDER — INSULIN ASPART 100 UNIT/ML ~~LOC~~ SOLN: 0.0000 [IU] | SUBCUTANEOUS | Status: DC

## 2014-01-03 MED ORDER — BISACODYL 10 MG RE SUPP: 10.0000 mg | Freq: Every day | RECTAL | Status: DC | PRN

## 2014-01-03 MED ORDER — DEXTROSE-NACL 5-0.9 % IV SOLN: INTRAVENOUS | Status: DC

## 2014-01-03 MED ORDER — HYDROMORPHONE HCL PF 1 MG/ML IJ SOLN
0.5000 mg | Freq: Once | INTRAMUSCULAR | Status: AC
  Administered 2014-01-03: 0.5 mg via INTRAVENOUS
  Filled 2014-01-03: qty 1

## 2014-01-03 MED ORDER — ONDANSETRON HCL 4 MG/2ML IJ SOLN: 4.0000 mg | Freq: Four times a day (QID) | INTRAMUSCULAR | Status: DC

## 2014-01-03 MED ORDER — NEPRO/CARBSTEADY PO LIQD
1000.0000 mL | ORAL | Status: DC
  Filled 2014-01-03 (×2): qty 1000

## 2014-01-03 MED ORDER — HYDROMORPHONE HCL PF 1 MG/ML IJ SOLN
0.2500 mg | INTRAMUSCULAR | Status: DC | PRN
  Administered 2014-01-03: 0.25 mg via INTRAVENOUS
  Filled 2014-01-03: qty 1

## 2014-01-03 MED ORDER — DEXAMETHASONE SODIUM PHOSPHATE 10 MG/ML IJ SOLN
10.0000 mg | INTRAMUSCULAR | Status: DC
  Administered 2014-01-03: 10 mg via INTRAVENOUS
  Filled 2014-01-03 (×2): qty 1

## 2014-01-03 MED ORDER — DEXTROSE 5 % IV BOLUS
1000.0000 mL | Freq: Once | INTRAVENOUS | Status: AC
  Administered 2014-01-03: 1000 mL via INTRAVENOUS

## 2014-01-03 NOTE — Progress Notes (Signed)
Palliative Medicine Team Progress Note  Patient now on BiPap, during my evaluation he vomited a large volume of bilious fluid into his BiPap and aspirated. He is moaning loudly, his extremities are contracting and his legs are drawing up to his chest. I spoke with his sister Efraim Kaufmann who endorses full comfort care. She is on her way to the hospital from [place]. I discussed how serious his current condition is and that I do not think he will survive leaving the hospital. I still have grave concerns that we have not fully characterized the cause of death other than complications of sepsis and renal failure in a 48 yo man that was walking and function a little more than a month ago and now appears to be having a rapid neurological decline with spasicity, respiratory muscle failure, and encephalopathy- with a severely elevated CRP, Sed Rate, Elevated CSF Protein, Pathologic LE reflexes/spacicity, inability to walk, urinary retention and encephalopathy.  For his comfort I have ordered the following: 1. Hydromorphone (avoid morphine in renal failure can cause myoclonus) IV prn 2. Given the large amount of vomit I stopped his tube feedings and pulled the NG as a comfort measure 3. CBGs discontinued and TID heparin injections 4. Continue PRN valium  At this point stopping HD, moving him out of stepdown and shifting to full comfort would be most appropriate, there is clearly not a reversible condition at this point. I do however believe there is value in knowing what his diagnosis is if this can be achieved without making him uncomfortable ie. Contrasted MRI, additional blood tests. Otherwise I would recommend autopsy for clarification.  Time: 4PM-5:15PM Greater than 50%  of this time was spent counseling and coordinating care related to the above assessment and plan.   Anderson Malta, DO Palliative Medicine [phone]

## 2014-01-03 NOTE — Progress Notes (Addendum)
NUTRITION FOLLOW UP  DOCUMENTATION CODES Per approved criteria  -Obesity Unspecified   INTERVENTION: Re-initiate Nepro formula at 15 ml/hr and increase by 10 ml every 4 hours to goal rate of 45 ml/hr with Prostat liquid protein 30 ml BID to provie 2144 kcals, 117 gm protein, 785 ml of free water RD to follow for nutrition care plan  NUTRITION DIAGNOSIS: Inadequate oral intake related to inability to eat as evidenced by NPO, ongoing   New Goal: Pt to meet >/= 90% of their estimated nutrition needs, currently unmet  Monitor:  TF regimen & tolerance, weight, labs, I/O's  ASSESSMENT: 48 y/o M, SNF Resident, with PMH of Developmental delay, DJD, O2 dependent COPD / Tobacco Abuse, OSA, Cervical / Lumbosacral spondylosis & recent admission to [facility] for back pain & falls who presented to [facility] on 01-17-14 with fever and AMS. Reportedly he has had a worsening mental status for 3-4 days prior to admission. Work up at [facility] remarkable for UA positive for UTI. Given acute kidney injury, patient was transferred to [facility] for further care. Upon arrival to [facility], the patient was assessed by [facility] and found to have concerns for posturing and inability to protect his airway and shock and PCCM emergently consulted for evaluation. Patient was intubated and HD catheter placed after assessment.  Patient s/p EEG 8/17.  Patient extubated 8/19.    S/p MBSS 8/24 -- SLP recommending Dys 1, nectar-thick liquid diet.  Previous TF regimen D/C'd 8/25.  PO intake has been very poor.  Panda tube still in place (tip in stomach).    Patient back to NPO status.  TF orders to be resumed (Nepro at 60 ml/hr with Prostat liquid protein 30 ml BID) -- to hold while patient on BiPAP.  Patient continues to receive IHD.  RD with TF management privileges, initially consulted 8/24.  Height: Ht Readings from Last 1 Encounters:  12/26/13  (1.575 m)    Weight: -----> fluctuating  Wt Readings  from Last 1 Encounters:  01/03/14 175 lb 7.8 oz (79.6 kg)  Admit wt          191 lb (86.8 kg)  BMI:  Body mass index is 32.09 kg/(m^2).  Re-estimated Nutritional Needs: Kcal: 2000-2200 Protein: 110-120 gm Fluid: per MD  Skin: Intact  Diet Order: NPO   Intake/Output Summary (Last 24 hours) at 01/03/14 1241 Last data filed at 01/03/14 0955  Gross per 24 hour  Intake    800 ml  Output    300 ml  Net    500 ml   Labs:   Recent Labs Lab 01/01/14 0351 01/02/14 0436 01/03/14 0417  NA 149* 142 141  K 3.7 3.8 4.9  CL 109 104 100  CO2 BUN 72* 39* 59*  CREATININE 8.41* 4.85* 6.21*  CALCIUM 8.6 8.4 8.8  GLUCOSE 99 93 150*    CBG (last 3)   Recent Labs  01/03/14 0102 01/03/14 0437 01/03/14 0740  GLUCAP 107* 154* 147*    Scheduled Meds: . acetaminophen  1,000 mg Per Tube TID  . antiseptic oral rinse  7 mL Mouth Rinse QID  . baclofen  5 mg Per Tube Daily  . cefTRIAXone (ROCEPHIN)  IV  1 g Intravenous Q24H  . chlorhexidine  15 mL Mouth Rinse BID  . feeding supplement (PRO-STAT SUGAR FREE 64)  30 mL Per Tube BID  . free water  400 mL Per Tube 6 times per day  .  heparin subcutaneous  5,000 Units Subcutaneous 3 times per day  . prednisoLONE  60 mg Per Tube QAC breakfast  . senna-docusate  1 tablet Per Tube QHS  . Valproic Acid  750 mg Per Tube TID    Continuous Infusions: . dextrose 5 % and 0.9% NaCl    . feeding supplement (NEPRO CARB STEADY)      Past Medical History  Diagnosis Date  . Acute respiratory failure   . Hypoxemia   . Encephalopathy, unspecified   . Pain in limb   . Hyperosmolality and/or hypernatremia   . Dehydration   . Altered mental status   . Other specific developmental learning difficulties   . Acidosis   . Gallstone ileus   . Unspecified vitamin B deficiency   . Tobacco use disorder   . Acute conjunctivitis, unspecified   . Other chronic allergic conjunctivitis   . Unspecified essential hypertension   . Cellulitis and  abscess of unspecified site   . Muscle weakness (generalized)   . Cognitive communication deficit   . COPD (chronic obstructive pulmonary disease)   . Sleep apnea   . 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 Acute respiratory failure with hypoxia 09/20/2012  . Acute respiratory acidosis 09/20/2012  . Cellulitis of left lower extremity 09/20/2012  . OSA (obstructive sleep apnea) 09/20/2012  . Lumbosacral spondylosis without myelopathy 12/03/2013  . Degenerative joint disease of spine 12/03/2013  . Cervical spondylosis with myelopathy 12/03/2013    Past Surgical History  Procedure Laterality Date  . Eye surgery      cataracts  . Radiology with anesthesia N/A 12/09/2013    Procedure: MRI OF LUMBAR W/WO CONTRAST ;  Surgeon: Medication Radiologist, MD;  Location: [facility] OR;  Service: Radiology;  Laterality: N/A;    Maureen Chatters, RD, LDN Pager #: [phone] After-Hours Pager #: [phone]

## 2014-01-03 NOTE — Progress Notes (Signed)
Chaplain Note: Responded to request from RN to provide support for patient's family. Found patient's sister and 2 other family members at bedside. Patient not able to engage in conversation. Listened empathically as patient's sister shared patient's story. Provided emotional support and will continue to follow and provide grief care as needed. Rutherford Nail, Chaplain

## 2014-01-03 NOTE — Progress Notes (Signed)
PT Cancellation Note  Patient Details Name: [name] MRN: [id number] DOB: 10-Apr-1966   Cancelled Treatment:    Reason Eval/Treat Not Completed: Medical issues which prohibited therapy. Noted pt not responding to stimuli earlier today and started on BiPAP. Noted plans for further neurological work-up. Will continue to follow and proceed if PT is appropriate.    [name] 01/03/2014, 3:34 PM Pager [phone]

## 2014-01-03 NOTE — Progress Notes (Signed)
Patient very lethargic. A few moans noted with repositioning but no response to sternal rub. Patient was started on lyrica today and recently given valium around 5pm.  Patient having apnea and respirations from 4-8 per minute.  No agonal breathing noted.  All other VSS. O2 100% on 2L. Patient is a DNR status. NP notified. No new orders at this time. Will hold all sedating medications this pm, including lyrica, valium, and dilaudid. Will continue to monitor.  M.Foster Simpson, RN

## 2014-01-03 NOTE — Progress Notes (Signed)
Patient still lethargic and not responding to sternal rub.  However, he did clench his legs together and open eyes while foley care was performed.  Respirations still between 4-9 per minute, 100% O2 on 2L.  DNR status. Will continue to closely monitor and report to the AM nurse.    M.Foster Simpson, RN

## 2014-01-03 NOTE — Progress Notes (Signed)
Patient's sister arrived. I provided medical information and emotional support at bedside given his rapid decline this evening. Plan is to shift to full comfort care at this point.   1. Discontinue HD 2. Initiate dilaudid infusion at 0.5mg /hr and bolus for comfort 3. Scheduled Valium q8 hours for spacicity 4. Comfort Measures Only-no supplemental O2, no IV fluids, discontinued tube feeding  I anticipate a hospital death in next 24-48 hours. I also contacted Luanna Salk MD/Medical Examiner to discuss his case. ME will review case. Recommendation will likely be for medical Autopsy and I have consented his sister for autopsy to be performed. Will need to send requisition to Pathology department for review at time of death-form can be obtained from patient placement.  Appreciate Chaplain assistance at bedside.  Please call PMT at [phone] with any symptom management needs.  Anderson Malta, DO Palliative Medicine [phone]

## 2014-01-03 NOTE — Progress Notes (Signed)
Dilaudid infusion started and patient for comfort care. Family present.

## 2014-01-03 NOTE — Progress Notes (Addendum)
Patient ID: [name], male   DOB: 11-04-1965, 48 y.o.   MRN: [id number]  [facility] Progress Note   Assessment/ Plan:   1. ARF -suspected acute tubular necrosis following sepsis/bladder outlet obstruction, hemodialysis was transiently done to improve metabolic status and alleviate any uremic component to his changes of mental status however, no notable change even with good dose of dialysis. Unfortunately, he continues to overall decompensated particularly with regards to his mental status. He is in no position for chronic hemodialysis with his current functional status/limitations. Appreciate input from palliative care  2. Hypernatremia: Free water via NG tube-mental status prohibits him to drink water as recommended 3. Sepsis 2/2 to pyelonephritis - markers of sepsis resolves, he has completed abx course 4. Thrombocytopenia - likely associated with sepsis, HIT panel negative 5. Malnutrition:  getting feeds the NG tube-mental status prohibits airway protection/swallowing  6. Anemia: Macrocytic, likely multifactorial given critical illness, Iron studies wnl, folate wnl, limited utility of ESA therapy (cost to benefit ratio) 7. Acute encephalopathy/seizures/ spasticity/spinal stenosis - Not a Neurosx candidate, plan per neurology/primary team.   Called at 1345 by Junious Silk NP with [facility]--the goal is for neurology to undertake further workup in order to try and explain his recent acute neurological decline to see if there is a reversible cause that can be treated. We'll continue to provide hemodialysis until then-hemodialysis ordered for tomorrow.  Subjective:   Overnight events noted-patient progressively unresponsive now even to deep pain    Objective:   BP 101/49  Pulse 87  Temp(Src) 98.2 F (36.8 C) (Oral)  Resp 5  Ht  (1.575 m)  Wt 79.6 kg (175 lb 7.8 oz)  BMI 32.09 kg/m2  SpO2 100%  Intake/Output Summary (Last 24 hours) at 01/03/14 0805 Last data filed  at 01/03/14 8295  Gross per 24 hour  Intake   1957 ml  Output    400 ml  Net   1557 ml   Weight change: 1.1 kg (2 lb 6.8 oz)  Physical Exam: Gen: resting in bed-unresponsive to sternal rub CVS: pulse regular in rate and rhythm, S1 and S2 normal Resp: coarse breath sounds bilaterally-no distinct rales Abd: soft, flat, nontender Ext: no lower extremity edema   Imaging: Ct Head Wo Contrast  01/02/2014   CLINICAL DATA:  Altered mental status and equal pupils  EXAM: CT HEAD WITHOUT CONTRAST  TECHNIQUE: Contiguous axial images were obtained from the base of the skull through the vertex without intravenous contrast.  COMPARISON:  Head CT December 20, 2013 and brain MRI December 21, 2013  FINDINGS: The ventricles are normal size and configuration. There is no mass, hemorrhage, extra-axial fluid collection, or midline shift. There is minimal small vessel disease in the centra semiovale bilaterally. No acute appearing infarct present. No new gray-white compartment lesion. Bony calvarium appears intact. The mastoid air cells are clear. There are small retention cysts in the left maxillary antrum. There is mild mucosal thickening in several ethmoid air cells bilaterally. There is debris in the left external auditory canal.  IMPRESSION: Minimal periventricular small vessel disease. No intracranial mass, hemorrhage, or acute appearing infarct. Mild paranasal sinus disease. Probable cerumen in the left external auditory canal.   Electronically Signed   By: Bretta Bang M.D.   On: 01/02/2014 14:30   Dg Chest Port 1 View  01/01/2014   CLINICAL DATA:  Status post dialysis catheter placement.  EXAM: PORTABLE CHEST - 1 VIEW  COMPARISON:  Single view of the chest  12/25/2013.  FINDINGS: Double lumen central venous catheter from right IJ approach is in place. Tip of the catheter projects over the lower superior vena cava. No pneumothorax. Feeding tube tip is in the stomach. Lung volumes are low with discoid  atelectasis in the left lung base. No pulmonary edema, pneumothorax or effusion. Heart size is normal. Remote bilateral rib fractures are noted.  IMPRESSION: Right IJ catheter tip projects over the lower superior vena cava. Negative for pneumothorax or acute disease.   Electronically Signed   By: Drusilla Kanner M.D.   On: 01/01/2014 19:29    Labs: BMET  Recent Labs Lab 12/28/13 0530 12/29/13 0315 12/30/13 0313 12/31/13 0528 01/01/14 0351 01/02/14 0436 01/03/14 0417  NA 146 142 144 148* 149* 142 141  K 4.0 3.3* 3.0* 3.7 3.7 3.8 4.9  CL 105 101 104 108 109 104 100  CO2 GLUCOSE 72 98 90 92 99 93 150*  BUN 85* 39* 45* 56* 72* 39* 59*  CREATININE 8.42* 4.99* 6.42* 7.74* 8.41* 4.85* 6.21*  CALCIUM 8.2* 8.3* 8.2* 8.4 8.6 8.4 8.8   CBC  Recent Labs Lab 12/31/13 0528 01/01/14 0351 01/02/14 0436 01/03/14 0417  WBC 4.6 5.3 5.4 7.7  HGB 7.7* 7.8* 8.0* 8.8*  HCT 24.7* 24.5* 25.2* 28.4*  MCV 101.2* 100.4* 102.9* 106.0*  PLT 117* 114* 108* 138*   Medications:    . acetaminophen  1,000 mg Per Tube TID  . antiseptic oral rinse  7 mL Mouth Rinse QID  . baclofen  5 mg Per Tube Daily  . cefTRIAXone (ROCEPHIN)  IV  1 g Intravenous Q24H  . chlorhexidine  15 mL Mouth Rinse BID  . diazepam  5 mg Oral QHS  . feeding supplement (PRO-STAT SUGAR FREE 64)  30 mL Per Tube BID  . free water  400 mL Per Tube 6 times per day  . heparin subcutaneous  5,000 Units Subcutaneous 3 times per day  . HYDROmorphone  2 mg Per Tube TID  . prednisoLONE  60 mg Per Tube QAC breakfast  . pregabalin  25 mg Per Tube BID  . senna-docusate  1 tablet Per Tube QHS  . Valproic Acid  750 mg Per Tube TID   Zetta Bills, MD 01/03/2014, 8:05 AM

## 2014-01-03 NOTE — Progress Notes (Signed)
Feedings and feeding tube and BIPap  discontinued per order  per order of Dr. Phillips Odor.

## 2014-01-03 NOTE — Progress Notes (Signed)
[place] TEAM 1 - Stepdown/ICU TEAM Progress Note  [name] QVZ:[id number] DOB: 01-Apr-1966 DOA: 12/27/2013 PCP: Default, Provider, MD  Admit HPI / Brief Narrative: 48 y/o M SNF resident with Hx of mild developmental delay, DJD, O2 dependent COPD / Tobacco Abuse, OSA, Cervical / Lumbosacral spondylosis & recent admission to [facility] for back pain & falls who presented to [facility] on 12/19/2013 with fever and AMS. Reportedly he had a worsening mental status for 3-4 days prior to admission. Work up at [place] was remarkable for UTI, lactic acid 1.1, Na 140, K 5.1, AG 22, sr cr 8.95, Alk phos 106, CK 560, INR 1.2, WBC 9.6, Hgb 11.7. Given acute kidney injury, patient was transferred to [facility] for further care.   Upon arrival to [facility], the patient was assessed by [facility].  The MD had concerns for posturing, inability to protect his airway, and shock and PCCM was emergently consulted for evaluation. Patient was intubated and HD catheter placed by PCCM.     Pt was treated aggressively for septic shock due to pyelonephritis. Pressors were eventually weaned and dc'd. He also required CRRT for AKI but unfortunately his renal function has not recovered and he has required PRN dialysis since then. He had failed swallowing evaluations and required feeds via tube. He finally was cleared for a dysphagia 1 diet but has not been alert enough to maintain adequate caloric intake therefore bolus tube feedings initiated. As a precaution TID bolus feeds also in place. He has also been very confused and has required restraints. He is not a good candidate for OP HD due to mentation and nonambulatory status in the setting of severe progressive functional decline. Palliative Care was consulted to discuss goals of care with the family.  Palliative has met with the family. Pt made a DNR and family deciding as to whether to change focus to comfort and thus transition to residential Hospice. (see note from  8/26 for details). Based on theor assessment this patient has had rapid functional decline since July 2015. Prior to this hospitalization he was alert and talkative and appropriate. Palliative has adjusted his pain meds and on 8/27 low dose Lyrica has been added for presumed neuropathic pain. Nephro documented 8/27 that mentation has not improved despite HD and recommend comfort focus.  After reviewing palliative note and after discussing further with Dr. Rhea Pink we agreed that patient's pre-existing neurological condition had never been clarified and before actively assuming withdrawal of care the neurological workup needs to be completed. On 8/28 neurology was reconsulted; multiple serologies were obtained in an attempt to determine etiology of patient's neurological dysfunction and repeat radiographic studies also ordered. Nephrology had planned to stop dialysis due to futility but given plans to proceed with neurological evaluation will continue dialysis for now. In addition on the same date patient became oversedated secondary to meds used to control pain/discomfort from his spasticity. He was found to have respiratory acidosis with profound hypercarbia and BiPAP was initiated.  Significant Events: 8/14 Admit to [facility] from [place], SNF resident with fever, AMS, hypotension. Intubated for airway protection. Vasopressors initiated  8/14 Neuro consult: Valproic acid initiated  8/14 Renal consult: CRRT initiated  8/15 Vasopressors weaned to off  8/16 ?recurrent seizures >> VPA level low  8/18 Off CRRT  8/19 Extubated  8/20 Transfer to SDU 8/24 Palliative care consulted-family meeting completed 8/26 8/28 Neurology reconsulted 8/28 Hypercarbia with respiratory acidosis requiring BiPAP; hypotension related to oversedation requiring fluid boluses  and continuous IV fluid 8/28 Rocephin initiated for abnormal urinalysis-culture pending  HPI/Subjective: Awake and endorses significant pain with removal  of footies-speech still not clear and remains repetitive.  Assessment/Plan:  Acute respiratory failure 2nd to septic shock/recurrent with hypercarbia Resolved - extubated 8/19 - sepsis physiology has also resolved-today (8/28) patient was responsive with decreased respiratory rate; ABGs reveal hypercarbia with respiratory acidemia; suspect due to sedative effects of medication she use to treat patient's lower extremity pain and spasms-DC offending medications and use when necessary Valium and IV morphine sparingly-BiPAP -Comfort care  Spacticity/spasms of lower extremities Neurology following - records suggest pt was ambulating w/ a rolling walker as late as 11/28/2013 - pt had myelogram 7/30 for this issue, with study noting most severe issue being "moderate" spinal stenosis at C3-C4 - has been evaluated by a Neurosurgery earlier this month w/ full MRI of lumbar spine under anesthesia and it was not felt that surgical intervention was indicated - initiated baclofen and follow -OOB to chair TID and cont PT attempts -Currently we do not have a diagnosis to tie in all patient's symptoms ALS vs MCTD vs temporal arteritis?  a. ESR elevated at 73, CRP elevated at 11.8   b. ANA, RF, RNP, ANCA, IgG, IgM, IgA pending  c. TSH was within normal limit  d. Continue empiric prednisolone 60 mg daily for ?Temporal Arteritis  e. Obtain MRI brain, C-spine, lumbosacral with contrast  f. Reconsult neurology for ? EMG/input -Comfort care  Hypotension Directly related to oversedation and volume depletion-bolus and IV fluids initiated and utilizing dextrose until can resume tube feedings -Comfort care  Septic shock 2nd to pyelonephritis/recurrent abnormal urinalysis multiple organism in urine cx 8/14 - has completed 7 day course of Zosyn -2 to altered mentation and urinalysis repeated and was abnormal concerning for recurrent UTI in setting of indwelling Foley catheter-culture pending-given altered mentation we'll  begin empiric Rocephin -Comfort care  AKI, anuric 2nd to pyelonephritis, septic shock, myelopathy and neurogenic bladder Nephrology following - no hydronephrosis from renal ultra sound - MRI noted distended bladder suggesting neurogenic bladder - foley cath - see Nephrology notes - per Nephrologist he is not capable of undergoing outpt HD and current HD has not added much to his overall recovery-now suggest comfort focus and as of today's note (8/28) nephrology not offering any further dialysis but I did speak with Dr. Posey Pronto and informed him neurological workup still in process and he agrees to continue hemodialysis for now-also he informed me after talking to Dr. Posey Pronto (nephrology) MRI can be contrast without further damage to kidneys  -Comfort care  Acute Encephalopathy 2nd to seizures, sepsis, AKI and now acute hypercarbia Mental status slow to change/improve - seems to wax and wane with alertness - dc'd any sedating meds as of 8/26 without change and due to ongoing pain resumed-Valproic acid level normal-8/28 increased lethargy on exam and ABG revealed respiratory acidosis with a PCO2 of 69.6 and a pH of 7.23 -Comfort care  Dysphagia 8/24 passed eval and OK for D1 but due to ongoing lethargy and difficulty following commands he has been unable maintain adequate caloric intake so will cont enteric feeds via bolus per tube - not a great candidate for PEG-8/28 due to recurrent lethargy and need for BiPAP now n.p.o. with IV fluids only but once off BiPAP we'll go ahead and resume continuous tube feedings -Comfort care  Hypernatremia Resolved -Comfort care  Hypokalemia replete per tube gently - follow lytes -Comfort care  Thrombocytopenia HIT panel  negative - plt count remains low but > 100,000 but trend is downward - (likely due to gram negative infxn)-will repeat UA as precaution esp in setting of persistent AMS -Comfort care  COPD Well compensated at this time but did develop  hypercarbia with oversedation -no wheezing -Comfort care  Hx of OSA  Bradycardia Resolved  Anemia of critical illness -Hgb stable around 8.5-9 - baseline hgb ~11 -Comfort care     Code Status: FULL Family Communication: no family present at time of exam-updated sister Delila Spence on 8/24 Disposition Plan: SDU - Palliative Care consulted and family considering comfort measures and residential Hospice  Consultants: Neurology Nephrology  Antibiotics: none  DVT prophylaxis: SQ heparin   Objective: Blood pressure 93/49, pulse 103, temperature 97.6 F (36.4 C), temperature source Axillary, resp. rate 14, height 5' 2"  (1.575 m), weight 175 lb 7.8 oz (79.6 kg), SpO2 100.00%.  Intake/Output Summary (Last 24 hours) at 01/03/14 1336 Last data filed at 01/03/14 0955  Gross per 24 hour  Intake    800 ml  Output    300 ml  Net    500 ml   Exam: General: No acute respiratory distress - lethargic but opens eyes to voice but nonverbal today Lungs: Clear to auscultation bilaterally without wheezes or crackles, 2 L transitioned to BiPAP Cardiovascular: Regular rate and rhythm without murmur gallop or rub-peripheral pulse legs 2+ bilaterally Abdomen: Nontender, nondistended, soft, bowel sounds positive, no rebound, no ascites, no appreciable mass Extremities: No significant cyanosis, clubbing, or edema bilateral lower extremities  Neuro: Lethargic and only minimally arousable, keeps legs and arms in a flexed posture but - extemities extend w/ examiner effort, but pt will purposefully return to flexed - Babinski present B feet - does not FSC-feet exquisitely tender with any touch or manipulation  Data Reviewed: Basic Metabolic Panel:  Recent Labs Lab 12/30/13 0313 12/31/13 0528 01/01/14 0351 01/02/14 0436 01/03/14 0417  NA 144 148* 149* 142 141  K 3.0* 3.7 3.7 3.8 4.9  CL 104 108 109 104 100  CO2 27 26 26 27 26   GLUCOSE 90 92 99 93 150*  BUN 45* 56* 72* 39* 59*  CREATININE  6.42* 7.74* 8.41* 4.85* 6.21*  CALCIUM 8.2* 8.4 8.6 8.4 8.8    Liver Function Tests:  Recent Labs Lab 12/28/13 0530 12/29/13 0315 12/30/13 0313  AST 24 24 25   ALT 35 35 34  ALKPHOS 71 70 68  BILITOT <0.2* <0.2* <0.2*  PROT 5.0* 5.1* 5.2*  ALBUMIN 1.6* 1.7* 1.7*   CBC:  Recent Labs Lab 12/30/13 0313 12/31/13 0528 01/01/14 0351 01/02/14 0436 01/03/14 0417  WBC 4.9 4.6 5.3 5.4 7.7  HGB 8.9* 7.7* 7.8* 8.0* 8.8*  HCT 26.6* 24.7* 24.5* 25.2* 28.4*  MCV 97.4 101.2* 100.4* 102.9* 106.0*  PLT 136* 117* 114* 108* 138*   CBG:  Recent Labs Lab 01/02/14 2029 01/03/14 0102 01/03/14 0437 01/03/14 0740 01/03/14 1309  GLUCAP 131* 107* 154* 147* 243*    Studies:  Recent x-ray studies have been reviewed in detail by the Attending Physician  Scheduled Meds:  Scheduled Meds: . acetaminophen  1,000 mg Per Tube TID  . antiseptic oral rinse  7 mL Mouth Rinse QID  . baclofen  5 mg Per Tube Daily  . cefTRIAXone (ROCEPHIN)  IV  1 g Intravenous Q24H  . chlorhexidine  15 mL Mouth Rinse BID  . feeding supplement (PRO-STAT SUGAR FREE 64)  30 mL Per Tube BID  . free water  400 mL  Per Tube 6 times per day  . heparin subcutaneous  5,000 Units Subcutaneous 3 times per day  . prednisoLONE  60 mg Per Tube QAC breakfast  . senna-docusate  1 tablet Per Tube QHS  . Valproic Acid  750 mg Per Tube TID    Time spent on care of this patient: 35 mins   ELLIS,ALLISON L. , ANP   [facility] Office  [phone] Pager - Text Page per Shea Evans as per below:  On-Call/Text Page:      [email or web address]      password [email or web address]  If 7PM-7AM, please contact night-coverage [email or web address] Password [email or web address] 01/03/2014, 1:36 PM   LOS: 14 days   Exam patient and discussed assessment and plan with ANP Ebony Hail and agree with the plan. Patient with complex medical problems> 40 minutes spent in direct patient care

## 2014-01-04 DIAGNOSIS — I959 Hypotension, unspecified: Secondary | ICD-10-CM

## 2014-01-04 LAB — C4 COMPLEMENT: Complement C4, Body Fluid: 38 mg/dL (ref 10–40)

## 2014-01-04 LAB — IGG, IGA, IGM
IGM, SERUM: 32 mg/dL — AB (ref 41–251)
IgA: 419 mg/dL — ABNORMAL HIGH (ref 68–379)
IgG (Immunoglobin G), Serum: 1160 mg/dL (ref 650–1600)

## 2014-01-04 LAB — C3 COMPLEMENT: C3 Complement: 149 mg/dL (ref 90–180)

## 2014-01-05 NOTE — H&P (Signed)
[facility] History and Physical  Patient: [name]  ZOX:[id number]  DOB: 26-Jan-1966  DOS: the patient was seen and examined on 12/28/2013 PCP: Default, Provider, MD  Chief Complaint: Fever and altered mental status  HPI: [name] is a 48 y.o. male with Past medical history of developmental delay, COPD, cervical spondylosis, sleep apnea, recently admitted due to multiple falls with mild rhabdomyolysis discharged on 12/11/2013. The patient initially presented to or had a hospital. He was brought in by the nursing home staff. As per my discussion with the nursing home staff the patient was found unresponsive in his bed under routine checkup prior to his arrival to [facility] ER. In the [facility] ER the patient was found to have pinpoint pupil, with temperature of 38C, nitrates in UA, creatinine more than 8, BUN 97, anion gap 22, potassium 5.1, sodium 140, pH 7.4, PCO2 36. A CT of the head was obtained there which was normal without any acute abnormality as well as a CT of C-spine. A CT of the abdomen was showing possible pyelonephritis without any stone. Patient was given IV Zosyn, IV fluids and based on recommendation from nephrologist at the [facility] the patient was transferred to [facility] since they did not have any hemodialysis facility available over the weekend. Patient was reportedly hemodynamically stable at the time of the discussion and was able to protect his airway. On patient's arrival here he was found to have received 3 L of fluid there, patient was less responsive, continues to have pinpoint pupil which did not respond at the multiple doses of Narcan.  The patient is coming from SNF. And at his baseline dependent for most of his ADL.  Review of Systems: as mentioned in the history of present illness.  A Comprehensive review of the other systems is negative.  Past Medical History  Diagnosis Date  . Acute respiratory failure   . Hypoxemia   .  Encephalopathy, unspecified   . Pain in limb   . Hyperosmolality and/or hypernatremia   . Dehydration   . Altered mental status   . Other specific developmental learning difficulties   . Acidosis   . Gallstone ileus   . Unspecified vitamin B deficiency   . Tobacco use disorder   . Acute conjunctivitis, unspecified   . Other chronic allergic conjunctivitis   . Unspecified essential hypertension   . Cellulitis and abscess of unspecified site   . Muscle weakness (generalized)   . Cognitive communication deficit   . COPD (chronic obstructive pulmonary disease)   . Sleep apnea   . 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 Acute respiratory failure with hypoxia 09/20/2012  . Acute respiratory acidosis 09/20/2012  . Cellulitis of left lower extremity 09/20/2012  . OSA (obstructive sleep apnea) 09/20/2012  . Lumbosacral spondylosis without myelopathy 12/03/2013  . Degenerative joint disease of spine 12/03/2013  . Cervical spondylosis with myelopathy 12/03/2013   Past Surgical History  Procedure Laterality Date  . Eye surgery      cataracts  . Radiology with anesthesia N/A 12/09/2013    Procedure: MRI OF LUMBAR W/WO CONTRAST ;  Surgeon: Medication Radiologist, MD;  Location: [facility] OR;  Service: Radiology;  Laterality: N/A;   Social History:  reports that he has been smoking.  He does not have any smokeless tobacco history on file. He reports that he does not drink alcohol or use illicit drugs.  No Known Allergies  Family History  Problem Relation Age of Onset  . Lung cancer Father  deceased  . Diabetes Mother     Prior to Admission medications   Medication Sig Start Date End Date Taking? Authorizing Provider  albuterol (PROVENTIL) (5 MG/ML) 0.5% nebulizer solution Take 0.5 mLs (2.5 mg total) by nebulization every 6 (six) hours as needed for wheezing. 09/24/12  Yes Tora Kindred York, PA-C  baclofen (LIORESAL) 5 mg TABS tablet Take 5 mg by mouth 3 (three) times daily.   Yes Historical Provider,  MD  budesonide (PULMICORT) 0.25 MG/2ML nebulizer solution Take 2 mLs (0.25 mg total) by nebulization 2 (two) times daily. 09/24/12  Yes Marianne L York, PA-C  budesonide (PULMICORT) 180 MCG/ACT inhaler Inhale into the lungs 2 (two) times daily.   Yes Historical Provider, MD  cyclobenzaprine (FLEXERIL) 10 MG tablet Take 1 tablet (10 mg total) by mouth 2 (two) times daily as needed for muscle spasms. 11/28/13  Yes Vida Roller, MD  HYDROcodone-acetaminophen (NORCO/VICODIN) 5-325 MG per tablet Take 1 tablet by mouth every 4 (four) hours as needed for moderate pain. 12/02/13  Yes Burgess Amor, PA-C  ibuprofen (ADVIL,MOTRIN) 800 MG tablet Take 800 mg by mouth every 8 (eight) hours as needed.   Yes Historical Provider, MD    Physical Exam: Filed Vitals:   12/07/2013 0600 12/26/2013 0700 12/29/2013 0800 12/15/2013 1300  BP: 63/34  59/34   Pulse: 86  85   Temp:      TempSrc:      Resp: 12  11   Height:      Weight:  80.9 kg (178 lb 5.6 oz)  80.9 kg (178 lb 5.6 oz)  SpO2: 84%  82%     General: Pinpoint pupil, does not respond to verbal stimuli nor deep sternal rub, does not follow command occasionally moves extremities spontaneously Eyes: Pinpoint pupil ENT: Oral Mucosa dry. Neck: Difficult to assess JVD Cardiovascular: S1 and S2 Present, aortic systolic Murmur, Peripheral Pulses Present Respiratory: Bilateral Air entry equal and Decreased, bilateral rhonchi, Crackles, Abdomen: Bowel Sound Present, Soft  Skin: No Rash Extremities: Bilateral Pedal edema,  Neurologic: As above, reflexes are difficult to elicit  Labs on Admission:  CBC:  Recent Labs Lab 12/30/13 0313 12/31/13 0528 01/01/14 0351 01/02/14 0436 01/03/14 0417  WBC 4.9 4.6 5.3 5.4 7.7  HGB 8.9* 7.7* 7.8* 8.0* 8.8*  HCT 26.6* 24.7* 24.5* 25.2* 28.4*  MCV 97.4 101.2* 100.4* 102.9* 106.0*  PLT 136* 117* 114* 108* 138*    CMP     Component Value Date/Time   NA 141 01/03/2014 0417   K 4.9 01/03/2014 0417   CL 100 01/03/2014 0417    CO2 26 01/03/2014 0417   GLUCOSE 150* 01/03/2014 0417   BUN 59* 01/03/2014 0417   CREATININE 6.21* 01/03/2014 0417   CALCIUM 8.8 01/03/2014 0417   PROT 5.2* 12/30/2013 0313   ALBUMIN 1.7* 12/30/2013 0313   AST 25 12/30/2013 0313   ALT 34 12/30/2013 0313   ALKPHOS 68 12/30/2013 0313   BILITOT <0.2* 12/30/2013 0313   GFRNONAA 10* 01/03/2014 0417   GFRAA 11* 01/03/2014 0417    No results found for this basename: LIPASE, AMYLASE,  in the last 168 hours No results found for this basename: AMMONIA,  in the last 168 hours  No results found for this basename: CKTOTAL, CKMB, CKMBINDEX, TROPONINI,  in the last 168 hours BNP (last 3 results) No results found for this basename: PROBNP,  in the last 8760 hours  Radiological Exams on Admission: No results found.  EKG: Independently reviewed. normal sinus rhythm.  Assessment/Plan Principal Problem:   Septic shock Active Problems:   Acute respiratory failure with hypoxia   Developmental disability   Sleep apnea   Cervical spondylosis with myelopathy   Shock   Acute encephalopathy   Seizure   Hypokalemia   Acute renal failure   Macrocytic anemia   Anemia   Thrombocytopenia, unspecified   B12 deficiency   1. Septic shock Patient presented with severe hypotension not responding after 3 L of IV fluid, pinpoint pupil, not following command and responding to sternal rub. Patient has received IV Zosyn. CT is showing pyelonephritis. Patient has severe acute renal failure with serum creatinine more than 8 baseline less than 1. As per prior notes at his baseline patient has been able to communicate his needs. Patient was initially given 0.4 mg of Narcan 1 mg of Narcan despite which he did not improve. he was given normal saline bolus and his blood pressure remained soft. With this emergent consult to critical care was placed. Recommended to start the patient on peripheral Levophed, which was ordered. Patient was emergently intubated. And patient's care  was transferred to critical care services. Neurology as well as nephrology will be consulted for further workup related to patient's acute renal failure as well as acute encephalopathy.  Consults: Critical care, nephrology, neurology  DVT Prophylaxis: subcutaneous Heparin Nutrition: N.p.o.  Code Status: Full based on recent admission  Disposition: Admitted to inpatient in intensive care unit, under Dr. Tyson Alias.  Author: Lynden Oxford, MD [facility] Pager: [phone]  If 7PM-7AM, please contact night-coverage [email or web address] Password [email or web address]  **Disclaimer: This note may have been dictated with voice recognition software. Similar sounding words can inadvertently be transcribed and this note may contain transcription errors which may not have been corrected upon publication of note.**

## 2014-01-06 LAB — ANA: Anti Nuclear Antibody(ANA): NEGATIVE

## 2014-01-06 LAB — ANCA SCREEN W REFLEX TITER
Atypical p-ANCA Screen: NEGATIVE
C-ANCA SCREEN: NEGATIVE
P-ANCA SCREEN: NEGATIVE

## 2014-01-06 LAB — ANTI-RIBONUCLEIC ACID ANTIBODY: SM/RNP: NEGATIVE

## 2014-01-06 LAB — B. BURGDORFI ANTIBODIES: B BURGDORFERI AB IGG+ IGM: 0.32 {ISR}

## 2014-01-07 LAB — ACETYLCHOLINE RECEPTOR, BINDING: Acetylcholine Receptor Ab: <0.3 nmol/L (ref <=0.30)

## 2014-01-07 LAB — STRIATED MUSCLE ANTIBODY: Striated Muscle Ab: <1:40 {titer}

## 2014-01-07 NOTE — Progress Notes (Signed)
Chaplain referred by Leonia Reeves who was with pt's family yesterday. Pt's sister and her son were at bedside. Pt is unresponsive and is actively dying. Pt's sister states that pt will likely pass today. Pt's sister complimented hospital and nurses for good care pt has received. However, she does not understand how pt has declined so rapidly. Chaplain provided empathic listening as pt's sister talked about her family and her brother. Pt had never married and had lived with his parents until they passed. Pt's sister stated that people from her church had been here last evening and they had had prayer together. She thanked Orthoptist for his concern.

## 2014-01-07 NOTE — Discharge Summary (Signed)
Death Summary  [name] KPT:[id number] DOB: 1965/10/14 DOA: 01/12/14  PCP: Default, Provider, MD PCP/Office notified:   Admit date: 2014-01-12 Date of Death: 01-27-14  Final Diagnoses:  Principal Problem:   Septic shock Active Problems:   Acute respiratory failure with hypoxia   Developmental disability   Sleep apnea   Cervical spondylosis with myelopathy   Shock   Acute encephalopathy   Seizure   Hypokalemia   Acute renal failure   Macrocytic anemia   Anemia   Thrombocytopenia, unspecified   B12 deficiency   Acute respiratory failure 2nd to septic shock/recurrent with hypercarbia   Spacticity/spasms of lower extremities    Hypotension   Septic shock 2nd to pyelonephritis/recurrent abnormal urinalysis   AKI, anuric 2nd to pyelonephritis, septic shock, myelopathy and neurogenic bladder   Acute Encephalopathy 2nd to seizures, sepsis, AKI and now acute hypercarbia   Dysphagia   Hypernatremia    Hypokalemia   Thrombocytopenia   COPD    Hx of OSA   Bradycardia   Anemia of critical illness      History of present illness:  48 y/o M SNF resident with Hx of mild developmental delay, DJD, O2 dependent COPD / Tobacco Abuse, OSA, Cervical / Lumbosacral spondylosis & recent admission to [facility] for back pain & falls who presented to [facility] on 2014/01/12 with fever and AMS. Reportedly he had a worsening mental status for 3-4 days prior to admission. Work up at [facility] was remarkable for UTI, lactic acid 1.1, Na 140, K 5.1, AG 22, sr cr 8.95, Alk phos 106, CK 560, INR 1.2, WBC 9.6, Hgb 11.7. Given acute kidney injury, patient was transferred to [facility] for further care.  Upon arrival to [facility], the patient was assessed by [facility]. The MD had concerns for posturing, inability to protect his airway, and shock and PCCM was emergently consulted for evaluation. Patient was intubated and HD catheter placed by PCCM.  Pt was treated aggressively for  septic shock due to pyelonephritis. Pressors were eventually weaned and dc'd. He also required CRRT for AKI but unfortunately his renal function has not recovered and he has required PRN dialysis since then. He had failed swallowing evaluations and required feeds via tube. He finally was cleared for a dysphagia 1 diet but has not been alert enough to maintain adequate caloric intake therefore bolus tube feedings initiated. As a precaution TID bolus feeds also in place. He has also been very confused and has required restraints. He is not a good candidate for OP HD due to mentation and nonambulatory status in the setting of severe progressive functional decline. Palliative Care was consulted to discuss goals of care with the family.  Palliative has met with the family. Pt made a DNR and family deciding as to whether to change focus to comfort and thus transition to residential Hospice. (see note from 8/26 for details). Based on theor assessment this patient has had rapid functional decline since July 2015. Prior to this hospitalization he was alert and talkative and appropriate. Palliative has adjusted his pain meds and on 8/27 low dose Lyrica has been added for presumed neuropathic pain. Nephro documented 8/27 that mentation has not improved despite HD and recommend comfort focus.  After reviewing palliative note and after discussing further with Dr. Rhea Pink we agreed that patient's pre-existing neurological condition had never been clarified and before actively assuming withdrawal of care the neurological workup needs to be completed. On 8/28 neurology was reconsulted; multiple serologies  were obtained in an attempt to determine etiology of patient's neurological dysfunction and repeat radiographic studies also ordered. Nephrology had planned to stop dialysis due to futility but given plans to proceed with neurological evaluation will continue dialysis for now. In addition on the same date patient became  oversedated secondary to meds used to control pain/discomfort from his spasticity. He was found to have respiratory acidosis with profound hypercarbia and BiPAP was initiated. 8/28 was contacted late in the day by Dr. Rhea Pink (palliative care) who had spoken with the family and explain the continued deteriorating health of the patient despite escalating treatment the patient was receiving, and I was informed the family had decided to make patient comfort care.    Time: 1254  Signed:  Dia Crawford, MD [facility] [phone]

## 2014-01-07 NOTE — Progress Notes (Signed)
Patient ID: [name], male   DOB: May 20, 1965, 48 y.o.   MRN: [id number] BP 59/34  Pulse 85  Temp(Src) 96.5 F (35.8 C) (Axillary)  Resp 11  Ht  (1.575 m)  Wt 80.9 kg (178 lb 5.6 oz)  BMI 32.61 kg/m2  SpO2 82%  Patient now on comfort care measures only after rapid decline. Nothing further to offer from renal standpoint, will sign off.  Zetta Bills MD [facility]. Office # [phone] Pager # [phone] 8:14 AM

## 2014-01-07 NOTE — Progress Notes (Signed)
Pt time of death was pronounced at 1254 by two RN's Sherrlyn Hock and Dara A. Pt was on a dilaudid IV gtt for comfort. His sister was present at the time of death. She completed all paper work and was given whatever belongings left behind of the Pt. All required documents completed, MD informed of Pt's status, CDS called, along with bed placement. Pt cleaned up and provided postmortem care.

## 2014-01-07 DEATH — deceased

## 2014-01-16 LAB — EHRLICHIA ANTIBODY PANEL: E chaffeensis (HGE) Ab, IgM: <1:20 {titer}

## 2014-04-16 ENCOUNTER — Encounter (HOSPITAL_COMMUNITY): Payer: Self-pay

## 2014-12-18 IMAGING — CR DG ABD PORTABLE 1V
1 series · 1 of 1 positions shown · non-contrast
Comparison: 12/28/2013.

CLINICAL DATA: Feeding tube placement.

EXAM:
PORTABLE ABDOMEN - 1 VIEW

[AP]
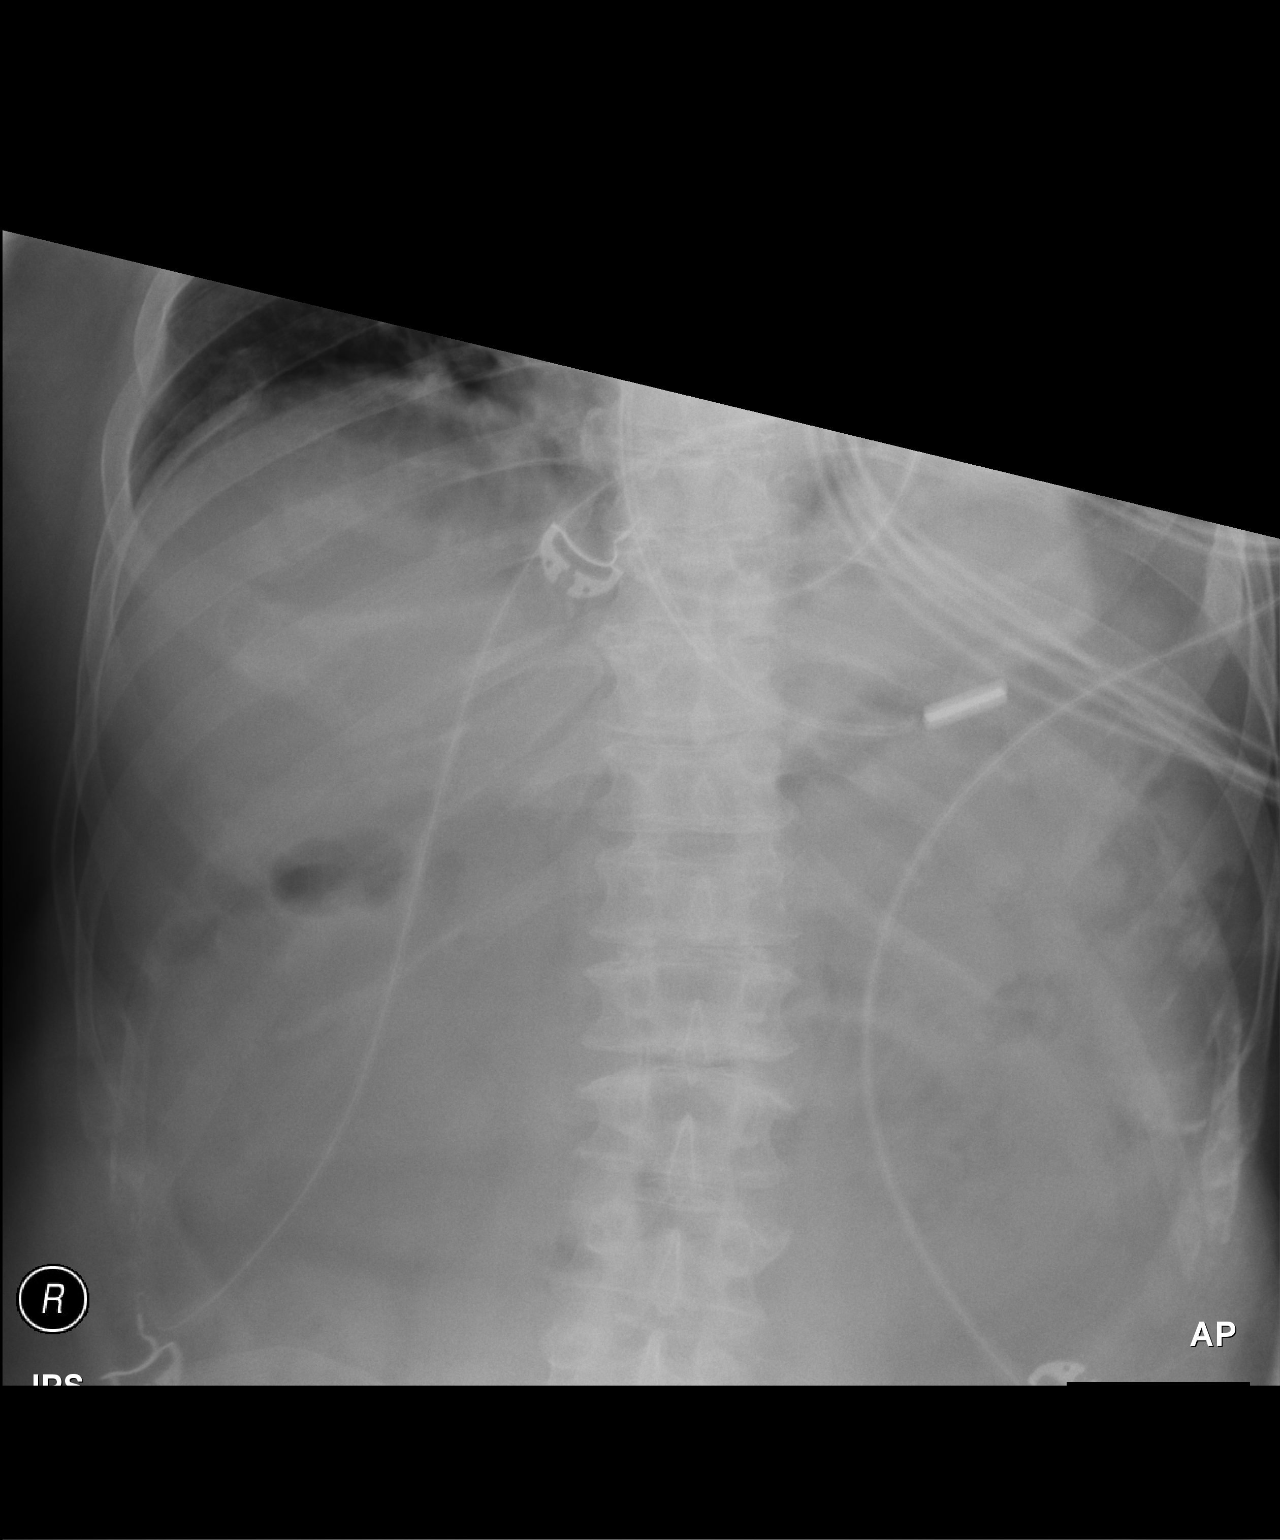

[1 of 1 positions shown; findings below may reference images not displayed]

FINDINGS: The feeding tube tip is in the fundal region of the stomach. Stable
bowel gas pattern. No definite free air.
IMPRESSION: The feeding tube tip is in the fundal region of the stomach.

## 2014-12-21 IMAGING — CT CT HEAD W/O CM
1 series · 16 of 30 positions shown, 20 images · non-contrast
Comparison: Head CT December 20, 2013 and brain MRI December 21, 2013

CLINICAL DATA: Altered mental status and equal pupils

EXAM:
CT HEAD WITHOUT CONTRAST
TECHNIQUE: Contiguous axial images were obtained from the base of the skull
through the vertex without intravenous contrast.

[Series 2: head 5.0 h30s · axial · 0.44mm/px · z∈[-147,+18]mm · 16 of 37 slices shown, 20 images]
[im 2/37  brain]
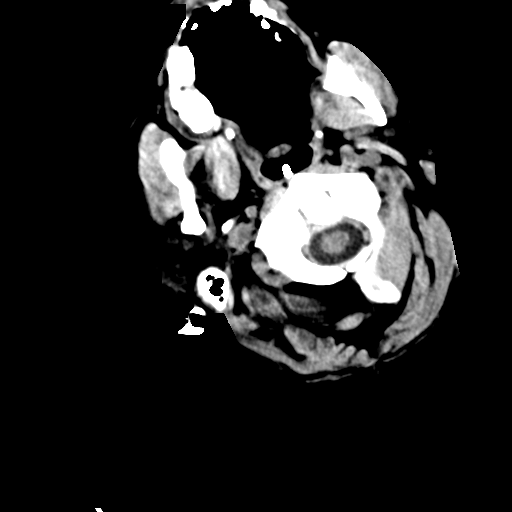
[im 2/37  bone]
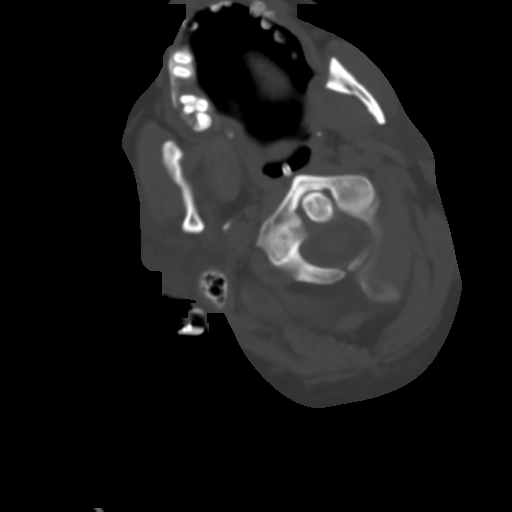
[im 4/37  brain]
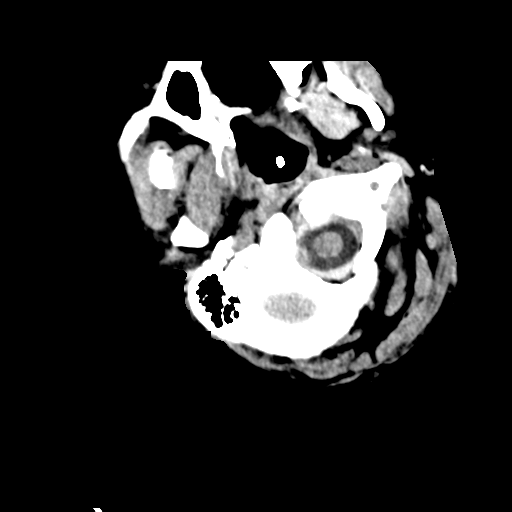
[im 7/37  brain]
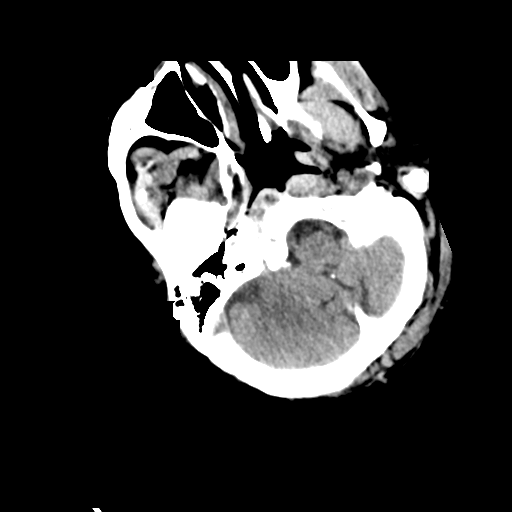
[im 9/37  brain]
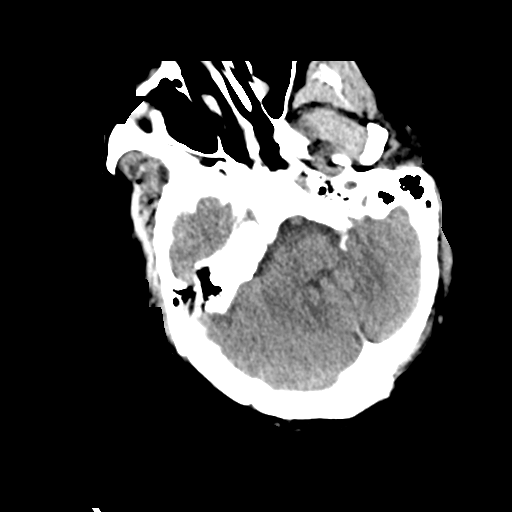
[im 10/37  brain]
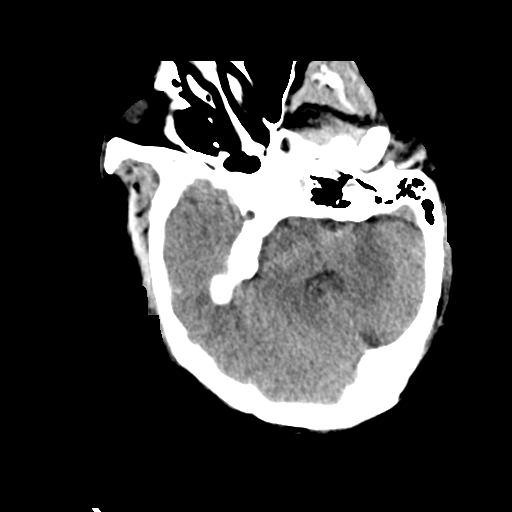
[im 10/37  bone]
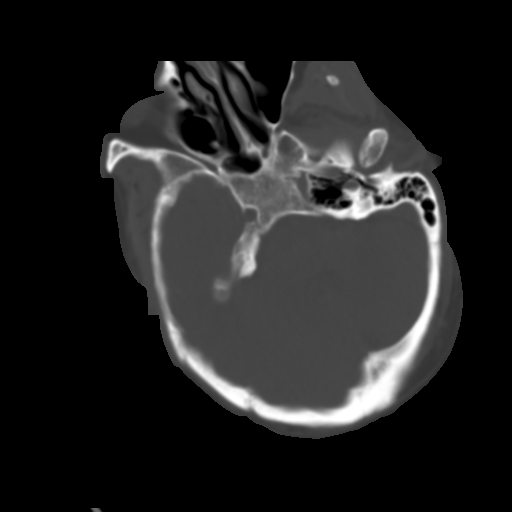
[im 13/37  brain]
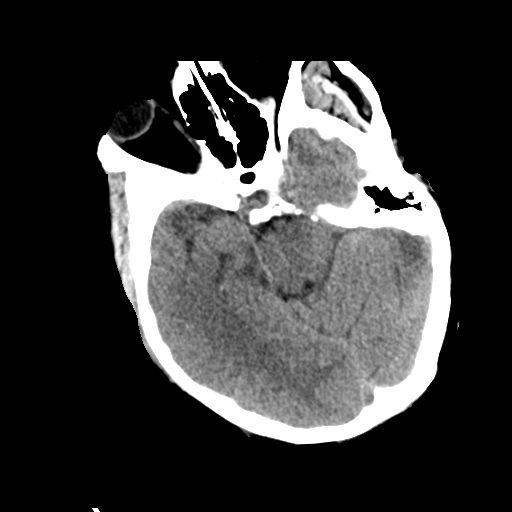
[im 15/37  brain]
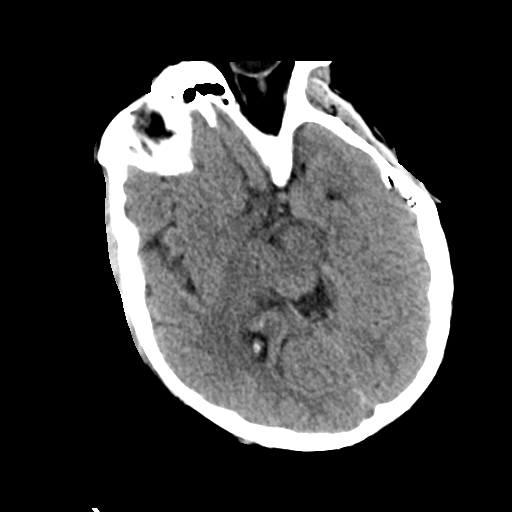
[im 18/37  brain]
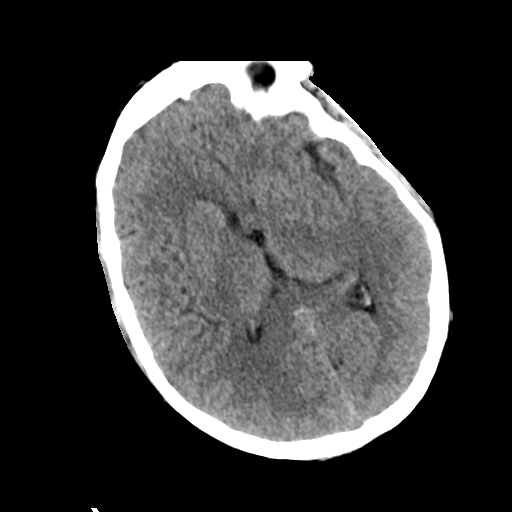
[im 19/37  brain]
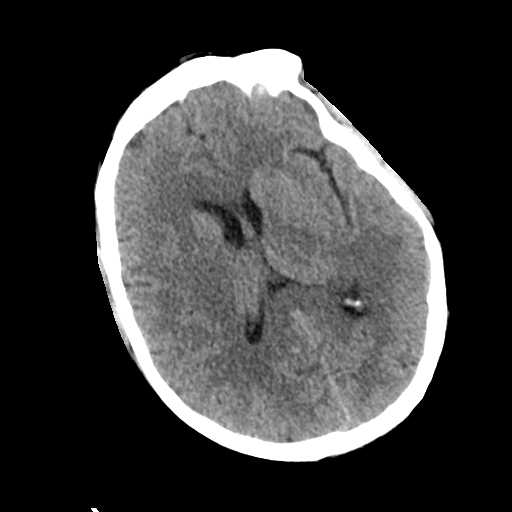
[im 19/37  bone]
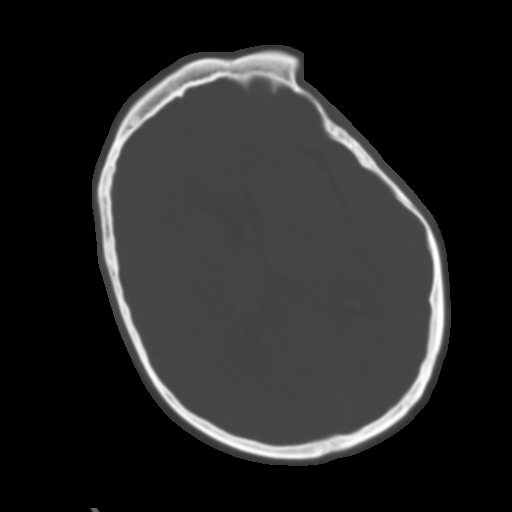
[im 22/37  brain]
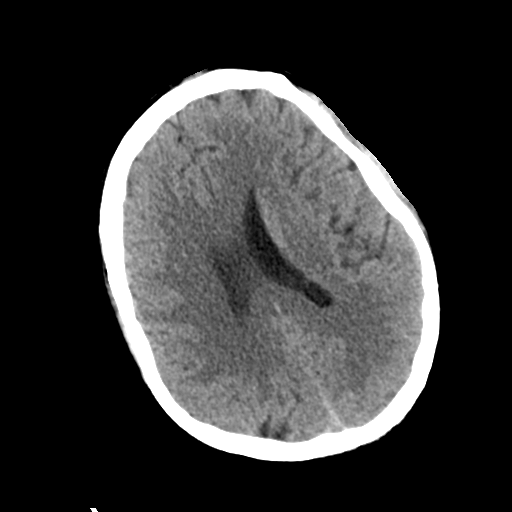
[im 24/37  brain]
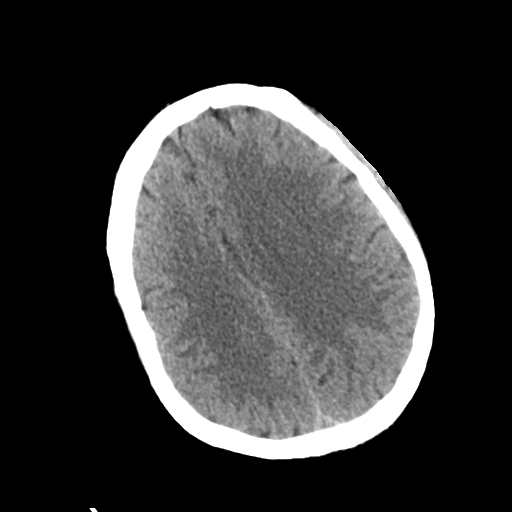
[im 27/37  brain]
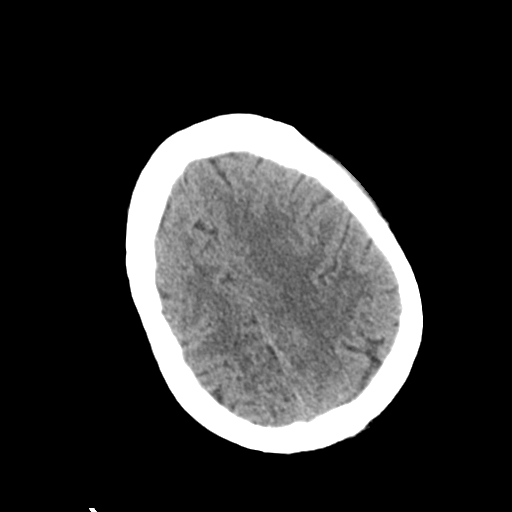
[im 28/37  brain]
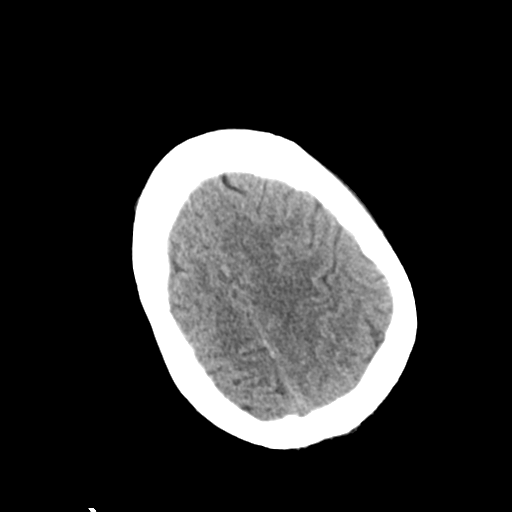
[im 28/37  bone]
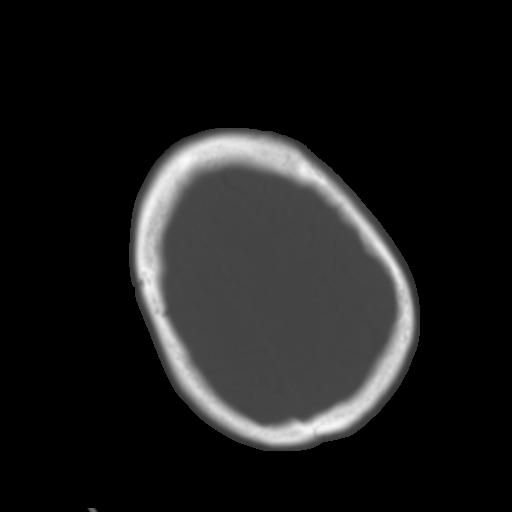
[im 30/37  brain]
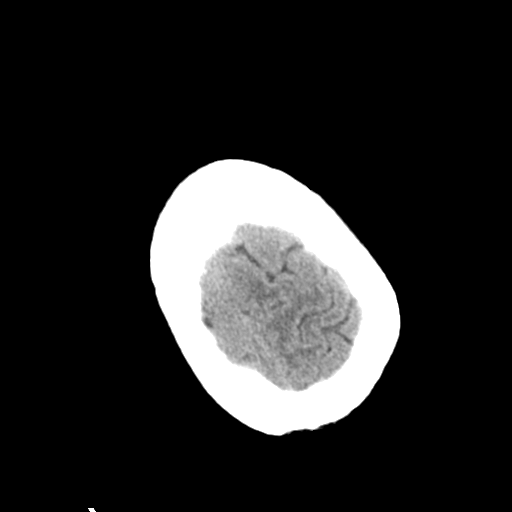
[im 33/37  brain]
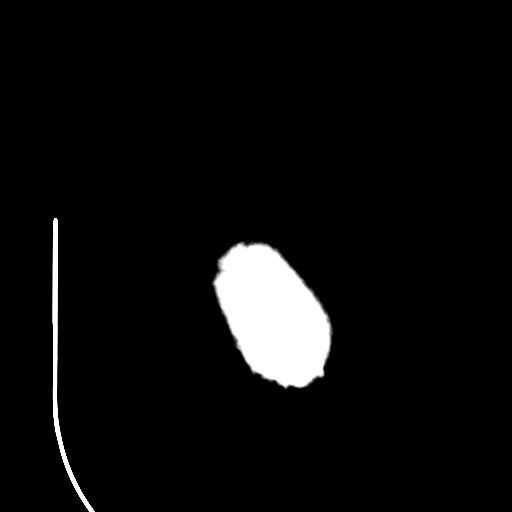
[im 35/37  brain]
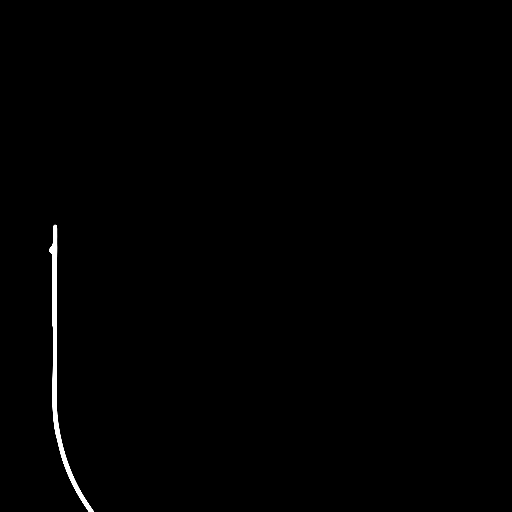

[16 of 30 positions shown; findings below may reference images not displayed]

FINDINGS: The ventricles are normal size and configuration. There is no mass,
hemorrhage, extra-axial fluid collection, or midline shift. There is
minimal small vessel disease in the centra semiovale bilaterally. No
acute appearing infarct present. No new gray-white compartment
lesion. Bony calvarium appears intact. The mastoid air cells are
clear. There are small retention cysts in the left maxillary antrum.
There is mild mucosal thickening in several ethmoid air cells
bilaterally. There is debris in the left external auditory canal.
IMPRESSION: Minimal periventricular small vessel disease. No intracranial mass,
hemorrhage, or acute appearing infarct. Mild paranasal sinus
disease. Probable cerumen in the left external auditory canal.
